# Patient Record
Sex: Male | Born: 1945 | Race: White | Hispanic: No | Marital: Married | Smoking: Current every day smoker
Health system: Southern US, Community
[De-identification: ages and names within clinical notes are randomized; demographics above are authoritative.]

## PROBLEM LIST (undated history)

## (undated) DIAGNOSIS — J449 Chronic obstructive pulmonary disease, unspecified: Secondary | ICD-10-CM

## (undated) DIAGNOSIS — I10 Essential (primary) hypertension: Secondary | ICD-10-CM

## (undated) DIAGNOSIS — I219 Acute myocardial infarction, unspecified: Secondary | ICD-10-CM

## (undated) DIAGNOSIS — R0600 Dyspnea, unspecified: Secondary | ICD-10-CM

## (undated) DIAGNOSIS — E785 Hyperlipidemia, unspecified: Secondary | ICD-10-CM

## (undated) DIAGNOSIS — M069 Rheumatoid arthritis, unspecified: Secondary | ICD-10-CM

## (undated) DIAGNOSIS — J45909 Unspecified asthma, uncomplicated: Secondary | ICD-10-CM

## (undated) DIAGNOSIS — G4730 Sleep apnea, unspecified: Secondary | ICD-10-CM

## (undated) DIAGNOSIS — E119 Type 2 diabetes mellitus without complications: Secondary | ICD-10-CM

## (undated) HISTORY — DX: Sleep apnea, unspecified: G47.30

## (undated) HISTORY — PX: CARDIAC CATHETERIZATION: SHX172

## (undated) HISTORY — PX: ELBOW ARTHROCENTESIS: SUR46

## (undated) HISTORY — PX: ROTATOR CUFF REPAIR: SHX139

## (undated) HISTORY — DX: Rheumatoid arthritis, unspecified: M06.9

## (undated) HISTORY — DX: Acute myocardial infarction, unspecified: I21.9

## (undated) HISTORY — DX: Unspecified asthma, uncomplicated: J45.909

## (undated) HISTORY — DX: Essential (primary) hypertension: I10

## (undated) HISTORY — DX: Type 2 diabetes mellitus without complications: E11.9

## (undated) HISTORY — PX: BACK SURGERY: SHX140

## (undated) HISTORY — DX: Hyperlipidemia, unspecified: E78.5

---

## 2001-05-03 ENCOUNTER — Encounter: Admission: RE | Admit: 2001-05-03 | Discharge: 2001-05-03 | Payer: Self-pay | Admitting: Neurosurgery

## 2001-05-03 ENCOUNTER — Encounter: Payer: Self-pay | Admitting: Neurosurgery

## 2001-05-17 ENCOUNTER — Encounter: Payer: Self-pay | Admitting: Neurosurgery

## 2001-05-17 ENCOUNTER — Encounter: Admission: RE | Admit: 2001-05-17 | Discharge: 2001-05-17 | Payer: Self-pay | Admitting: Neurosurgery

## 2003-02-23 ENCOUNTER — Ambulatory Visit (HOSPITAL_BASED_OUTPATIENT_CLINIC_OR_DEPARTMENT_OTHER): Admission: RE | Admit: 2003-02-23 | Discharge: 2003-02-23 | Payer: Self-pay | Admitting: Pulmonary Disease

## 2004-12-12 ENCOUNTER — Emergency Department: Payer: Self-pay | Admitting: Emergency Medicine

## 2006-02-27 ENCOUNTER — Emergency Department: Payer: Self-pay | Admitting: General Practice
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
## 2015-02-18 ENCOUNTER — Encounter: Payer: Self-pay | Admitting: Respiratory Therapy

## 2015-02-18 ENCOUNTER — Encounter: Payer: Non-veteran care | Attending: Family Medicine | Admitting: Respiratory Therapy

## 2015-02-18 VITALS — Ht 69.0 in | Wt 261.3 lb

## 2015-02-18 DIAGNOSIS — J449 Chronic obstructive pulmonary disease, unspecified: Secondary | ICD-10-CM | POA: Insufficient documentation

## 2015-02-18 DIAGNOSIS — E119 Type 2 diabetes mellitus without complications: Secondary | ICD-10-CM | POA: Insufficient documentation

## 2015-02-18 NOTE — Patient Instructions (Signed)
Patient Instructions  Patient Details  Name: Jeffrey Burgess MRN: [id number] Date of Birth: 01-11-1946 Referring Provider:  Concha Pyo, MD  Below are the personal goals you chose as well as exercise and nutrition goals. Our goal is to help you keep on track towards obtaining and maintaining your goals. We will be discussing your progress on these goals with you throughout the program.  Initial Exercise Prescription:     Initial Exercise Prescription - 02/18/15 1800    Date of Initial Exercise Prescription   Date 02/18/15   Treadmill   MPH 1   Grade 0   Minutes 10   Recumbant Bike   Level 2   RPM 40   Watts 20   Minutes 10   NuStep   Level 2   Watts 40   Minutes 10   Arm Ergometer   Level 1   Watts 10   Minutes 10   Recumbant Elliptical   Level 2   RPM 40   Watts 20   Minutes 10   REL-XR   Level 2   Watts 40   Minutes 10   Biostep-RELP   Level 2   Watts 40   Minutes 10   Prescription Details   Frequency (times per week) 3   Duration Progress to 30 minutes of continuous aerobic without signs/symptoms of physical distress   Intensity   THRR REST +  30   Ratings of Perceived Exertion 11-15   Perceived Dyspnea 2-4   Progression Continue progressive overload as per policy without signs/symptoms or physical distress.   Resistance Training   Training Prescription Yes   Weight 2   Reps 10-15      Exercise Goals: Frequency: Be able to perform aerobic exercise three times per week working toward 3-5 days per week.  Intensity: Work with a perceived exertion of 11 (fairly light) - 15 (hard) as tolerated. Follow your new exercise prescription and watch for changes in prescription as you progress with the program. Changes will be reviewed with you when they are made.  Duration: You should be able to do 30 minutes of continuous aerobic exercise in addition to a 5 minute warm-up and a 5 minute cool-down routine.  Nutrition Goals: Your personal nutrition goals will  be established when you do your nutrition analysis with the dietician.  The following are nutrition guidelines to follow: Cholesterol < 200mg /day Sodium < 1500mg /day Fiber: Men over 50 yrs - 30 grams per day  Personal Goals:     Personal Goals and Risk Factors at Admission - 02/18/15 1340    Personal Goals and Risk Factors on Admission    Weight Management Yes   Intervention Learn and follow the exercise and diet guidelines while in the program. Utilize the nutrition and education classes to help gain knowledge of the diet and exercise expectations in the program  Jeffrey Burgess is interested in meeting the dietitian. His wife does all the cooking - they like vegtables and salads. He does not eat much sweets, but he does not watch his portion sizes.   Admit Weight 260 lb (117.935 kg)   Goal Weight 200 lb (90.719 kg)   Increase Aerobic Exercise and Physical Activity Yes   Intervention While in program, learn and follow the exercise prescription taught. Start at a low level workload and increase workload after able to maintain previous level for 30 minutes. Increase time before increasing intensity.  Jeffrey Burgess wants to improve his exercise capacity. He likes to  do yardwork and wood working.   Quit Smoking Yes   Number of packs per day 2ppd for 73yrs; he quit a week ago - can not use chantix and uses no other nicotine aid.   Intervention Utilize your health care professional team to help with smoking cessation while in the program. Your doctor can prescribe medications to aid in cessation. The program can provide information and counseling as needed.   Understand more about Heart/Pulmonary Disease. Yes  Jeffrey Burgess would like to learn more about COPD and the daily management of the disease.   Intervention While in program utilize professionals for any questions, and attend the education sessions. Great websites to use are www.americanheart.org or www.lung.org for reliable information.  Reviewed Jeffrey  Burgess's MDIs: Albuterol, Spiriva Respimat, and Symbicort. Instructed on aerochamber and gave him a new chamber.   Improve shortness of breath with ADL's Yes   Intervention While in program, learn and follow the exercise prescription taught. Start at a low level workload and increase workload ad advised by the exercise physiologist. Increase time before increasing intensity.  Jeffrey Burgess would like to have a better understanding of his shortness of breath and less fear with it.    Develop more efficient breathing techniques such as purse lipped breathing and diaphragmatic breathing; and practicing self-pacing with activity Yes   Intervention While in program, learn and utilize the specific breathing techniques taught to you. Continue to practice and use the techniques as needed.  Instructed on PLB and it's benefits.   Increase knowledge of respiratory medications and ability to use respiratory devices properly.  Yes   Intervention While in program, learn to administer MDI, nebulizer, and spacer properly.;Learn to take respiratory medicine as ordered.;While in program, learn to Clean MDI, nebulizers, and spacers properly.  Reviewed MDI's and gave Jeffrey Burgess a spacer.   Diabetes Yes   Goal Blood glucose control identified by blood glucose values, HgbA1C. Participant verbalizes understanding of the signs/symptoms of hyper/hypo glycemia, proper foot care and importance of medication and nutrition plan for blood glucose control.   Intervention Provide nutrition & aerobic exercise along with prescribed medications to achieve blood glucose in normal ranges: Fasting 65-99 mg/dL   Hypertension Yes   Goal Participant will see blood pressure controlled within the values of 140/69mm/Hg or within value directed by their physician.   Intervention Provide nutrition & aerobic exercise along with prescribed medications to achieve BP 140/90 or less.      Tobacco Use Initial Evaluation: History  Smoking status  .  Former Smoker -- 1.00 packs/day for 57 years  . Types: Cigarettes  . Quit date: 02/10/2015  Smokeless tobacco  . 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

## 2015-02-18 NOTE — Progress Notes (Signed)
Pulmonary Individual Treatment Plan  Patient Details  Name: Jeffrey Burgess MRN: [id number] Date of Birth: 1945/11/11 Referring Provider:  Concha Pyo, MD  Initial Encounter Date: Date: 02/18/15  Visit Diagnosis: COPD, mild (HCC)  Patient's Home Medications on Admission: No current outpatient prescriptions on file.  Past Medical History: Past Medical History  Diagnosis Date  . Asthma   . Rheumatoid arthritis (HCC)   . Hyperlipidemia   . Hypertension   . Sleep apnea   . Myocardial infarction (HCC)   . Diabetes (HCC)     Tobacco Use: History  Smoking status  . Former Smoker -- 1.00 packs/day for 57 years  . Types: Cigarettes  . Quit date: 02/10/2015  Smokeless tobacco  . Former Emergency planning/management officer: Recent Review Flowsheet Data    There is no flowsheet data to display.       ADL UCSD:     ADL UCSD      02/18/15 1340       ADL UCSD   ADL Phase Entry     SOB Score total 65     Rest 1     Walk 2     Stairs 4     Bath 2     Dress 2     Shop 4         Pulmonary Function Assessment:     Pulmonary Function Assessment - 02/18/15 1340    Initial Spirometry Results   FVC% 62 %   FEV1% 43 %   FEV1/FVC Ratio 55.33   Post Bronchodilator Spirometry Results   FVC% 54 %   FEV1% 62 %   FEV1/FVC Ratio 71.51   Breath   Bilateral Breath Sounds Clear;Decreased   Shortness of Breath Yes;Limiting activity;Fear of Shortness of Breath      Exercise Target Goals: Date: 02/18/15  Exercise Program Goal: Individual exercise prescription set with THRR, safety & activity barriers. Participant demonstrates ability to understand and report RPE using BORG scale, to self-measure pulse accurately, and to acknowledge the importance of the exercise prescription.  Exercise Prescription Goal: Starting with aerobic activity 30 plus minutes a day, 3 days per week for initial exercise prescription. Provide home exercise prescription and guidelines that participant acknowledges  understanding prior to discharge.  Activity Barriers & Risk Stratification:     Activity Barriers & Risk Stratification - 02/18/15 1340    Activity Barriers & Risk Stratification   Activity Barriers Assistive Device;Deconditioning;Muscular Weakness;Balance Concerns   Risk Stratification Moderate      6 Minute Walk:     6 Minute Walk      02/18/15 1817       6 Minute Walk   Phase Initial     Distance 802 feet     Walk Time 6 minutes     Resting HR 88 bpm     Resting BP 136/72 mmHg     Max Ex. HR 86 bpm     Max Ex. BP 146/72 mmHg     RPE 14     Perceived Dyspnea  3     Symptoms No        Initial Exercise Prescription:     Initial Exercise Prescription - 02/18/15 1800    Date of Initial Exercise Prescription   Date 02/18/15   Treadmill   MPH 1   Grade 0   Minutes 10   Recumbant Bike   Level 2   RPM 40   Watts 20   Minutes 10  NuStep   Level 2   Watts 40   Minutes 10   Arm Ergometer   Level 1   Watts 10   Minutes 10   Recumbant Elliptical   Level 2   RPM 40   Watts 20   Minutes 10   REL-XR   Level 2   Watts 40   Minutes 10   Biostep-RELP   Level 2   Watts 40   Minutes 10   Prescription Details   Frequency (times per week) 3   Duration Progress to 30 minutes of continuous aerobic without signs/symptoms of physical distress   Intensity   THRR REST +  30   Ratings of Perceived Exertion 11-15   Perceived Dyspnea 2-4   Progression Continue progressive overload as per policy without signs/symptoms or physical distress.   Resistance Training   Training Prescription Yes   Weight 2   Reps 10-15      Exercise Prescription Changes:   Discharge Exercise Prescription (Final Exercise Prescription Changes):    Nutrition:  Target Goals: Understanding of nutrition guidelines, daily intake of sodium 1500mg , cholesterol 200mg , calories 30% from fat and 7% or less from saturated fats, daily to have 5 or more servings of fruits and  vegetables.  Biometrics:     Pre Biometrics - 02/18/15 1819    Pre Biometrics   Height 5\' 9"  (1.753 m)   Weight 261 lb 4.8 oz (118.525 kg)   Waist Circumference 48 inches   Hip Circumference 53.5 inches   Waist to Hip Ratio 0.9 %   BMI (Calculated) 38.7       Nutrition Therapy Plan and Nutrition Goals:     Nutrition Therapy & Goals - 02/18/15 1340    Nutrition Therapy   Diet Mr Chakraborty is interested in meeting the dietitian. His wife does all the cooking - they like vegtables and salads. He does not eat much sweets, but he does not watch his portion sizes.      Nutrition Discharge: Rate Your Plate Scores:   Psychosocial: Target Goals: Acknowledge presence or absence of depression, maximize coping skills, provide positive support system. Participant is able to verbalize types and ability to use techniques and skills needed for reducing stress and depression.  Initial Review & Psychosocial Screening:     Initial Psych Review & Screening - 02/18/15 1340    Family Dynamics   Good Support System? Yes   Comments Mr Lelli has good family support. He states he misses the activity he use to do before the COPD, but he is not depressed.   Barriers   Psychosocial barriers to participate in program There are no identifiable barriers or psychosocial needs.;The patient should benefit from training in stress management and relaxation.   Screening Interventions   Interventions Encouraged to exercise      Quality of Life Scores:     Quality of Life - 02/18/15 1340    Quality of Life Scores   Health/Function Pre 13.13 %   Socioeconomic Pre 28.44 %   Psych/Spiritual Pre 30 %   Family Pre 25.2 %   GLOBAL Pre 21.49 %      PHQ-9:     Recent Review Flowsheet Data    Depression screen [facility] 2/9 02/18/2015   Decreased Interest 0   Down, Depressed, Hopeless 0   PHQ - 2 Score 0      Psychosocial Evaluation and Intervention:   Psychosocial  Re-Evaluation:  Education: Education Goals: Education classes will be provided  on a weekly basis, covering required topics. Participant will state understanding/return demonstration of topics presented.  Learning Barriers/Preferences:     Learning Barriers/Preferences - 02/18/15 1340    Learning Barriers/Preferences   Learning Barriers None   Learning Preferences Group Instruction;Individual Instruction;Pictoral;Skilled Demonstration;Written Material;Verbal Instruction;Video      Education Topics: Initial Evaluation Education: - Verbal, written and demonstration of respiratory meds, RPE/PD scales, oximetry and breathing techniques. Instruction on use of nebulizers and MDIs: cleaning and proper use, rinsing mouth with steroid doses and importance of monitoring MDI activations.          Pulmonary Rehab from 02/18/2015 in [facility]   Date  02/18/15   Educator  LB   Instruction Review Code  2- meets goals/outcomes      General Nutrition Guidelines/Fats and Fiber: -Group instruction provided by verbal, written material, models and posters to present the general guidelines for heart healthy nutrition. Gives an explanation and review of dietary fats and fiber.   Controlling Sodium/Reading Food Labels: -Group verbal and written material supporting the discussion of sodium use in heart healthy nutrition. Review and explanation with models, verbal and written materials for utilization of the food label.   Exercise Physiology & Risk Factors: - Group verbal and written instruction with models to review the exercise physiology of the cardiovascular system and associated critical values. Details cardiovascular disease risk factors and the goals associated with each risk factor.   Aerobic Exercise & Resistance Training: - Gives group verbal and written discussion on the health impact of inactivity. On the components of aerobic and resistive training  programs and the benefits of this training and how to safely progress through these programs.   Flexibility, Balance, General Exercise Guidelines: - Provides group verbal and written instruction on the benefits of flexibility and balance training programs. Provides general exercise guidelines with specific guidelines to those with heart or lung disease. Demonstration and skill practice provided.   Stress Management: - Provides group verbal and written instruction about the health risks of elevated stress, cause of high stress, and healthy ways to reduce stress.   Depression: - Provides group verbal and written instruction on the correlation between heart/lung disease and depressed mood, treatment options, and the stigmas associated with seeking treatment.   Exercise & Equipment Safety: - Individual verbal instruction and demonstration of equipment use and safety with use of the equipment.   Infection Prevention: - Provides verbal and written material to individual with discussion of infection control including proper hand washing and proper equipment cleaning during exercise session.      Pulmonary Rehab from 02/18/2015 in [facility]   Date  02/18/15   Educator  LB   Instruction Review Code  2- meets goals/outcomes      Falls Prevention: - Provides verbal and written material to individual with discussion of falls prevention and safety.      Pulmonary Rehab from 02/18/2015 in [facility]   Date  02/18/15   Educator  LB   Instruction Review Code  2- meets goals/outcomes      Diabetes: - Individual verbal and written instruction to review signs/symptoms of diabetes, desired ranges of glucose level fasting, after meals and with exercise. Advice that pre and post exercise glucose checks will be done for 3 sessions at entry of program.   Chronic Lung Diseases: - Group verbal and written instruction to  review new updates, new respiratory medications, new advancements in  procedures and treatments. Provide informative websites and "800" numbers of self-education.   Lung Procedures: - Group verbal and written instruction to describe testing methods done to diagnose lung disease. Review the outcome of test results. Describe the treatment choices: Pulmonary Function Tests, ABGs and oximetry.   Energy Conservation: - Provide group verbal and written instruction for methods to conserve energy, plan and organize activities. Instruct on pacing techniques, use of adaptive equipment and posture/positioning to relieve shortness of breath.   Triggers: - Group verbal and written instruction to review types of environmental controls: home humidity, furnaces, filters, dust mite/pet prevention, HEPA vacuums. To discuss weather changes, air quality and the benefits of nasal washing.   Exacerbations: - Group verbal and written instruction to provide: warning signs, infection symptoms, calling MD promptly, preventive modes, and value of vaccinations. Review: effective airway clearance, coughing and/or vibration techniques. Create an Sport and exercise psychologist.   Oxygen: - Individual and group verbal and written instruction on oxygen therapy. Includes supplement oxygen, available portable oxygen systems, continuous and intermittent flow rates, oxygen safety, concentrators, and Medicare reimbursement for oxygen.   Respiratory Medications: - Group verbal and written instruction to review medications for lung disease. Drug class, frequency, complications, importance of spacers, rinsing mouth after steroid MDI's, and proper cleaning methods for nebulizers.      Pulmonary Rehab from 02/18/2015 in [facility]   Date  02/18/15   Educator  LB   Instruction Review Code  2- meets goals/outcomes      AED/CPR: - Group verbal and written instruction with the use of models to demonstrate the  basic use of the AED with the basic ABC's of resuscitation.   Breathing Retraining: - Provides individuals verbal and written instruction on purpose, frequency, and proper technique of diaphragmatic breathing and pursed-lipped breathing. Applies individual practice skills.      Pulmonary Rehab from 02/18/2015 in [facility]   Date  02/18/15   Educator  LB   Instruction Review Code  2- meets goals/outcomes      Anatomy and Physiology of the Lungs: - Group verbal and written instruction with the use of models to provide basic lung anatomy and physiology related to function, structure and complications of lung disease.   Heart Failure: - Group verbal and written instruction on the basics of heart failure: signs/symptoms, treatments, explanation of ejection fraction, enlarged heart and cardiomyopathy.   Sleep Apnea: - Individual verbal and written instruction to review Obstructive Sleep Apnea. Review of risk factors, methods for diagnosing and types of masks and machines for OSA.      Pulmonary Rehab from 02/18/2015 in [facility]   Date  02/18/15   Educator  LB   Instruction Review Code  2- meets goals/outcomes      Anxiety: - Provides group, verbal and written instruction on the correlation between heart/lung disease and anxiety, treatment options, and management of anxiety.   Relaxation: - Provides group, verbal and written instruction about the benefits of relaxation for patients with heart/lung disease. Also provides patients with examples of relaxation techniques.   Knowledge Questionnaire Score:     Knowledge Questionnaire Score - 02/18/15 1340    Knowledge Questionnaire Score   Pre Score -2      Personal Goals and Risk Factors at Admission:     Personal Goals and Risk Factors at Admission - 02/18/15 1340    Personal Goals and Risk Factors on Admission    Weight  Management Yes    Intervention Learn and follow the exercise and diet guidelines while in the program. Utilize the nutrition and education classes to help gain knowledge of the diet and exercise expectations in the program  Mr Zeiders is interested in meeting the dietitian. His wife does all the cooking - they like vegtables and salads. He does not eat much sweets, but he does not watch his portion sizes.   Admit Weight 260 lb (117.935 kg)   Goal Weight 200 lb (90.719 kg)   Increase Aerobic Exercise and Physical Activity Yes   Intervention While in program, learn and follow the exercise prescription taught. Start at a low level workload and increase workload after able to maintain previous level for 30 minutes. Increase time before increasing intensity.  Mr Teegarden wants to improve his exercise capacity. He likes to do yardwork and wood working.   Quit Smoking Yes   Number of packs per day 2ppd for 80yrs; he quit a week ago - can not use chantix and uses no other nicotine aid.   Intervention Utilize your health care professional team to help with smoking cessation while in the program. Your doctor can prescribe medications to aid in cessation. The program can provide information and counseling as needed.   Understand more about Heart/Pulmonary Disease. Yes  Mr Jackson would like to learn more about COPD and the daily management of the disease.   Intervention While in program utilize professionals for any questions, and attend the education sessions. Great websites to use are www.americanheart.org or www.lung.org for reliable information.  Reviewed Mr Chinn's MDIs: Albuterol, Spiriva Respimat, and Symbicort. Instructed on aerochamber and gave him a new chamber.   Improve shortness of breath with ADL's Yes   Intervention While in program, learn and follow the exercise prescription taught. Start at a low level workload and increase workload ad advised by the exercise physiologist. Increase time before increasing intensity.   Mr Odette would like to have a better understanding of his shortness of breath and less fear with it.    Develop more efficient breathing techniques such as purse lipped breathing and diaphragmatic breathing; and practicing self-pacing with activity Yes   Intervention While in program, learn and utilize the specific breathing techniques taught to you. Continue to practice and use the techniques as needed.  Instructed on PLB and it's benefits.   Increase knowledge of respiratory medications and ability to use respiratory devices properly.  Yes   Intervention While in program, learn to administer MDI, nebulizer, and spacer properly.;Learn to take respiratory medicine as ordered.;While in program, learn to Clean MDI, nebulizers, and spacers properly.  Reviewed MDI's and gave Mr Lutes a spacer.   Diabetes Yes   Goal Blood glucose control identified by blood glucose values, HgbA1C. Participant verbalizes understanding of the signs/symptoms of hyper/hypo glycemia, proper foot care and importance of medication and nutrition plan for blood glucose control.   Intervention Provide nutrition & aerobic exercise along with prescribed medications to achieve blood glucose in normal ranges: Fasting 65-99 mg/dL   Hypertension Yes   Goal Participant will see blood pressure controlled within the values of 140/83mm/Hg or within value directed by their physician.   Intervention Provide nutrition & aerobic exercise along with prescribed medications to achieve BP 140/90 or less.      Personal Goals and Risk Factors Review:    Personal Goals Discharge (Final Personal Goals and Risk Factors Review):    ITP Comments:   Comments:

## 2015-02-19 NOTE — Progress Notes (Signed)
Pulmonary Individual Treatment Plan  Patient Details  Name: Jeffrey Burgess MRN: [id number] Date of Birth: Mar 07, 1945 Referring Provider:  Concha Pyo, MD; [facility]  Initial Encounter Date: Date: 02/18/15  Visit Diagnosis: COPD, mild (HCC) - Plan: PULMONARY REHAB 30 DAY REVIEW  Patient's Home Medications on Admission:  Current outpatient prescriptions:    acetaminophen (TYLENOL) 500 MG tablet, Take 500 mg by mouth., Disp: , Rfl:    albuterol (PROAIR HFA) 108 (90 Base) MCG/ACT inhaler, Inhale 2 puffs into the lungs., Disp: , Rfl:    aspirin EC 81 MG tablet, Take 81 mg by mouth., Disp: , Rfl:    beclomethasone (BECONASE-AQ) 42 MCG/SPRAY nasal spray, Place 2 sprays into the nose., Disp: , Rfl:    budesonide-formoterol (SYMBICORT) 160-4.5 MCG/ACT inhaler, Inhale 2 puffs into the lungs., Disp: , Rfl:    buPROPion (WELLBUTRIN) 100 MG tablet, Take 100 mg by mouth., Disp: , Rfl:    busPIRone (BUSPAR) 10 MG tablet, Take 10 mg by mouth., Disp: , Rfl:    Cholecalciferol (VITAMIN D3 SUPER STRENGTH) 2000 units TABS, Take 2,000 Units by mouth., Disp: , Rfl:    docusate sodium (STOOL SOFTENER) 100 MG capsule, Take 100 mg by mouth., Disp: , Rfl:    fluticasone (FLONASE) 50 MCG/ACT nasal spray, Place 1 spray into the nose., Disp: , Rfl:    insulin aspart protamine - aspart (NOVOLOG 70/30 MIX) (70-30) 100 UNIT/ML FlexPen, Inject 18 Units into the skin., Disp: , Rfl:    metFORMIN (GLUCOPHAGE) 500 MG tablet, Take 500 mg by mouth., Disp: , Rfl:    metoprolol tartrate (LOPRESSOR) 25 MG tablet, Take 25 mg by mouth., Disp: , Rfl:    montelukast (SINGULAIR) 10 MG tablet, Take 10 mg by mouth., Disp: , Rfl:    nitroGLYCERIN (NITROSTAT) 0.4 MG SL tablet, Place 0.4 mg under the tongue., Disp: , Rfl:    Omega-3 1000 MG CAPS, Take 2 g by mouth., Disp: , Rfl:    pramipexole (MIRAPEX) 0.5 MG tablet, Take 0.5 mg by mouth., Disp: , Rfl:    pregabalin (LYRICA) 100 MG capsule, Take 100 mg by mouth., Disp: ,  Rfl:    ranitidine (ZANTAC) 150 MG capsule, Take 150 mg by mouth., Disp: , Rfl:    rosuvastatin (CRESTOR) 20 MG tablet, Take 20 mg by mouth., Disp: , Rfl:    tiotropium (SPIRIVA) 18 MCG inhalation capsule, Place 18 mcg into inhaler and inhale., Disp: , Rfl:    VITAMIN B COMPLEX-C CAPS, Take 1 tablet by mouth., Disp: , Rfl:    vitamin E 400 UNIT capsule, Take 400 Units by mouth., Disp: , Rfl:   Past Medical History: Past Medical History  Diagnosis Date   Asthma    Rheumatoid arthritis (HCC)    Hyperlipidemia    Hypertension    Sleep apnea    Myocardial infarction (HCC)    Diabetes (HCC)     Tobacco Use: History  Smoking status   Former Smoker -- 1.00 packs/day for 57 years   Types: Cigarettes   Quit date: 02/10/2015  Smokeless tobacco   Former Neurosurgeon    Labs: Recent Review Flowsheet Data    There is no flowsheet data to display.       ADL UCSD:     ADL UCSD      02/18/15 1340       ADL UCSD   ADL Phase Entry     SOB Score total 65     Rest 1     Walk  2     Stairs 4     Bath 2     Dress 2     Shop 4         Pulmonary Function Assessment:     Pulmonary Function Assessment - 02/18/15 1340    Initial Spirometry Results   FVC% 62 %   FEV1% 43 %   FEV1/FVC Ratio 55.33   Post Bronchodilator Spirometry Results   FVC% 54 %   FEV1% 62 %   FEV1/FVC Ratio 71.51   Breath   Bilateral Breath Sounds Clear;Decreased   Shortness of Breath Yes;Limiting activity;Fear of Shortness of Breath      Exercise Target Goals: Date: 02/18/15  Exercise Program Goal: Individual exercise prescription set with THRR, safety & activity barriers. Participant demonstrates ability to understand and report RPE using BORG scale, to self-measure pulse accurately, and to acknowledge the importance of the exercise prescription.  Exercise Prescription Goal: Starting with aerobic activity 30 plus minutes a day, 3 days per week for initial exercise prescription. Provide  home exercise prescription and guidelines that participant acknowledges understanding prior to discharge.  Activity Barriers & Risk Stratification:     Activity Barriers & Risk Stratification - 02/18/15 1340    Activity Barriers & Risk Stratification   Activity Barriers Assistive Device;Deconditioning;Muscular Weakness;Balance Concerns   Risk Stratification Moderate      6 Minute Walk:     6 Minute Walk      02/18/15 1817       6 Minute Walk   Phase Initial     Distance 802 feet     Walk Time 6 minutes     Resting HR 88 bpm     Resting BP 136/72 mmHg     Max Ex. HR 86 bpm     Max Ex. BP 146/72 mmHg     RPE 14     Perceived Dyspnea  3     Symptoms No        Initial Exercise Prescription:     Initial Exercise Prescription - 02/18/15 1800    Date of Initial Exercise Prescription   Date 02/18/15   Treadmill   MPH 1   Grade 0   Minutes 10   Recumbant Bike   Level 2   RPM 40   Watts 20   Minutes 10   NuStep   Level 2   Watts 40   Minutes 10   Arm Ergometer   Level 1   Watts 10   Minutes 10   Recumbant Elliptical   Level 2   RPM 40   Watts 20   Minutes 10   REL-XR   Level 2   Watts 40   Minutes 10   Biostep-RELP   Level 2   Watts 40   Minutes 10   Prescription Details   Frequency (times per week) 3   Duration Progress to 30 minutes of continuous aerobic without signs/symptoms of physical distress   Intensity   THRR REST +  30   Ratings of Perceived Exertion 11-15   Perceived Dyspnea 2-4   Progression Continue progressive overload as per policy without signs/symptoms or physical distress.   Resistance Training   Training Prescription Yes   Weight 2   Reps 10-15      Exercise Prescription Changes:   Discharge Exercise Prescription (Final Exercise Prescription Changes):    Nutrition:  Target Goals: Understanding of nutrition guidelines, daily intake of sodium 1500mg , cholesterol 200mg , calories 30% from fat  and 7% or less from  saturated fats, daily to have 5 or more servings of fruits and vegetables.  Biometrics:     Pre Biometrics - 02/18/15 1819    Pre Biometrics   Height 5\' 9"  (1.753 m)   Weight 261 lb 4.8 oz (118.525 kg)   Waist Circumference 48 inches   Hip Circumference 53.5 inches   Waist to Hip Ratio 0.9 %   BMI (Calculated) 38.7       Nutrition Therapy Plan and Nutrition Goals:     Nutrition Therapy & Goals - 02/18/15 1340    Nutrition Therapy   Diet Mr Durnan is interested in meeting the dietitian. His wife does all the cooking - they like vegtables and salads. He does not eat much sweets, but he does not watch his portion sizes.      Nutrition Discharge: Rate Your Plate Scores:   Psychosocial: Target Goals: Acknowledge presence or absence of depression, maximize coping skills, provide positive support system. Participant is able to verbalize types and ability to use techniques and skills needed for reducing stress and depression.  Initial Review & Psychosocial Screening:     Initial Psych Review & Screening - 02/18/15 1340    Family Dynamics   Good Support System? Yes   Comments Mr Sthilaire has good family support. He states he misses the activity he use to do before the COPD, but he is not depressed.   Barriers   Psychosocial barriers to participate in program There are no identifiable barriers or psychosocial needs.;The patient should benefit from training in stress management and relaxation.   Screening Interventions   Interventions Encouraged to exercise      Quality of Life Scores:     Quality of Life - 02/18/15 1340    Quality of Life Scores   Health/Function Pre 13.13 %   Socioeconomic Pre 28.44 %   Psych/Spiritual Pre 30 %   Family Pre 25.2 %   GLOBAL Pre 21.49 %      PHQ-9:     Recent Review Flowsheet Data    Depression screen [facility] 2/9 02/18/2015   Decreased Interest 0   Down, Depressed, Hopeless 0   PHQ - 2 Score 0      Psychosocial Evaluation and  Intervention:   Psychosocial Re-Evaluation:  Education: Education Goals: Education classes will be provided on a weekly basis, covering required topics. Participant will state understanding/return demonstration of topics presented.  Learning Barriers/Preferences:     Learning Barriers/Preferences - 02/18/15 1340    Learning Barriers/Preferences   Learning Barriers None   Learning Preferences Group Instruction;Individual Instruction;Pictoral;Skilled Demonstration;Written Material;Verbal Instruction;Video      Education Topics: Initial Evaluation Education: - Verbal, written and demonstration of respiratory meds, RPE/PD scales, oximetry and breathing techniques. Instruction on use of nebulizers and MDIs: cleaning and proper use, rinsing mouth with steroid doses and importance of monitoring MDI activations.          Pulmonary Rehab from 02/18/2015 in [facility]   Date  02/18/15   Educator  LB   Instruction Review Code  2- meets goals/outcomes      General Nutrition Guidelines/Fats and Fiber: -Group instruction provided by verbal, written material, models and posters to present the general guidelines for heart healthy nutrition. Gives an explanation and review of dietary fats and fiber.   Controlling Sodium/Reading Food Labels: -Group verbal and written material supporting the discussion of sodium use in heart healthy nutrition. Review and explanation with models,  verbal and written materials for utilization of the food label.   Exercise Physiology & Risk Factors: - Group verbal and written instruction with models to review the exercise physiology of the cardiovascular system and associated critical values. Details cardiovascular disease risk factors and the goals associated with each risk factor.   Aerobic Exercise & Resistance Training: - Gives group verbal and written discussion on the health impact of inactivity. On the components of  aerobic and resistive training programs and the benefits of this training and how to safely progress through these programs.   Flexibility, Balance, General Exercise Guidelines: - Provides group verbal and written instruction on the benefits of flexibility and balance training programs. Provides general exercise guidelines with specific guidelines to those with heart or lung disease. Demonstration and skill practice provided.   Stress Management: - Provides group verbal and written instruction about the health risks of elevated stress, cause of high stress, and healthy ways to reduce stress.   Depression: - Provides group verbal and written instruction on the correlation between heart/lung disease and depressed mood, treatment options, and the stigmas associated with seeking treatment.   Exercise & Equipment Safety: - Individual verbal instruction and demonstration of equipment use and safety with use of the equipment.   Infection Prevention: - Provides verbal and written material to individual with discussion of infection control including proper hand washing and proper equipment cleaning during exercise session.      Pulmonary Rehab from 02/18/2015 in [facility]   Date  02/18/15   Educator  LB   Instruction Review Code  2- meets goals/outcomes      Falls Prevention: - Provides verbal and written material to individual with discussion of falls prevention and safety.      Pulmonary Rehab from 02/18/2015 in [facility]   Date  02/18/15   Educator  LB   Instruction Review Code  2- meets goals/outcomes      Diabetes: - Individual verbal and written instruction to review signs/symptoms of diabetes, desired ranges of glucose level fasting, after meals and with exercise. Advice that pre and post exercise glucose checks will be done for 3 sessions at entry of program.   Chronic Lung Diseases: - Group verbal  and written instruction to review new updates, new respiratory medications, new advancements in procedures and treatments. Provide informative websites and "800" numbers of self-education.   Lung Procedures: - Group verbal and written instruction to describe testing methods done to diagnose lung disease. Review the outcome of test results. Describe the treatment choices: Pulmonary Function Tests, ABGs and oximetry.   Energy Conservation: - Provide group verbal and written instruction for methods to conserve energy, plan and organize activities. Instruct on pacing techniques, use of adaptive equipment and posture/positioning to relieve shortness of breath.   Triggers: - Group verbal and written instruction to review types of environmental controls: home humidity, furnaces, filters, dust mite/pet prevention, HEPA vacuums. To discuss weather changes, air quality and the benefits of nasal washing.   Exacerbations: - Group verbal and written instruction to provide: warning signs, infection symptoms, calling MD promptly, preventive modes, and value of vaccinations. Review: effective airway clearance, coughing and/or vibration techniques. Create an Sport and exercise psychologist.   Oxygen: - Individual and group verbal and written instruction on oxygen therapy. Includes supplement oxygen, available portable oxygen systems, continuous and intermittent flow rates, oxygen safety, concentrators, and Medicare reimbursement for oxygen.   Respiratory Medications: - Group verbal and written instruction  to review medications for lung disease. Drug class, frequency, complications, importance of spacers, rinsing mouth after steroid MDI's, and proper cleaning methods for nebulizers.      Pulmonary Rehab from 02/18/2015 in [facility]   Date  02/18/15   Educator  LB   Instruction Review Code  2- meets goals/outcomes      AED/CPR: - Group verbal and written instruction with the use of  models to demonstrate the basic use of the AED with the basic ABC's of resuscitation.   Breathing Retraining: - Provides individuals verbal and written instruction on purpose, frequency, and proper technique of diaphragmatic breathing and pursed-lipped breathing. Applies individual practice skills.      Pulmonary Rehab from 02/18/2015 in [facility]   Date  02/18/15   Educator  LB   Instruction Review Code  2- meets goals/outcomes      Anatomy and Physiology of the Lungs: - Group verbal and written instruction with the use of models to provide basic lung anatomy and physiology related to function, structure and complications of lung disease.   Heart Failure: - Group verbal and written instruction on the basics of heart failure: signs/symptoms, treatments, explanation of ejection fraction, enlarged heart and cardiomyopathy.   Sleep Apnea: - Individual verbal and written instruction to review Obstructive Sleep Apnea. Review of risk factors, methods for diagnosing and types of masks and machines for OSA.      Pulmonary Rehab from 02/18/2015 in [facility]   Date  02/18/15   Educator  LB   Instruction Review Code  2- meets goals/outcomes      Anxiety: - Provides group, verbal and written instruction on the correlation between heart/lung disease and anxiety, treatment options, and management of anxiety.   Relaxation: - Provides group, verbal and written instruction about the benefits of relaxation for patients with heart/lung disease. Also provides patients with examples of relaxation techniques.   Knowledge Questionnaire Score:     Knowledge Questionnaire Score - 02/18/15 1340    Knowledge Questionnaire Score   Pre Score -2      Personal Goals and Risk Factors at Admission:     Personal Goals and Risk Factors at Admission - 02/18/15 1340    Personal Goals and Risk Factors on Admission    Weight  Management Yes   Intervention Learn and follow the exercise and diet guidelines while in the program. Utilize the nutrition and education classes to help gain knowledge of the diet and exercise expectations in the program  Mr Collier is interested in meeting the dietitian. His wife does all the cooking - they like vegtables and salads. He does not eat much sweets, but he does not watch his portion sizes.   Admit Weight 260 lb (117.935 kg)   Goal Weight 200 lb (90.719 kg)   Increase Aerobic Exercise and Physical Activity Yes   Intervention While in program, learn and follow the exercise prescription taught. Start at a low level workload and increase workload after able to maintain previous level for 30 minutes. Increase time before increasing intensity.  Mr Lesch wants to improve his exercise capacity. He likes to do yardwork and wood working.   Quit Smoking Yes   Number of packs per day 2ppd for 49yrs; he quit a week ago - can not use chantix and uses no other nicotine aid.   Intervention Utilize your health care professional team to help with smoking cessation while in  the program. Your doctor can prescribe medications to aid in cessation. The program can provide information and counseling as needed.   Understand more about Heart/Pulmonary Disease. Yes  Mr Zanders would like to learn more about COPD and the daily management of the disease.   Intervention While in program utilize professionals for any questions, and attend the education sessions. Great websites to use are www.americanheart.org or www.lung.org for reliable information.  Reviewed Mr Burkholder's MDIs: Albuterol, Spiriva Respimat, and Symbicort. Instructed on aerochamber and gave him a new chamber.   Improve shortness of breath with ADL's Yes   Intervention While in program, learn and follow the exercise prescription taught. Start at a low level workload and increase workload ad advised by the exercise physiologist. Increase time before  increasing intensity.  Mr Dauphin would like to have a better understanding of his shortness of breath and less fear with it.    Develop more efficient breathing techniques such as purse lipped breathing and diaphragmatic breathing; and practicing self-pacing with activity Yes   Intervention While in program, learn and utilize the specific breathing techniques taught to you. Continue to practice and use the techniques as needed.  Instructed on PLB and it's benefits.   Increase knowledge of respiratory medications and ability to use respiratory devices properly.  Yes   Intervention While in program, learn to administer MDI, nebulizer, and spacer properly.;Learn to take respiratory medicine as ordered.;While in program, learn to Clean MDI, nebulizers, and spacers properly.  Reviewed MDI's and gave Mr Montelongo a spacer.   Diabetes Yes   Goal Blood glucose control identified by blood glucose values, HgbA1C. Participant verbalizes understanding of the signs/symptoms of hyper/hypo glycemia, proper foot care and importance of medication and nutrition plan for blood glucose control.   Intervention Provide nutrition & aerobic exercise along with prescribed medications to achieve blood glucose in normal ranges: Fasting 65-99 mg/dL   Hypertension Yes   Goal Participant will see blood pressure controlled within the values of 140/64mm/Hg or within value directed by their physician.   Intervention Provide nutrition & aerobic exercise along with prescribed medications to achieve BP 140/90 or less.      Personal Goals and Risk Factors Review:    Personal Goals Discharge (Final Personal Goals and Risk Factors Review):    ITP Comments:   Comments: Mr Portlock plans to start [name] on 02/25/2014 and exercise 3 days/week.

## 2015-02-26 ENCOUNTER — Encounter: Payer: Non-veteran care | Attending: Family Medicine | Admitting: *Deleted

## 2015-02-26 DIAGNOSIS — J449 Chronic obstructive pulmonary disease, unspecified: Secondary | ICD-10-CM | POA: Diagnosis present

## 2015-02-26 LAB — GLUCOSE, CAPILLARY: GLUCOSE-CAPILLARY: 193 mg/dL — AB (ref 65–99)

## 2015-02-26 NOTE — Progress Notes (Signed)
Pulmonary Individual Treatment Plan  Patient Details  Name: Jeffrey Burgess MRN: [id number] Date of Birth: [birth year] Referring Provider:  Concha Pyo, MD  Initial Encounter Date:    Visit Diagnosis: Chronic obstructive pulmonary disease, unspecified COPD type (HCC)  Patient's Home Medications on Admission:  Current outpatient prescriptions:  .  acetaminophen (TYLENOL) 500 MG tablet, Take 500 mg by mouth., Disp: , Rfl:  .  albuterol (PROAIR HFA) 108 (90 Base) MCG/ACT inhaler, Inhale 2 puffs into the lungs., Disp: , Rfl:  .  aspirin EC 81 MG tablet, Take 81 mg by mouth., Disp: , Rfl:  .  beclomethasone (BECONASE-AQ) 42 MCG/SPRAY nasal spray, Place 2 sprays into the nose., Disp: , Rfl:  .  budesonide-formoterol (SYMBICORT) 160-4.5 MCG/ACT inhaler, Inhale 2 puffs into the lungs., Disp: , Rfl:  .  buPROPion (WELLBUTRIN) 100 MG tablet, Take 100 mg by mouth., Disp: , Rfl:  .  busPIRone (BUSPAR) 10 MG tablet, Take 10 mg by mouth., Disp: , Rfl:  .  Cholecalciferol (VITAMIN D3 SUPER STRENGTH) 2000 units TABS, Take 2,000 Units by mouth., Disp: , Rfl:  .  docusate sodium (STOOL SOFTENER) 100 MG capsule, Take 100 mg by mouth., Disp: , Rfl:  .  fluticasone (FLONASE) 50 MCG/ACT nasal spray, Place 1 spray into the nose., Disp: , Rfl:  .  insulin aspart protamine - aspart (NOVOLOG 70/30 MIX) (70-30) 100 UNIT/ML FlexPen, Inject 18 Units into the skin., Disp: , Rfl:  .  metFORMIN (GLUCOPHAGE) 500 MG tablet, Take 500 mg by mouth., Disp: , Rfl:  .  metoprolol tartrate (LOPRESSOR) 25 MG tablet, Take 25 mg by mouth., Disp: , Rfl:  .  montelukast (SINGULAIR) 10 MG tablet, Take 10 mg by mouth., Disp: , Rfl:  .  nitroGLYCERIN (NITROSTAT) 0.4 MG SL tablet, Place 0.4 mg under the tongue., Disp: , Rfl:  .  Omega-3 1000 MG CAPS, Take 2 g by mouth., Disp: , Rfl:  .  pramipexole (MIRAPEX) 0.5 MG tablet, Take 0.5 mg by mouth., Disp: , Rfl:  .  pregabalin (LYRICA) 100 MG capsule, Take 100 mg by mouth., Disp: , Rfl:   .  ranitidine (ZANTAC) 150 MG capsule, Take 150 mg by mouth., Disp: , Rfl:  .  rosuvastatin (CRESTOR) 20 MG tablet, Take 20 mg by mouth., Disp: , Rfl:  .  tiotropium (SPIRIVA) 18 MCG inhalation capsule, Place 18 mcg into inhaler and inhale., Disp: , Rfl:  .  VITAMIN B COMPLEX-C CAPS, Take 1 tablet by mouth., Disp: , Rfl:  .  vitamin E 400 UNIT capsule, Take 400 Units by mouth., Disp: , Rfl:   Past Medical History: Past Medical History  Diagnosis Date  . Asthma   . Rheumatoid arthritis (HCC)   . Hyperlipidemia   . Hypertension   . Sleep apnea   . Myocardial infarction (HCC)   . Diabetes (HCC)     Tobacco Use: History  Smoking status  . Former Smoker -- 1.00 packs/day for 57 years  . Types: Cigarettes  . Quit date: 02/10/2015  Smokeless tobacco  . Former Emergency planning/management officer: Recent Review Flowsheet Data    There is no flowsheet data to display.       ADL UCSD:     ADL UCSD      02/18/15 1340       ADL UCSD   ADL Phase Entry     SOB Score total 65     Rest 1     Walk 2  Stairs 4     Bath 2     Dress 2     Shop 4         Pulmonary Function Assessment:     Pulmonary Function Assessment - 02/18/15 1340    Initial Spirometry Results   FVC% 62 %   FEV1% 43 %   FEV1/FVC Ratio 55.33   Post Bronchodilator Spirometry Results   FVC% 54 %   FEV1% 62 %   FEV1/FVC Ratio 71.51   Breath   Bilateral Breath Sounds Clear;Decreased   Shortness of Breath Yes;Limiting activity;Fear of Shortness of Breath      Exercise Target Goals:    Exercise Program Goal: Individual exercise prescription set with THRR, safety & activity barriers. Participant demonstrates ability to understand and report RPE using BORG scale, to self-measure pulse accurately, and to acknowledge the importance of the exercise prescription.  Exercise Prescription Goal: Starting with aerobic activity 30 plus minutes a day, 3 days per week for initial exercise prescription. Provide home exercise  prescription and guidelines that participant acknowledges understanding prior to discharge.  Activity Barriers & Risk Stratification:     Activity Barriers & Risk Stratification - 02/18/15 1340    Activity Barriers & Risk Stratification   Activity Barriers Assistive Device;Deconditioning;Muscular Weakness;Balance Concerns   Risk Stratification Moderate      6 Minute Walk:     6 Minute Walk      02/18/15 1817       6 Minute Walk   Phase Initial     Distance 802 feet     Walk Time 6 minutes     Resting HR 88 bpm     Resting BP 136/72 mmHg     Max Ex. HR 86 bpm     Max Ex. BP 146/72 mmHg     RPE 14     Perceived Dyspnea  3     Symptoms No        Initial Exercise Prescription:     Initial Exercise Prescription - 02/18/15 1800    Date of Initial Exercise Prescription   Date 02/18/15   Treadmill   MPH 1   Grade 0   Minutes 10   Recumbant Bike   Level 2   RPM 40   Watts 20   Minutes 10   NuStep   Level 2   Watts 40   Minutes 10   Arm Ergometer   Level 1   Watts 10   Minutes 10   Recumbant Elliptical   Level 2   RPM 40   Watts 20   Minutes 10   REL-XR   Level 2   Watts 40   Minutes 10   Biostep-RELP   Level 2   Watts 40   Minutes 10   Prescription Details   Frequency (times per week) 3   Duration Progress to 30 minutes of continuous aerobic without signs/symptoms of physical distress   Intensity   THRR REST +  30   Ratings of Perceived Exertion 11-15   Perceived Dyspnea 2-4   Progression Continue progressive overload as per policy without signs/symptoms or physical distress.   Resistance Training   Training Prescription Yes   Weight 2   Reps 10-15      Exercise Prescription Changes:   Discharge Exercise Prescription (Final Exercise Prescription Changes):    Nutrition:  Target Goals: Understanding of nutrition guidelines, daily intake of sodium 1500mg , cholesterol 200mg , calories 30% from fat and 7% or less from  saturated fats,  daily to have 5 or more servings of fruits and vegetables.  Biometrics:     Pre Biometrics - 02/18/15 1819    Pre Biometrics   Height 5\' 9"  (1.753 m)   Weight 261 lb 4.8 oz (118.525 kg)   Waist Circumference 48 inches   Hip Circumference 53.5 inches   Waist to Hip Ratio 0.9 %   BMI (Calculated) 38.7       Nutrition Therapy Plan and Nutrition Goals:     Nutrition Therapy & Goals - 02/18/15 1340    Nutrition Therapy   Diet Jeffrey Burgess is interested in meeting the dietitian. His wife does all the cooking - they like vegtables and salads. He does not eat much sweets, but he does not watch his portion sizes.      Nutrition Discharge: Rate Your Plate Scores:   Psychosocial: Target Goals: Acknowledge presence or absence of depression, maximize coping skills, provide positive support system. Participant is able to verbalize types and ability to use techniques and skills needed for reducing stress and depression.  Initial Review & Psychosocial Screening:     Initial Psych Review & Screening - 02/18/15 1340    Family Dynamics   Good Support System? Yes   Comments Jeffrey Burgess has good family support. He states he misses the activity he use to do before the COPD, but he is not depressed.   Barriers   Psychosocial barriers to participate in program There are no identifiable barriers or psychosocial needs.;The patient should benefit from training in stress management and relaxation.   Screening Interventions   Interventions Encouraged to exercise      Quality of Life Scores:     Quality of Life - 02/18/15 1340    Quality of Life Scores   Health/Function Pre 13.13 %   Socioeconomic Pre 28.44 %   Psych/Spiritual Pre 30 %   Family Pre 25.2 %   GLOBAL Pre 21.49 %      PHQ-9:     Recent Review Flowsheet Data    Depression screen [facility] 2/9 02/18/2015   Decreased Interest 0   Down, Depressed, Hopeless 0   PHQ - 2 Score 0      Psychosocial Evaluation and  Intervention:   Psychosocial Re-Evaluation:  Education: Education Goals: Education classes will be provided on a weekly basis, covering required topics. Participant will state understanding/return demonstration of topics presented.  Learning Barriers/Preferences:     Learning Barriers/Preferences - 02/18/15 1340    Learning Barriers/Preferences   Learning Barriers None   Learning Preferences Group Instruction;Individual Instruction;Pictoral;Skilled Demonstration;Written Material;Verbal Instruction;Video      Education Topics: Initial Evaluation Education: - Verbal, written and demonstration of respiratory meds, RPE/PD scales, oximetry and breathing techniques. Instruction on use of nebulizers and MDIs: cleaning and proper use, rinsing mouth with steroid doses and importance of monitoring MDI activations.          Pulmonary Rehab from 02/18/2015 in [facility]   Date  02/18/15   Educator  LB   Instruction Review Code  2- meets goals/outcomes      General Nutrition Guidelines/Fats and Fiber: -Group instruction provided by verbal, written material, models and posters to present the general guidelines for heart healthy nutrition. Gives an explanation and review of dietary fats and fiber.   Controlling Sodium/Reading Food Labels: -Group verbal and written material supporting the discussion of sodium use in heart healthy nutrition. Review and explanation with models, verbal and written materials for  utilization of the food label.   Exercise Physiology & Risk Factors: - Group verbal and written instruction with models to review the exercise physiology of the cardiovascular system and associated critical values. Details cardiovascular disease risk factors and the goals associated with each risk factor.   Aerobic Exercise & Resistance Training: - Gives group verbal and written discussion on the health impact of inactivity. On the components of  aerobic and resistive training programs and the benefits of this training and how to safely progress through these programs.   Flexibility, Balance, General Exercise Guidelines: - Provides group verbal and written instruction on the benefits of flexibility and balance training programs. Provides general exercise guidelines with specific guidelines to those with heart or lung disease. Demonstration and skill practice provided.   Stress Management: - Provides group verbal and written instruction about the health risks of elevated stress, cause of high stress, and healthy ways to reduce stress.   Depression: - Provides group verbal and written instruction on the correlation between heart/lung disease and depressed mood, treatment options, and the stigmas associated with seeking treatment.   Exercise & Equipment Safety: - Individual verbal instruction and demonstration of equipment use and safety with use of the equipment.      Pulmonary Rehab from 02/26/2015 in [facility] Card[facility] and Pulmonary Rehab   Date  02/26/15   Educator  C. Tityana Pagan,RN   Instruction Review Code  1- partially meets, needs review/practice      Infection Prevention: - Provides verbal and written material to individual with discussion of infection control including proper hand washing and proper equipment cleaning during exercise session.      Pulmonary Rehab from 02/26/2015 in [facility] and Pulmonary Rehab   Date  02/26/15   Educator  C. EnterkinRN   Instruction Review Code  1- partially meets, needs review/practice      Falls Prevention: - Provides verbal and written material to individual with discussion of falls prevention and safety.      Pulmonary Rehab from 02/26/2015 in [facility] Cardiac and Pulmonary Rehab   Date  02/26/15   Educator  C. Vianka Ertel,RN   Instruction Review Code  1- partially meets, needs review/practice      Diabetes: - Individual verbal and written instruction to review signs/symptoms of diabetes,  desired ranges of glucose level fasting, after meals and with exercise. Advice that pre and post exercise glucose checks will be done for 3 sessions at entry of program.      Pulmonary Rehab from 02/26/2015 in [facility] Cardiac and Pulmonary Rehab   Date  02/26/15   Educator  C. EnterkinRN [facility] said he did not check his blood sugar before Pulm Reha]   Instruction Review Code  1- partially meets, needs review/practice [Jeffrey Burgess reports that he has had diabetes for about 20 years. ]      Chronic Lung Diseases: - Group verbal and written instruction to review new updates, new respiratory medications, new advancements in procedures and treatments. Provide informative websites and "800" numbers of self-education.   Lung Procedures: - Group verbal and written instruction to describe testing methods done to diagnose lung disease. Review the outcome of test results. Describe the treatment choices: Pulmonary Function Tests, ABGs and oximetry.   Energy Conservation: - Provide group verbal and written instruction for methods to conserve energy, plan and organize activities. Instruct on pacing techniques, use of adaptive equipment and posture/positioning to relieve shortness of breath.   Triggers: - Group verbal and written instruction to review types of  environmental controls: home humidity, furnaces, filters, dust mite/pet prevention, HEPA vacuums. To discuss weather changes, air quality and the benefits of nasal washing.   Exacerbations: - Group verbal and written instruction to provide: warning signs, infection symptoms, calling MD promptly, preventive modes, and value of vaccinations. Review: effective airway clearance, coughing and/or vibration techniques. Create an Sport and exercise psychologist.   Oxygen: - Individual and group verbal and written instruction on oxygen therapy. Includes supplement oxygen, available portable oxygen systems, continuous and intermittent flow rates, oxygen safety, concentrators, and  Medicare reimbursement for oxygen.   Respiratory Medications: - Group verbal and written instruction to review medications for lung disease. Drug class, frequency, complications, importance of spacers, rinsing mouth after steroid MDI's, and proper cleaning methods for nebulizers.      Pulmonary Rehab from 02/18/2015 in [facility]   Date  02/18/15   Educator  LB   Instruction Review Code  2- meets goals/outcomes      AED/CPR: - Group verbal and written instruction with the use of models to demonstrate the basic use of the AED with the basic ABC's of resuscitation.   Breathing Retraining: - Provides individuals verbal and written instruction on purpose, frequency, and proper technique of diaphragmatic breathing and pursed-lipped breathing. Applies individual practice skills.      Pulmonary Rehab from 02/26/2015 in [facility] Cardiac and Pulmonary Rehab   Date  02/26/15   Educator  C. EnterkinRN   Instruction Review Code  1- partially meets, needs review/practice [Jeffrey Burgess said would start coughing alot since his R nare conges]      Anatomy and Physiology of the Lungs: - Group verbal and written instruction with the use of models to provide basic lung anatomy and physiology related to function, structure and complications of lung disease.   Heart Failure: - Group verbal and written instruction on the basics of heart failure: signs/symptoms, treatments, explanation of ejection fraction, enlarged heart and cardiomyopathy.   Sleep Apnea: - Individual verbal and written instruction to review Obstructive Sleep Apnea. Review of risk factors, methods for diagnosing and types of masks and machines for OSA.      Pulmonary Rehab from 02/18/2015 in [facility]   Date  02/18/15   Educator  LB   Instruction Review Code  2- meets goals/outcomes      Anxiety: - Provides group, verbal and written instruction on the correlation  between heart/lung disease and anxiety, treatment options, and management of anxiety.   Relaxation: - Provides group, verbal and written instruction about the benefits of relaxation for patients with heart/lung disease. Also provides patients with examples of relaxation techniques.   Knowledge Questionnaire Score:     Knowledge Questionnaire Score - 02/18/15 1340    Knowledge Questionnaire Score   Pre Score -2      Personal Goals and Risk Factors at Admission:     Personal Goals and Risk Factors at Admission - 02/18/15 1340    Personal Goals and Risk Factors on Admission    Weight Management Yes   Intervention Learn and follow the exercise and diet guidelines while in the program. Utilize the nutrition and education classes to help gain knowledge of the diet and exercise expectations in the program  Jeffrey Burgess is interested in meeting the dietitian. His wife does all the cooking - they like vegtables and salads. He does not eat much sweets, but he does not watch his portion sizes.   Admit Weight 260 lb (117.935 kg)  Goal Weight 200 lb (90.719 kg)   Increase Aerobic Exercise and Physical Activity Yes   Intervention While in program, learn and follow the exercise prescription taught. Start at a low level workload and increase workload after able to maintain previous level for 30 minutes. Increase time before increasing intensity.  Jeffrey Burgess wants to improve his exercise capacity. He likes to do yardwork and wood working.   Quit Smoking Yes   Number of packs per day 2ppd for 1yrs; he quit a week ago - can not use chantix and uses no other nicotine aid.   Intervention Utilize your health care professional team to help with smoking cessation while in the program. Your doctor can prescribe medications to aid in cessation. The program can provide information and counseling as needed.   Understand more about Heart/Pulmonary Disease. Yes  Jeffrey Burgess would like to learn more about COPD and the  daily management of the disease.   Intervention While in program utilize professionals for any questions, and attend the education sessions. Great websites to use are www.americanheart.org or www.lung.org for reliable information.  Reviewed Jeffrey Burgess's MDIs: Albuterol, Spiriva Respimat, and Symbicort. Instructed on aerochamber and gave him a new chamber.   Improve shortness of breath with ADL's Yes   Intervention While in program, learn and follow the exercise prescription taught. Start at a low level workload and increase workload ad advised by the exercise physiologist. Increase time before increasing intensity.  Jeffrey Burgess would like to have a better understanding of his shortness of breath and less fear with it.    Develop more efficient breathing techniques such as purse lipped breathing and diaphragmatic breathing; and practicing self-pacing with activity Yes   Intervention While in program, learn and utilize the specific breathing techniques taught to you. Continue to practice and use the techniques as needed.  Instructed on PLB and it's benefits.   Increase knowledge of respiratory medications and ability to use respiratory devices properly.  Yes   Intervention While in program, learn to administer MDI, nebulizer, and spacer properly.;Learn to take respiratory medicine as ordered.;While in program, learn to Clean MDI, nebulizers, and spacers properly.  Reviewed MDI's and gave Jeffrey Burgess a spacer.   Diabetes Yes   Goal Blood glucose control identified by blood glucose values, HgbA1C. Participant verbalizes understanding of the signs/symptoms of hyper/hypo glycemia, proper foot care and importance of medication and nutrition plan for blood glucose control.   Intervention Provide nutrition & aerobic exercise along with prescribed medications to achieve blood glucose in normal ranges: Fasting 65-99 mg/dL   Hypertension Yes   Goal Participant will see blood pressure controlled within the values of  140/51mm/Hg or within value directed by their physician.   Intervention Provide nutrition & aerobic exercise along with prescribed medications to achieve BP 140/90 or less.      Personal Goals and Risk Factors Review:    Personal Goals Discharge (Final Personal Goals and Risk Factors Review):    ITP Comments:   Comments: Jeffrey Burgess should reach his goals in 35 sessions. Jeffrey Burgess reports he has not smoked cigarettes and only used to smoke 2 cigarettes/day. Jeffrey Burgess reports he quit smoking inside his home since his oldest daughter was allergic to it years ago. Jeffrey Burgess stumbled when he got off the recumbent elliptical  Even with his cane. 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 Jeffrey Burgess said he has had diabetes for about 20 years. Jeffrey Burgess did not  check his blood sugar before Pulmonary Rehab. His blood sugar before he started exercising was 225.

## 2015-02-26 NOTE — Progress Notes (Signed)
Daily Session Note  Patient Details  Name: Jeffrey Burgess MRN: [id number] Date of Birth: 05/07/1945 Referring Provider:  Delorise Shiner, MD  Encounter Date: 02/26/2015  Check In:     Session Check In - 02/26/15 1214    Check-In   Staff Present Gerlene Burdock, RN, Drusilla Kanner, MS, ACSM CEP, Exercise Physiologist;Laureen Owens Shark, BS, RRT, Respiratory Therapist;Steven Way, BS, ACSM EP-C, Exercise Physiologist   ER physicians immediately available to respond to emergencies [name] immediately available ER MD   Physician(s) Dr. Corky Downs, Dr Cinda Quest   Medication changes reported     No   Fall or balance concerns reported    No   Warm-up and Cool-down Performed on first and last piece of equipment   VAD Patient? No   Pain Assessment   Currently in Pain? Yes   Pain Location Back   Pain Type Chronic pain         Goals Met:  Proper associated with RPD/PD & O2 Sat Exercise tolerated well  Goals Unmet:  Not Applicable  Goals Comments:    Dr. Emily Filbert is Medical Director for [place] and [name].

## 2015-02-28 ENCOUNTER — Encounter: Payer: Non-veteran care | Admitting: *Deleted

## 2015-02-28 DIAGNOSIS — J449 Chronic obstructive pulmonary disease, unspecified: Secondary | ICD-10-CM

## 2015-02-28 LAB — GLUCOSE, CAPILLARY
GLUCOSE-CAPILLARY: 233 mg/dL — AB (ref 65–99)
Glucose-Capillary: 268 mg/dL — ABNORMAL HIGH (ref 65–99)

## 2015-02-28 NOTE — Progress Notes (Signed)
Daily Session Note  Patient Details  Name: Jeffrey Burgess MRN: [id number] Date of Birth: 01/02/1946 Referring Provider:  Salman, Faiza, MD  Encounter Date: 02/28/2015  Check In:     Session Check In - 02/28/15 1147    Check-In   Staff Present Stacey Joyce, RRT, RCP, Respiratory Therapist;Renee MacMillan, MS, ACSM CEP, Exercise Physiologist;Carroll Enterkin, RN, BSN   ER physicians immediately available to respond to emergencies [name] immediately available ER MD   Physician(s) Gayle and Kaminski   Medication changes reported     No   Fall or balance concerns reported    No   Warm-up and Cool-down Performed on first and last piece of equipment   VAD Patient? No   Pain Assessment   Currently in Pain? No/denies   Multiple Pain Sites No           Exercise Prescription Changes - 02/28/15 1100    Exercise Review   Progression Yes   Response to Exercise   Symptoms None   Comments Reviewed individualized exercise prescription and made increases per departmental policy. Exercise increases were discussed with the patient and they were able to perform the new work loads without issue (no signs or symptoms).    Duration Progress to 30 minutes of continuous aerobic without signs/symptoms of physical distress   Intensity Rest + 30   Progression Continue progressive overload as per policy without signs/symptoms or physical distress.   Resistance Training   Training Prescription Yes   Weight 2   Reps 10-12   Treadmill   MPH 1   Grade 0   Minutes 10   NuStep   Level 2   Watts 40   Minutes 15   REL-XR   Level 2   Watts 40   Minutes 10      Goals Met:  Proper associated with RPD/PD & O2 Sat Independence with exercise equipment Using PLB without cueing & demonstrates good technique Exercise tolerated well Personal goals reviewed Strength training completed today  Goals Unmet:  Not Applicable  Goals Comments: Patient completed exercise prescription and all exercise  goals during rehab session. The exercise was tolerated well and the patient is progressing in the program.    Dr. Mark Miller is Medical Director for [facility] and [name]. 

## 2015-03-05 ENCOUNTER — Encounter: Payer: Non-veteran care | Admitting: *Deleted

## 2015-03-05 DIAGNOSIS — J449 Chronic obstructive pulmonary disease, unspecified: Secondary | ICD-10-CM | POA: Diagnosis not present

## 2015-03-05 LAB — GLUCOSE, CAPILLARY
GLUCOSE-CAPILLARY: 248 mg/dL — AB (ref 65–99)
Glucose-Capillary: 190 mg/dL — ABNORMAL HIGH (ref 65–99)

## 2015-03-05 NOTE — Progress Notes (Signed)
Daily Session Note  Patient Details  Name: Jeffrey Burgess MRN: [id number] Date of Birth: 08/26/1945 Referring Provider:  Salman, Faiza, MD  Encounter Date: 03/05/2015  Check In:     Session Check In - 03/05/15 1157    Check-In   Staff Present Laureen Brown, BS, RRT, Respiratory Therapist;Carroll Enterkin, RN, BSN;Renee MacMillan, MS, ACSM CEP, Exercise Physiologist   ER physicians immediately available to respond to emergencies [name] immediately available ER MD   Physician(s) Dr. Kinner and Dr. Williams   Medication changes reported     No   Fall or balance concerns reported    No   Warm-up and Cool-down Performed on first and last piece of equipment   VAD Patient? No   Pain Assessment   Currently in Pain? No/denies         Goals Met:  Proper associated with RPD/PD & O2 Sat Exercise tolerated well  Goals Unmet:  Not Applicable  Goals Comments:    Dr. Mark Miller is Medical Director for [facility] and [name]. 

## 2015-03-05 NOTE — Progress Notes (Signed)
Pulmonary Individual Treatment Plan  Patient Details  Name: Jeffrey Burgess MRN: [id number] Date of Birth: 1945-09-18 Referring Provider:  Delorise Shiner, MD  Initial Encounter Date:    Visit Diagnosis: Chronic obstructive pulmonary disease, unspecified COPD type ([place])  Patient's Home Medications on Admission:  Current outpatient prescriptions:  .  acetaminophen (TYLENOL) 500 MG tablet, Take 500 mg by mouth., Disp: , Rfl:  .  albuterol (PROAIR HFA) 108 (90 Base) MCG/ACT inhaler, Inhale 2 puffs into the lungs., Disp: , Rfl:  .  aspirin EC 81 MG tablet, Take 81 mg by mouth., Disp: , Rfl:  .  beclomethasone (BECONASE-AQ) 42 MCG/SPRAY nasal spray, Place 2 sprays into the nose., Disp: , Rfl:  .  budesonide-formoterol (SYMBICORT) 160-4.5 MCG/ACT inhaler, Inhale 2 puffs into the lungs., Disp: , Rfl:  .  buPROPion (WELLBUTRIN) 100 MG tablet, Take 100 mg by mouth., Disp: , Rfl:  .  busPIRone (BUSPAR) 10 MG tablet, Take 10 mg by mouth., Disp: , Rfl:  .  Cholecalciferol (VITAMIN D3 SUPER STRENGTH) 2000 units TABS, Take 2,000 Units by mouth., Disp: , Rfl:  .  docusate sodium (STOOL SOFTENER) 100 MG capsule, Take 100 mg by mouth., Disp: , Rfl:  .  fluticasone (FLONASE) 50 MCG/ACT nasal spray, Place 1 spray into the nose., Disp: , Rfl:  .  insulin aspart protamine - aspart (NOVOLOG 70/30 MIX) (70-30) 100 UNIT/ML FlexPen, Inject 18 Units into the skin., Disp: , Rfl:  .  metFORMIN (GLUCOPHAGE) 500 MG tablet, Take 500 mg by mouth., Disp: , Rfl:  .  metoprolol tartrate (LOPRESSOR) 25 MG tablet, Take 25 mg by mouth., Disp: , Rfl:  .  montelukast (SINGULAIR) 10 MG tablet, Take 10 mg by mouth., Disp: , Rfl:  .  nitroGLYCERIN (NITROSTAT) 0.4 MG SL tablet, Place 0.4 mg under the tongue., Disp: , Rfl:  .  Omega-3 1000 MG CAPS, Take 2 g by mouth., Disp: , Rfl:  .  pramipexole (MIRAPEX) 0.5 MG tablet, Take 0.5 mg by mouth., Disp: , Rfl:  .  pregabalin (LYRICA) 100 MG capsule, Take 100 mg by mouth., Disp: , Rfl:   .  ranitidine (ZANTAC) 150 MG capsule, Take 150 mg by mouth., Disp: , Rfl:  .  rosuvastatin (CRESTOR) 20 MG tablet, Take 20 mg by mouth., Disp: , Rfl:  .  tiotropium (SPIRIVA) 18 MCG inhalation capsule, Place 18 mcg into inhaler and inhale., Disp: , Rfl:  .  VITAMIN B COMPLEX-C CAPS, Take 1 tablet by mouth., Disp: , Rfl:  .  vitamin E 400 UNIT capsule, Take 400 Units by mouth., Disp: , Rfl:   Past Medical History: Past Medical History  Diagnosis Date  . Asthma   . Rheumatoid arthritis ([place])   . Hyperlipidemia   . Hypertension   . Sleep apnea   . Myocardial infarction ([place])   . Diabetes ([place])     Tobacco Use: History  Smoking status  . Former Smoker -- 1.00 packs/day for 57 years  . Types: Cigarettes  . Quit date: 02/10/2015  Smokeless tobacco  . Former Geophysical data processor: Recent Review Flowsheet Data    There is no flowsheet data to display.         POCT Glucose      02/26/15 1130           POCT Blood Glucose   Pre-Exercise 224 mg/dL  Breakfast one hour ago       Post-Exercise 193 mg/dL  ADL UCSD:     ADL UCSD      02/18/15 1340       ADL UCSD   ADL Phase Entry     SOB Score total 65     Rest 1     Walk 2     Stairs 4     Bath 2     Dress 2     Shop 4         Pulmonary Function Assessment:     Pulmonary Function Assessment - 02/18/15 1340    Initial Spirometry Results   FVC% 62 %   FEV1% 43 %   FEV1/FVC Ratio 55.33   Post Bronchodilator Spirometry Results   FVC% 54 %   FEV1% 62 %   FEV1/FVC Ratio 71.51   Breath   Bilateral Breath Sounds Clear;Decreased   Shortness of Breath Yes;Limiting activity;Fear of Shortness of Breath      Exercise Target Goals:    Exercise Program Goal: Individual exercise prescription set with THRR, safety & activity barriers. Participant demonstrates ability to understand and report RPE using BORG scale, to self-measure pulse accurately, and to acknowledge the importance of the exercise  prescription.  Exercise Prescription Goal: Starting with aerobic activity 30 plus minutes a day, 3 days per week for initial exercise prescription. Provide home exercise prescription and guidelines that participant acknowledges understanding prior to discharge.  Activity Barriers & Risk Stratification:     Activity Barriers & Risk Stratification - 02/18/15 1340    Activity Barriers & Risk Stratification   Activity Barriers Assistive Device;Deconditioning;Muscular Weakness;Balance Concerns   Risk Stratification Moderate      6 Minute Walk:     6 Minute Walk      02/18/15 1817       6 Minute Walk   Phase Initial     Distance 802 feet     Walk Time 6 minutes     Resting HR 88 bpm     Resting BP 136/72 mmHg     Max Ex. HR 86 bpm     Max Ex. BP 146/72 mmHg     RPE 14     Perceived Dyspnea  3     Symptoms No        Initial Exercise Prescription:     Initial Exercise Prescription - 02/18/15 1800    Date of Initial Exercise Prescription   Date 02/18/15   Treadmill   MPH 1   Grade 0   Minutes 10   Recumbant Bike   Level 2   RPM 40   Watts 20   Minutes 10   NuStep   Level 2   Watts 40   Minutes 10   Arm Ergometer   Level 1   Watts 10   Minutes 10   Recumbant Elliptical   Level 2   RPM 40   Watts 20   Minutes 10   REL-XR   Level 2   Watts 40   Minutes 10   Biostep-RELP   Level 2   Watts 40   Minutes 10   Prescription Details   Frequency (times per week) 3   Duration Progress to 30 minutes of continuous aerobic without signs/symptoms of physical distress   Intensity   THRR REST +  30   Ratings of Perceived Exertion 11-15   Perceived Dyspnea 2-4   Progression Continue progressive overload as per policy without signs/symptoms or physical distress.   Resistance Training   Training Prescription Yes  Weight 2   Reps 10-15      Exercise Prescription Changes:     Exercise Prescription Changes      02/26/15 1500 02/28/15 1100          Exercise Review   Progression  Yes      Response to Exercise   Blood Pressure (Admit) 130/78 mmHg       Blood Pressure (Exercise) 158/82 mmHg       Blood Pressure (Exit) 120/70 mmHg       Heart Rate (Admit) 74 bpm       Heart Rate (Exercise) 100 bpm       Heart Rate (Exit) 86 bpm       Oxygen Saturation (Admit) 97 %       Oxygen Saturation (Exercise) 96 %       Oxygen Saturation (Exit) 92 %       Rating of Perceived Exertion (Exercise) 12       Perceived Dyspnea (Exercise) 3       Symptoms  None      Comments  Reviewed individualized exercise prescription and made increases per departmental policy. Exercise increases were discussed with the patient and they were able to perform the new work loads without issue (no signs or symptoms).       Duration  Progress to 30 minutes of continuous aerobic without signs/symptoms of physical distress      Intensity  Rest + 30      Progression  Continue progressive overload as per policy without signs/symptoms or physical distress.      Resistance Training   Training Prescription Yes Yes      Weight 2 2      Reps 10-12 10-12      Treadmill   MPH 1 1      Grade 0 0      Minutes 10 10      NuStep   Level 2 2      Watts 40 40      Minutes 10 15      REL-XR   Level 2 2      Watts 40 40      Minutes 10 10         Discharge Exercise Prescription (Final Exercise Prescription Changes):     Exercise Prescription Changes - 02/28/15 1100    Exercise Review   Progression Yes   Response to Exercise   Symptoms None   Comments Reviewed individualized exercise prescription and made increases per departmental policy. Exercise increases were discussed with the patient and they were able to perform the new work loads without issue (no signs or symptoms).    Duration Progress to 30 minutes of continuous aerobic without signs/symptoms of physical distress   Intensity Rest + 30   Progression Continue progressive overload as per policy without  signs/symptoms or physical distress.   Resistance Training   Training Prescription Yes   Weight 2   Reps 10-12   Treadmill   MPH 1   Grade 0   Minutes 10   NuStep   Level 2   Watts 40   Minutes 15   REL-XR   Level 2   Watts 40   Minutes 10       Nutrition:  Target Goals: Understanding of nutrition guidelines, daily intake of sodium <1573m, cholesterol <2044m calories 30% from fat and 7% or less from saturated fats, daily to have 5 or more servings of fruits and vegetables.  Biometrics:     Pre Biometrics - 02/18/15 1819    Pre Biometrics   Height 5' 9"  (1.753 m)   Weight 261 lb 4.8 oz (118.525 kg)   Waist Circumference 48 inches   Hip Circumference 53.5 inches   Waist to Hip Ratio 0.9 %   BMI (Calculated) 38.7       Nutrition Therapy Plan and Nutrition Goals:     Nutrition Therapy & Goals - 02/18/15 1340    Nutrition Therapy   Diet Mr Wirtanen is interested in meeting the dietitian. His wife does all the cooking - they like vegtables and salads. He does not eat much sweets, but he does not watch his portion sizes.      Nutrition Discharge: Rate Your Plate Scores:   Psychosocial: Target Goals: Acknowledge presence or absence of depression, maximize coping skills, provide positive support system. Participant is able to verbalize types and ability to use techniques and skills needed for reducing stress and depression.  Initial Review & Psychosocial Screening:     Initial Psych Review & Screening - 02/18/15 [place]? Yes   Comments Mr Degidio has good family support. He states he misses the activity he use to do before the COPD, but he is not depressed.   Barriers   Psychosocial barriers to participate in program There are no identifiable barriers or psychosocial needs.;The patient should benefit from training in stress management and relaxation.   Screening Interventions   Interventions Encouraged to exercise       Quality of Life Scores:     Quality of Life - 02/18/15 1340    Quality of Life Scores   Health/Function Pre 13.13 %   Socioeconomic Pre 28.44 %   Psych/Spiritual Pre 30 %   Family Pre 25.2 %   GLOBAL Pre 21.49 %      PHQ-9:     Recent Review Flowsheet Data    Depression screen [facility] 2/9 02/18/2015   Decreased Interest 0   Down, Depressed, Hopeless 0   PHQ - 2 Score 0      Psychosocial Evaluation and Intervention:   Psychosocial Re-Evaluation:  Education: Education Goals: Education classes will be provided on a weekly basis, covering required topics. Participant will state understanding/return demonstration of topics presented.  Learning Barriers/Preferences:     Learning Barriers/Preferences - 02/18/15 1340    Learning Barriers/Preferences   Learning Barriers None   Learning Preferences Group Instruction;Individual Instruction;Pictoral;Skilled Demonstration;Written Material;Verbal Instruction;Video      Education Topics: Initial Evaluation Education: - Verbal, written and demonstration of respiratory meds, RPE/PD scales, oximetry and breathing techniques. Instruction on use of nebulizers and MDIs: cleaning and proper use, rinsing mouth with steroid doses and importance of monitoring MDI activations.          Pulmonary Rehab from 02/18/2015 in [place]   Date  02/18/15   Educator  LB   Instruction Review Code  2- meets goals/outcomes      General Nutrition Guidelines/Fats and Fiber: -Group instruction provided by verbal, written material, models and posters to present the general guidelines for heart healthy nutrition. Gives an explanation and review of dietary fats and fiber.   Controlling Sodium/Reading Food Labels: -Group verbal and written material supporting the discussion of sodium use in heart healthy nutrition. Review and explanation with models, verbal and written materials for utilization of the food  label.   Exercise Physiology & Risk Factors: - Group  verbal and written instruction with models to review the exercise physiology of the cardiovascular system and associated critical values. Details cardiovascular disease risk factors and the goals associated with each risk factor.   Aerobic Exercise & Resistance Training: - Gives group verbal and written discussion on the health impact of inactivity. On the components of aerobic and resistive training programs and the benefits of this training and how to safely progress through these programs.   Flexibility, Balance, General Exercise Guidelines: - Provides group verbal and written instruction on the benefits of flexibility and balance training programs. Provides general exercise guidelines with specific guidelines to those with heart or lung disease. Demonstration and skill practice provided.   Stress Management: - Provides group verbal and written instruction about the health risks of elevated stress, cause of high stress, and healthy ways to reduce stress.   Depression: - Provides group verbal and written instruction on the correlation between heart/lung disease and depressed mood, treatment options, and the stigmas associated with seeking treatment.      Pulmonary Rehab from 03/05/2015 in [facility] Cardiac and Pulmonary Rehab   Date  03/05/15   Educator  Berle Mull   Instruction Review Code  2- meets goals/outcomes      Exercise & Equipment Safety: - Individual verbal instruction and demonstration of equipment use and safety with use of the equipment.      Pulmonary Rehab from 03/05/2015 in [facility] Cardiac and Pulmonary Rehab   Date  02/26/15   Educator  C. Taysom Glymph,RN   Instruction Review Code  1- partially meets, needs review/practice      Infection Prevention: - Provides verbal and written material to individual with discussion of infection control including proper hand washing and proper equipment cleaning during exercise  session.      Pulmonary Rehab from 03/05/2015 in [facility] Cardiac and Pulmonary Rehab   Date  02/26/15   Educator  C. [place]   Instruction Review Code  1- partially meets, needs review/practice      Falls Prevention: - Provides verbal and written material to individual with discussion of falls prevention and safety.      Pulmonary Rehab from 03/05/2015 in [facility] Cardiac and Pulmonary Rehab   Date  02/26/15   Educator  C. Serene Kopf,RN   Instruction Review Code  1- partially meets, needs review/practice      Diabetes: - Individual verbal and written instruction to review signs/symptoms of diabetes, desired ranges of glucose level fasting, after meals and with exercise. Advice that pre and post exercise glucose checks will be done for 3 sessions at entry of program.      Pulmonary Rehab from 03/05/2015 in [facility] Cardiac and Pulmonary Rehab   Date  02/26/15   Educator  C. EnterkinRN [employer] said he did not check his blood sugar before [place]   Instruction Review Code  1- partially meets, needs review/practice [Tai reports that he has had diabetes for about 20 years. ]      Chronic Lung Diseases: - Group verbal and written instruction to review new updates, new respiratory medications, new advancements in procedures and treatments. Provide informative websites and "800" numbers of self-education.   Lung Procedures: - Group verbal and written instruction to describe testing methods done to diagnose lung disease. Review the outcome of test results. Describe the treatment choices: Pulmonary Function Tests, ABGs and oximetry.   Energy Conservation: - Provide group verbal and written instruction for methods to conserve energy, plan and organize activities. Instruct on pacing techniques, use  of adaptive equipment and posture/positioning to relieve shortness of breath.   Triggers: - Group verbal and written instruction to review types of environmental controls: home humidity, furnaces,  filters, dust mite/pet prevention, HEPA vacuums. To discuss weather changes, air quality and the benefits of nasal washing.   Exacerbations: - Group verbal and written instruction to provide: warning signs, infection symptoms, calling MD promptly, preventive modes, and value of vaccinations. Review: effective airway clearance, coughing and/or vibration techniques. Create an Sports administrator.   Oxygen: - Individual and group verbal and written instruction on oxygen therapy. Includes supplement oxygen, available portable oxygen systems, continuous and intermittent flow rates, oxygen safety, concentrators, and Medicare reimbursement for oxygen.   Respiratory Medications: - Group verbal and written instruction to review medications for lung disease. Drug class, frequency, complications, importance of spacers, rinsing mouth after steroid MDI's, and proper cleaning methods for nebulizers.      Pulmonary Rehab from 02/18/2015 in [place]   Date  02/18/15   Educator  LB   Instruction Review Code  2- meets goals/outcomes      AED/CPR: - Group verbal and written instruction with the use of models to demonstrate the basic use of the AED with the basic ABC's of resuscitation.   Breathing Retraining: - Provides individuals verbal and written instruction on purpose, frequency, and proper technique of diaphragmatic breathing and pursed-lipped breathing. Applies individual practice skills.      Pulmonary Rehab from 03/05/2015 in [facility] Cardiac and Pulmonary Rehab   Date  02/26/15   Educator  C. EnterkinRN   Instruction Review Code  1- partially meets, needs review/practice [Mason said would start coughing alot since his R nare conges]      Anatomy and Physiology of the Lungs: - Group verbal and written instruction with the use of models to provide basic lung anatomy and physiology related to function, structure and complications of lung disease.   Heart  Failure: - Group verbal and written instruction on the basics of heart failure: signs/symptoms, treatments, explanation of ejection fraction, enlarged heart and cardiomyopathy.   Sleep Apnea: - Individual verbal and written instruction to review Obstructive Sleep Apnea. Review of risk factors, methods for diagnosing and types of masks and machines for OSA.      Pulmonary Rehab from 02/18/2015 in [place]   Date  02/18/15   Educator  LB   Instruction Review Code  2- meets goals/outcomes      Anxiety: - Provides group, verbal and written instruction on the correlation between heart/lung disease and anxiety, treatment options, and management of anxiety.   Relaxation: - Provides group, verbal and written instruction about the benefits of relaxation for patients with heart/lung disease. Also provides patients with examples of relaxation techniques.   Knowledge Questionnaire Score:     Knowledge Questionnaire Score - 02/18/15 1340    Knowledge Questionnaire Score   Pre Score -2      Personal Goals and Risk Factors at Admission:     Personal Goals and Risk Factors at Admission - 02/18/15 1340    Personal Goals and Risk Factors on Admission    Weight Management Yes   Intervention Learn and follow the exercise and diet guidelines while in the program. Utilize the nutrition and education classes to help gain knowledge of the diet and exercise expectations in the program  Mr Pfiffner is interested in meeting the dietitian. His wife does all the cooking - they like vegtables and salads.  He does not eat much sweets, but he does not watch his portion sizes.   Admit Weight 260 lb (117.935 kg)   Goal Weight 200 lb (90.719 kg)   Increase Aerobic Exercise and Physical Activity Yes   Intervention While in program, learn and follow the exercise prescription taught. Start at a low level workload and increase workload after able to maintain previous level for  30 minutes. Increase time before increasing intensity.  Mr Jungbluth wants to improve his exercise capacity. He likes to do yardwork and wood working.   Quit Smoking Yes   Number of packs per day 2ppd for 59yr; he quit a week ago - can not use chantix and uses no other nicotine aid.   Intervention Utilize your health care professional team to help with smoking cessation while in the program. Your doctor can prescribe medications to aid in cessation. The program can provide information and counseling as needed.   Understand more about Heart/Pulmonary Disease. Yes  Mr TRednerwould like to learn more about COPD and the daily management of the disease.   Intervention While in program utilize professionals for any questions, and attend the education sessions. Great websites to use are www.americanheart.org or www.lung.org for reliable information.  Reviewed Mr Ackerman's MDIs: Albuterol, Spiriva Respimat, and Symbicort. Instructed on aerochamber and gave him a new chamber.   Improve shortness of breath with ADL's Yes   Intervention While in program, learn and follow the exercise prescription taught. Start at a low level workload and increase workload ad advised by the exercise physiologist. Increase time before increasing intensity.  Mr TDayhoffwould like to have a better understanding of his shortness of breath and less fear with it.    Develop more efficient breathing techniques such as purse lipped breathing and diaphragmatic breathing; and practicing self-pacing with activity Yes   Intervention While in program, learn and utilize the specific breathing techniques taught to you. Continue to practice and use the techniques as needed.  Instructed on PLB and it's benefits.   Increase knowledge of respiratory medications and ability to use respiratory devices properly.  Yes   Intervention While in program, learn to administer MDI, nebulizer, and spacer properly.;Learn to take respiratory medicine as  ordered.;While in program, learn to Clean MDI, nebulizers, and spacers properly.  Reviewed MDI's and gave Mr TLawhorna spacer.   Diabetes Yes   Goal Blood glucose control identified by blood glucose values, HgbA1C. Participant verbalizes understanding of the signs/symptoms of hyper/hypo glycemia, proper foot care and importance of medication and nutrition plan for blood glucose control.   Intervention Provide nutrition & aerobic exercise along with prescribed medications to achieve blood glucose in normal ranges: Fasting 65-99 mg/dL   Hypertension Yes   Goal Participant will see blood pressure controlled within the values of 140/961mHg or within value directed by their physician.   Intervention Provide nutrition & aerobic exercise along with prescribed medications to achieve BP 140/90 or less.      Personal Goals and Risk Factors Review:      Goals and Risk Factor Review      02/26/15 1130 02/28/15 1301         Weight Management   Goals Progress/Improvement seen  (p) Yes      Comments  (p) Has gone from 50 in pants to 48 inche pants.       Quit Smoking   Goals Progress/Improvement seen Yes       Comments Mr TuHulseas not had a  cigarrette since his quit date which is now about 2 weeks. He does not use any nicotine replacement.       Diabetes   Goal  (p) Blood glucose control identified by blood glucose values, HgbA1C. Participant verbalizes understanding of the signs/symptoms of hyper/hypo glycemia, proper foot care and importance of medication and nutrition plan for blood glucose control.         Personal Goals Discharge (Final Personal Goals and Risk Factors Review):      Goals and Risk Factor Review - 02/28/15 1301    Weight Management   Goals Progress/Improvement seen (p) Yes   Comments (p) Has gone from 50 in pants to 48 inche pants.    Diabetes   Goal (p) Blood glucose control identified by blood glucose values, HgbA1C. Participant verbalizes understanding of the  signs/symptoms of hyper/hypo glycemia, proper foot care and importance of medication and nutrition plan for blood glucose control.      ITP Comments:     ITP Comments      02/26/15 1130 02/28/15 1149         ITP Comments Chronic pain level "6"; unchanged with exercise Personal and exercise goals expected to be met in 33 more sessions. Progress on specific individualized goals will be charted in patient's ITP. Upon completion of the program the patient will be comfortable managing exercise goals and progression on their own.          Comments: Expect Qu[place] to attain goals after completing 32 sessions.

## 2015-03-07 ENCOUNTER — Encounter: Payer: Non-veteran care | Admitting: *Deleted

## 2015-03-07 DIAGNOSIS — J449 Chronic obstructive pulmonary disease, unspecified: Secondary | ICD-10-CM

## 2015-03-07 NOTE — Progress Notes (Signed)
Daily Session Note  Patient Details  Name: Jeffrey Burgess MRN: [id number] Date of Birth: Mar 28, 1945 Referring Provider:  Delorise Shiner, MD  Encounter Date: 03/07/2015  Check In:     Session Check In - 03/07/15 1241    Check-In   Staff Present Heath Lark, RN, BSN, CCRP;Stacey Blanch Media, RRT, RCP, Respiratory Griffin Basil, MS, ACSM CEP, Exercise Physiologist   ER physicians immediately available to respond to emergencies [name] immediately available ER MD   Physician(s) Drs: Jacqualine Code and Joni Fears   Medication changes reported     No   Fall or balance concerns reported    No   Warm-up and Cool-down Performed on first and last piece of equipment   VAD Patient? No   Pain Assessment   Currently in Pain? No/denies         Goals Met:  Exercise tolerated well Personal goals reviewed Strength training completed today  Goals Unmet:  Not Applicable  Goals Comments: New start to the program. Instructions provided for new equipment and exercise guidelines. [name] did well today with  the exercise routine.   Dr. Emily Filbert is Medical Director for [facility] and [name].

## 2015-03-10 ENCOUNTER — Encounter: Payer: Non-veteran care | Admitting: *Deleted

## 2015-03-10 DIAGNOSIS — J449 Chronic obstructive pulmonary disease, unspecified: Secondary | ICD-10-CM | POA: Diagnosis not present

## 2015-03-10 NOTE — Progress Notes (Signed)
Daily Session Note  Patient Details  Name: Jeffrey Burgess MRN: [id number] Date of Birth: April 27, 1945 Referring Provider:  Delorise Shiner, MD  Encounter Date: 03/10/2015  Check In:     Session Check In - 03/10/15 1205    Check-In   Staff Present [name], BS, RRT, Respiratory Therapist;Daisi Kentner, RN, BSN, CCRP;Steven Way, BS, ACSM EP-C, Exercise Physiologist   ER physicians immediately available to respond to emergencies [name] immediately available ER MD   Physician(s) Drs: Dineen Kid and Marcelene Butte   Medication changes reported     No   Fall or balance concerns reported    No   Warm-up and Cool-down Performed on first and last piece of equipment   VAD Patient? No   Pain Assessment   Currently in Pain? No/denies         Goals Met:  Exercise tolerated well Personal goals reviewed Strength training completed today  Goals Unmet:  Not Applicable  Goals Comments: Doing well with exercise prescription progression..    Dr. Emily Filbert is Medical Director for [facility] and [name].

## 2015-03-12 ENCOUNTER — Encounter: Payer: Non-veteran care | Admitting: *Deleted

## 2015-03-12 DIAGNOSIS — J449 Chronic obstructive pulmonary disease, unspecified: Secondary | ICD-10-CM

## 2015-03-12 NOTE — Progress Notes (Signed)
Daily Session Note  Patient Details  Name: Jeffrey Burgess MRN: [id number] Date of Birth: [birth year] Referring Provider:  Delorise Shiner, MD  Encounter Date: 03/12/2015  Check In:     Session Check In - 03/12/15 1222    Check-In   Staff Present [name], BS, RRT, Respiratory Therapist;Ignace Mandigo, RN, BSN;Steven Way, BS, ACSM EP-C, Exercise Physiologist   ER physicians immediately available to respond to emergencies [name] immediately available ER MD   Physician(s) Dr. Cinda Quest and Dr. Darl Householder   Medication changes reported     No   Fall or balance concerns reported    No   Warm-up and Cool-down Performed on first and last piece of equipment   VAD Patient? No   Pain Assessment   Currently in Pain? No/denies         Goals Met:  Proper associated with RPD/PD & O2 Sat Exercise tolerated well  Goals Unmet:  Not Applicable  Goals Comments: Emit is at visit 7/36 and should complete his goals of having his own exercise plan by visit 36.   Dr. Emily Filbert is Medical Director for [facility] and [name].

## 2015-03-14 ENCOUNTER — Encounter: Payer: Non-veteran care | Admitting: *Deleted

## 2015-03-14 DIAGNOSIS — J449 Chronic obstructive pulmonary disease, unspecified: Secondary | ICD-10-CM | POA: Diagnosis not present

## 2015-03-14 NOTE — Progress Notes (Signed)
Daily Session Note  Patient Details  Name: Jeffrey Burgess MRN: [id number] Date of Birth: 1945/06/11 Referring Provider:  Delorise Shiner, MD  Encounter Date: 03/14/2015  Check In:     Session Check In - 03/14/15 1140    Check-In   Staff Present Frederich Cha, RRT, RCP, Respiratory Therapist;Caine Barfield Dillard Essex, MS, ACSM CEP, Exercise Physiologist;Carroll Enterkin, RN, BSN   ER physicians immediately available to respond to emergencies [name] immediately available ER MD   Physician(s) Clearnce Hasten and Quale   Medication changes reported     No   Fall or balance concerns reported    No   Warm-up and Cool-down Performed on first and last piece of equipment   VAD Patient? No   Pain Assessment   Currently in Pain? No/denies   Multiple Pain Sites No           Exercise Prescription Changes - 03/14/15 1100    Exercise Review   Progression Yes   Response to Exercise   Symptoms None   Comments Reviewed individualized exercise prescription and made increases per departmental policy. Exercise increases were discussed with the patient and they were able to perform the new work loads without issue (no signs or symptoms).    Duration Progress to 50 minutes of aerobic without signs/symptoms of physical distress   Intensity THRR unchanged   Progression Continue progressive overload as per policy without signs/symptoms or physical distress.   Resistance Training   Training Prescription Yes   Weight 3   Reps 10-12   Treadmill   MPH 2.1   Grade 0   Minutes 15   NuStep   Level 5   Watts 60   Minutes 15   REL-XR   Level 6   Watts 70   Minutes 15      Goals Met:  Proper associated with RPD/PD & O2 Sat Independence with exercise equipment Improved SOB with ADL's Exercise tolerated well Personal goals reviewed Strength training completed today  Goals Unmet:  Not Applicable  Goals Comments: Patient completed exercise prescription and all exercise goals during rehab session. The  exercise was tolerated well and the patient is progressing in the program.    Dr. Emily Filbert is Medical Director for [facility] and [name].

## 2015-03-17 ENCOUNTER — Encounter: Payer: Non-veteran care | Admitting: *Deleted

## 2015-03-17 DIAGNOSIS — J449 Chronic obstructive pulmonary disease, unspecified: Secondary | ICD-10-CM

## 2015-03-17 NOTE — Progress Notes (Signed)
Pulmonary Individual Treatment Plan  Patient Details  Name: Jeffrey Burgess MRN: [id number] Date of Birth: May 24, 1945 Referring Provider:  Delorise Shiner, MD  Initial Encounter Date: 11/19/2014  Visit Diagnosis: Chronic obstructive pulmonary disease, unspecified COPD type ([place]) - Plan: PULMONARY REHAB 30 DAY REVIEW  Patient's Home Medications on Admission:  Current outpatient prescriptions:    acetaminophen (TYLENOL) 500 MG tablet, Take 500 mg by mouth., Disp: , Rfl:    albuterol (PROAIR HFA) 108 (90 Base) MCG/ACT inhaler, Inhale 2 puffs into the lungs., Disp: , Rfl:    aspirin EC 81 MG tablet, Take 81 mg by mouth., Disp: , Rfl:    beclomethasone (BECONASE-AQ) 42 MCG/SPRAY nasal spray, Place 2 sprays into the nose., Disp: , Rfl:    budesonide-formoterol (SYMBICORT) 160-4.5 MCG/ACT inhaler, Inhale 2 puffs into the lungs., Disp: , Rfl:    buPROPion (WELLBUTRIN) 100 MG tablet, Take 100 mg by mouth., Disp: , Rfl:    busPIRone (BUSPAR) 10 MG tablet, Take 10 mg by mouth., Disp: , Rfl:    Cholecalciferol (VITAMIN D3 SUPER STRENGTH) 2000 units TABS, Take 2,000 Units by mouth., Disp: , Rfl:    docusate sodium (STOOL SOFTENER) 100 MG capsule, Take 100 mg by mouth., Disp: , Rfl:    fluticasone (FLONASE) 50 MCG/ACT nasal spray, Place 1 spray into the nose., Disp: , Rfl:    insulin aspart protamine - aspart (NOVOLOG 70/30 MIX) (70-30) 100 UNIT/ML FlexPen, Inject 18 Units into the skin., Disp: , Rfl:    metFORMIN (GLUCOPHAGE) 500 MG tablet, Take 500 mg by mouth., Disp: , Rfl:    metoprolol tartrate (LOPRESSOR) 25 MG tablet, Take 25 mg by mouth., Disp: , Rfl:    montelukast (SINGULAIR) 10 MG tablet, Take 10 mg by mouth., Disp: , Rfl:    nitroGLYCERIN (NITROSTAT) 0.4 MG SL tablet, Place 0.4 mg under the tongue., Disp: , Rfl:    Omega-3 1000 MG CAPS, Take 2 g by mouth., Disp: , Rfl:    pramipexole (MIRAPEX) 0.5 MG tablet, Take 0.5 mg by mouth., Disp: , Rfl:    pregabalin (LYRICA) 100 MG  capsule, Take 100 mg by mouth., Disp: , Rfl:    ranitidine (ZANTAC) 150 MG capsule, Take 150 mg by mouth., Disp: , Rfl:    rosuvastatin (CRESTOR) 20 MG tablet, Take 20 mg by mouth., Disp: , Rfl:    tiotropium (SPIRIVA) 18 MCG inhalation capsule, Place 18 mcg into inhaler and inhale., Disp: , Rfl:    VITAMIN B COMPLEX-C CAPS, Take 1 tablet by mouth., Disp: , Rfl:    vitamin E 400 UNIT capsule, Take 400 Units by mouth., Disp: , Rfl:   Past Medical History: Past Medical History  Diagnosis Date   Asthma    Rheumatoid arthritis ([place])    Hyperlipidemia    Hypertension    Sleep apnea    Myocardial infarction ([place])    Diabetes ([place])     Tobacco Use: History  Smoking status   Former Smoker -- 1.00 packs/day for 57 years   Types: Cigarettes   Quit date: 02/10/2015  Smokeless tobacco   Former Systems developer    Labs: Recent Review Flowsheet Data    There is no flowsheet data to display.         POCT Glucose      02/26/15 1130 03/05/15 1130 03/05/15 1531 03/05/15 1535     POCT Blood Glucose   Pre-Exercise 224 mg/dL  Breakfast one hour ago       Post-Exercise 193 mg/dL  Pre-Exercise #2   268 mg/dL     Post-Exercise #2   233 mg/dL --  02/28/2015    Pre-Exercise #3  248 mg/dL      Post-Exercise #3  190 mg/dL         ADL UCSD:     ADL UCSD      02/18/15 1340       ADL UCSD   ADL Phase Entry     SOB Score total 65     Rest 1     Walk 2     Stairs 4     Bath 2     Dress 2     Shop 4         Pulmonary Function Assessment:     Pulmonary Function Assessment - 02/18/15 1340    Initial Spirometry Results   FVC% 62 %   FEV1% 43 %   FEV1/FVC Ratio 55.33   Post Bronchodilator Spirometry Results   FVC% 54 %   FEV1% 62 %   FEV1/FVC Ratio 71.51   Breath   Bilateral Breath Sounds Clear;Decreased   Shortness of Breath Yes;Limiting activity;Fear of Shortness of Breath      Exercise Target Goals:    Exercise Program Goal: Individual exercise  prescription set with THRR, safety & activity barriers. Participant demonstrates ability to understand and report RPE using BORG scale, to self-measure pulse accurately, and to acknowledge the importance of the exercise prescription.  Exercise Prescription Goal: Starting with aerobic activity 30 plus minutes a day, 3 days per week for initial exercise prescription. Provide home exercise prescription and guidelines that participant acknowledges understanding prior to discharge.  Activity Barriers & Risk Stratification:     Activity Barriers & Risk Stratification - 02/18/15 1340    Activity Barriers & Risk Stratification   Activity Barriers Assistive Device;Deconditioning;Muscular Weakness;Balance Concerns   Risk Stratification Moderate      6 Minute Walk:     6 Minute Walk      02/18/15 1817       6 Minute Walk   Phase Initial     Distance 802 feet     Walk Time 6 minutes     Resting HR 88 bpm     Resting BP 136/72 mmHg     Max Ex. HR 86 bpm     Max Ex. BP 146/72 mmHg     RPE 14     Perceived Dyspnea  3     Symptoms No        Initial Exercise Prescription:     Initial Exercise Prescription - 02/18/15 1800    Date of Initial Exercise Prescription   Date 02/18/15   Treadmill   MPH 1   Grade 0   Minutes 10   Recumbant Bike   Level 2   RPM 40   Watts 20   Minutes 10   NuStep   Level 2   Watts 40   Minutes 10   Arm Ergometer   Level 1   Watts 10   Minutes 10   Recumbant Elliptical   Level 2   RPM 40   Watts 20   Minutes 10   REL-XR   Level 2   Watts 40   Minutes 10   Biostep-RELP   Level 2   Watts 40   Minutes 10   Prescription Details   Frequency (times per week) 3   Duration Progress to 30 minutes of continuous aerobic without signs/symptoms of physical distress  Intensity   THRR REST +  30   Ratings of Perceived Exertion 11-15   Perceived Dyspnea 2-4   Progression Continue progressive overload as per policy without signs/symptoms or  physical distress.   Resistance Training   Training Prescription Yes   Weight 2   Reps 10-15      Exercise Prescription Changes:     Exercise Prescription Changes      02/26/15 1500 02/28/15 1100 03/05/15 1200 03/10/15 1600 03/14/15 1100   Exercise Review   Progression  Yes Yes Yes Yes   Response to Exercise   Blood Pressure (Admit) 130/78 mmHg   140/70 mmHg    Blood Pressure (Exercise) 158/82 mmHg   168/88 mmHg    Blood Pressure (Exit) 120/70 mmHg   136/76 mmHg    Heart Rate (Admit) 74 bpm   85 bpm    Heart Rate (Exercise) 100 bpm   115 bpm    Heart Rate (Exit) 86 bpm   89 bpm    Oxygen Saturation (Admit) 97 %   96 %    Oxygen Saturation (Exercise) 96 %   97 %    Oxygen Saturation (Exit) 92 %   94 %    Rating of Perceived Exertion (Exercise) 12   12    Perceived Dyspnea (Exercise) 3   4    Symptoms  None None none None   Comments  Reviewed individualized exercise prescription and made increases per departmental policy. Exercise increases were discussed with the patient and they were able to perform the new work loads without issue (no signs or symptoms).  Chioke is progressing well in the program. He is gaining strength and stamin and this is reflected in higher workloads and longer time.   Reviewed individualized exercise prescription and made increases per departmental policy. Exercise increases were discussed with the patient and they were able to perform the new work loads without issue (no signs or symptoms).    Duration  Progress to 30 minutes of continuous aerobic without signs/symptoms of physical distress Progress to 30 minutes of continuous aerobic without signs/symptoms of physical distress Progress to 50 minutes of aerobic without signs/symptoms of physical distress Progress to 50 minutes of aerobic without signs/symptoms of physical distress   Intensity  Rest + 30 Rest + 30 THRR unchanged THRR unchanged   Progression  Continue progressive overload as per policy without  signs/symptoms or physical distress. Continue progressive overload as per policy without signs/symptoms or physical distress. Continue progressive overload as per policy without signs/symptoms or physical distress. Continue progressive overload as per policy without signs/symptoms or physical distress.   Resistance Training   Training Prescription Yes Yes Yes Yes Yes   Weight 2 2 2 3 3    Reps 10-12 10-12 10-12 10-12 10-12   Treadmill   MPH 1 1 1.5 1.8 2.1   Grade 0 0 0 0 0   Minutes 10 10 15 15 15    NuStep   Level 2 2 2 3 5    Watts 40 40 40 40 60   Minutes 10 15 15 15 15    REL-XR   Level 2 2 3 3 6    Watts 40 40 55 60 70   Minutes 10 10 15 15 15       Discharge Exercise Prescription (Final Exercise Prescription Changes):     Exercise Prescription Changes - 03/14/15 1100    Exercise Review   Progression Yes   Response to Exercise   Symptoms None   Comments  Reviewed individualized exercise prescription and made increases per departmental policy. Exercise increases were discussed with the patient and they were able to perform the new work loads without issue (no signs or symptoms).    Duration Progress to 50 minutes of aerobic without signs/symptoms of physical distress   Intensity THRR unchanged   Progression Continue progressive overload as per policy without signs/symptoms or physical distress.   Resistance Training   Training Prescription Yes   Weight 3   Reps 10-12   Treadmill   MPH 2.1   Grade 0   Minutes 15   NuStep   Level 5   Watts 60   Minutes 15   REL-XR   Level 6   Watts 70   Minutes 15       Nutrition:  Target Goals: Understanding of nutrition guidelines, daily intake of sodium <158m, cholesterol <2086m calories 30% from fat and 7% or less from saturated fats, daily to have 5 or more servings of fruits and vegetables.  Biometrics:     Pre Biometrics - 02/18/15 1819    Pre Biometrics   Height 5' 9"  (1.753 m)   Weight 261 lb 4.8 oz (118.525 kg)    Waist Circumference 48 inches   Hip Circumference 53.5 inches   Waist to Hip Ratio 0.9 %   BMI (Calculated) 38.7       Nutrition Therapy Plan and Nutrition Goals:     Nutrition Therapy & Goals - 02/18/15 1340    Nutrition Therapy   Diet Mr TuMeirings interested in meeting the dietitian. His wife does all the cooking - they like vegtables and salads. He does not eat much sweets, but he does not watch his portion sizes.      Nutrition Discharge: Rate Your Plate Scores:   Psychosocial: Target Goals: Acknowledge presence or absence of depression, maximize coping skills, provide positive support system. Participant is able to verbalize types and ability to use techniques and skills needed for reducing stress and depression.  Initial Review & Psychosocial Screening:     Initial Psych Review & Screening - 02/18/15 [place]   Comments Mr TuJudayas good family support. He states he misses the activity he use to do before the COPD, but he is not depressed.   Barriers   Psychosocial barriers to participate in program There are no identifiable barriers or psychosocial needs.;The patient should benefit from training in stress management and relaxation.   Screening Interventions   Interventions Encouraged to exercise      Quality of Life Scores:     Quality of Life - 02/18/15 1340    Quality of Life Scores   Health/Function Pre 13.13 %   Socioeconomic Pre 28.44 %   Psych/Spiritual Pre 30 %   Family Pre 25.2 %   GLOBAL Pre 21.49 %      PHQ-9:     Recent Review Flowsheet Data    [facility]   Decreased Interest 0   Down, Depressed, Hopeless 0   PHQ - 2 Score 0      Psychosocial Evaluation and Intervention:   Psychosocial Re-Evaluation:  Education: Education Goals: Education classes will be provided on a weekly basis, covering required topics. Participant will state understanding/return demonstration  of topics presented.  Learning Barriers/Preferences:     Learning Barriers/Preferences - 02/18/15 1340    Learning Barriers/Preferences   Learning Barriers None   Learning Preferences Group Instruction;Individual  Instruction;Pictoral;Skilled Demonstration;Written Material;Verbal Instruction;Video      Education Topics: Initial Evaluation Education: - Verbal, written and demonstration of respiratory meds, RPE/PD scales, oximetry and breathing techniques. Instruction on use of nebulizers and MDIs: cleaning and proper use, rinsing mouth with steroid doses and importance of monitoring MDI activations.          Pulmonary Rehab from 02/18/2015 in [place]   Date  02/18/15   Educator  LB   Instruction Review Code  2- meets goals/outcomes      General Nutrition Guidelines/Fats and Fiber: -Group instruction provided by verbal, written material, models and posters to present the general guidelines for heart healthy nutrition. Gives an explanation and review of dietary fats and fiber.   Controlling Sodium/Reading Food Labels: -Group verbal and written material supporting the discussion of sodium use in heart healthy nutrition. Review and explanation with models, verbal and written materials for utilization of the food label.   Exercise Physiology & Risk Factors: - Group verbal and written instruction with models to review the exercise physiology of the cardiovascular system and associated critical values. Details cardiovascular disease risk factors and the goals associated with each risk factor.   Aerobic Exercise & Resistance Training: - Gives group verbal and written discussion on the health impact of inactivity. On the components of aerobic and resistive training programs and the benefits of this training and how to safely progress through these programs.   Flexibility, Balance, General Exercise Guidelines: - Provides group verbal and written  instruction on the benefits of flexibility and balance training programs. Provides general exercise guidelines with specific guidelines to those with heart or lung disease. Demonstration and skill practice provided.   Stress Management: - Provides group verbal and written instruction about the health risks of elevated stress, cause of high stress, and healthy ways to reduce stress.   Depression: - Provides group verbal and written instruction on the correlation between heart/lung disease and depressed mood, treatment options, and the stigmas associated with seeking treatment.      Pulmonary Rehab from 03/14/2015 in [facility] Cardiac and Pulmonary Rehab   Date  03/05/15   Educator  Berle Mull   Instruction Review Code  2- meets goals/outcomes      Exercise & Equipment Safety: - Individual verbal instruction and demonstration of equipment use and safety with use of the equipment.      Pulmonary Rehab from 03/14/2015 in [facility] Cardiac and Pulmonary Rehab   Date  02/26/15   Educator  C. Enterkin,RN   Instruction Review Code  1- partially meets, needs review/practice      Infection Prevention: - Provides verbal and written material to individual with discussion of infection control including proper hand washing and proper equipment cleaning during exercise session.      Pulmonary Rehab from 03/14/2015 in [facility] Cardiac and Pulmonary Rehab   Date  02/26/15   Educator  C. [place]   Instruction Review Code  1- partially meets, needs review/practice      Falls Prevention: - Provides verbal and written material to individual with discussion of falls prevention and safety.      Pulmonary Rehab from 03/14/2015 in [facility] Cardiac and Pulmonary Rehab   Date  02/26/15   Educator  C. Enterkin,RN   Instruction Review Code  1- partially meets, needs review/practice      Diabetes: - Individual verbal and written instruction to review signs/symptoms of diabetes, desired ranges of glucose level fasting,  after meals and with exercise.  Advice that pre and post exercise glucose checks will be done for 3 sessions at entry of program.      Pulmonary Rehab from 03/14/2015 in [facility] Cardiac and Pulmonary Rehab   Date  02/26/15   Educator  C. EnterkinRN [employer] said he did not check his blood sugar before [place]   Instruction Review Code  1- partially meets, needs review/practice [Iyad reports that he has had diabetes for about 20 years. ]      Chronic Lung Diseases: - Group verbal and written instruction to review new updates, new respiratory medications, new advancements in procedures and treatments. Provide informative websites and "800" numbers of self-education.   Lung Procedures: - Group verbal and written instruction to describe testing methods done to diagnose lung disease. Review the outcome of test results. Describe the treatment choices: Pulmonary Function Tests, ABGs and oximetry.   Energy Conservation: - Provide group verbal and written instruction for methods to conserve energy, plan and organize activities. Instruct on pacing techniques, use of adaptive equipment and posture/positioning to relieve shortness of breath.   Triggers: - Group verbal and written instruction to review types of environmental controls: home humidity, furnaces, filters, dust mite/pet prevention, HEPA vacuums. To discuss weather changes, air quality and the benefits of nasal washing.      Pulmonary Rehab from 03/14/2015 in [facility] Cardiac and Pulmonary Rehab   Date  03/10/15   Educator  LB   Instruction Review Code  2- meets goals/outcomes      Exacerbations: - Group verbal and written instruction to provide: warning signs, infection symptoms, calling MD promptly, preventive modes, and value of vaccinations. Review: effective airway clearance, coughing and/or vibration techniques. Create an Sports administrator.   Oxygen: - Individual and group verbal and written instruction on oxygen therapy. Includes supplement  oxygen, available portable oxygen systems, continuous and intermittent flow rates, oxygen safety, concentrators, and Medicare reimbursement for oxygen.   Respiratory Medications: - Group verbal and written instruction to review medications for lung disease. Drug class, frequency, complications, importance of spacers, rinsing mouth after steroid MDI's, and proper cleaning methods for nebulizers.      Pulmonary Rehab from 02/18/2015 in [place]   Date  02/18/15   Educator  LB   Instruction Review Code  2- meets goals/outcomes      AED/CPR: - Group verbal and written instruction with the use of models to demonstrate the basic use of the AED with the basic ABC's of resuscitation.   Breathing Retraining: - Provides individuals verbal and written instruction on purpose, frequency, and proper technique of diaphragmatic breathing and pursed-lipped breathing. Applies individual practice skills.      Pulmonary Rehab from 03/14/2015 in [facility] Cardiac and Pulmonary Rehab   Date  02/26/15   Educator  C. EnterkinRN   Instruction Review Code  1- partially meets, needs review/practice [Firman said would start coughing alot since his R nare conges]      Anatomy and Physiology of the Lungs: - Group verbal and written instruction with the use of models to provide basic lung anatomy and physiology related to function, structure and complications of lung disease.   Heart Failure: - Group verbal and written instruction on the basics of heart failure: signs/symptoms, treatments, explanation of ejection fraction, enlarged heart and cardiomyopathy.      Pulmonary Rehab from 03/14/2015 in [facility] Cardiac and Pulmonary Rehab   Date  03/14/15   Educator  [place]   Instruction Review Code  2- meets  goals/outcomes      Sleep Apnea: - Individual verbal and written instruction to review Obstructive Sleep Apnea. Review of risk factors, methods for diagnosing and types of masks and  machines for OSA.      Pulmonary Rehab from 02/18/2015 in [place]   Date  02/18/15   Educator  LB   Instruction Review Code  2- meets goals/outcomes      Anxiety: - Provides group, verbal and written instruction on the correlation between heart/lung disease and anxiety, treatment options, and management of anxiety.   Relaxation: - Provides group, verbal and written instruction about the benefits of relaxation for patients with heart/lung disease. Also provides patients with examples of relaxation techniques.   Knowledge Questionnaire Score:     Knowledge Questionnaire Score - 02/18/15 1340    Knowledge Questionnaire Score   Pre Score -2      Personal Goals and Risk Factors at Admission:     Personal Goals and Risk Factors at Admission - 02/18/15 1340    Personal Goals and Risk Factors on Admission    Weight Management Yes   Intervention Learn and follow the exercise and diet guidelines while in the program. Utilize the nutrition and education classes to help gain knowledge of the diet and exercise expectations in the program  Mr Jayson is interested in meeting the dietitian. His wife does all the cooking - they like vegtables and salads. He does not eat much sweets, but he does not watch his portion sizes.   Admit Weight 260 lb (117.935 kg)   Goal Weight 200 lb (90.719 kg)   Increase Aerobic Exercise and Physical Activity Yes   Intervention While in program, learn and follow the exercise prescription taught. Start at a low level workload and increase workload after able to maintain previous level for 30 minutes. Increase time before increasing intensity.  Mr Fernandez wants to improve his exercise capacity. He likes to do yardwork and wood working.   Quit Smoking Yes   Number of packs per day 2ppd for 41yr; he quit a week ago - can not use chantix and uses no other nicotine aid.   Intervention Utilize your health care professional team to  help with smoking cessation while in the program. Your doctor can prescribe medications to aid in cessation. The program can provide information and counseling as needed.   Understand more about Heart/Pulmonary Disease. Yes  Mr TBihmwould like to learn more about COPD and the daily management of the disease.   Intervention While in program utilize professionals for any questions, and attend the education sessions. Great websites to use are www.americanheart.org or www.lung.org for reliable information.  Reviewed Mr Hands's MDIs: Albuterol, Spiriva Respimat, and Symbicort. Instructed on aerochamber and gave him a new chamber.   Improve shortness of breath with ADL's Yes   Intervention While in program, learn and follow the exercise prescription taught. Start at a low level workload and increase workload ad advised by the exercise physiologist. Increase time before increasing intensity.  Mr TSwavelywould like to have a better understanding of his shortness of breath and less fear with it.    Develop more efficient breathing techniques such as purse lipped breathing and diaphragmatic breathing; and practicing self-pacing with activity Yes   Intervention While in program, learn and utilize the specific breathing techniques taught to you. Continue to practice and use the techniques as needed.  Instructed on PLB and it's benefits.   Increase knowledge of  respiratory medications and ability to use respiratory devices properly.  Yes   Intervention While in program, learn to administer MDI, nebulizer, and spacer properly.;Learn to take respiratory medicine as ordered.;While in program, learn to Clean MDI, nebulizers, and spacers properly.  Reviewed MDI's and gave Mr Traum a spacer.   Diabetes Yes   Goal Blood glucose control identified by blood glucose values, HgbA1C. Participant verbalizes understanding of the signs/symptoms of hyper/hypo glycemia, proper foot care and importance of medication and  nutrition plan for blood glucose control.   Intervention Provide nutrition & aerobic exercise along with prescribed medications to achieve blood glucose in normal ranges: Fasting 65-99 mg/dL   Hypertension Yes   Goal Participant will see blood pressure controlled within the values of 140/66m/Hg or within value directed by their physician.   Intervention Provide nutrition & aerobic exercise along with prescribed medications to achieve BP 140/90 or less.      Personal Goals and Risk Factors Review:      Goals and Risk Factor Review      02/26/15 1130 02/28/15 1301 03/05/15 1130 03/10/15 1130 03/10/15 1405   Weight Management   Goals Progress/Improvement seen  (p) Yes   Yes   Comments  (p) Has gone from 50 in pants to 48 inche pants.    Continues to stay tobacco free.  States is the hardest thing he has ever done and he is determined to stay quit.   Increase Aerobic Exercise and Physical Activity   Goals Progress/Improvement seen     Yes    Comments    Mr TDicenzostates he feels stronger when he is walking. He was motivated today on the treadmill to see the 1 mile marker!    Quit Smoking   Goals Progress/Improvement seen Yes  Yes     Comments Mr TWooleverhas not had a cigarrette since his quit date which is now about 2 weeks. He does not use any nicotine replacement.  Mr TGollais smoke free - no cigarrettes since his quit date.     Understand more about Heart/Pulmonary Disease   Goals Progress/Improvement seen     Yes    Comments    Mr TPautschattended the Trigger lecture today which had good tips on staying healthy with COPD by managing triggers in the home and outdoors with pollen and air pollution.    Improve shortness of breath with ADL's   Comments    Mr THackhas completed 2 weeks of exercise and has not noticed an improvement in his shortness of breath. As he progresses with his exercise, I am hoping he will have some change to his breathing.     Breathing Techniques   Comments    PLB  is a hard technique for Mr TAltamura I recommended he take a breath in through his mouth instead of his nose and then blow out of his mouth with pursed lips.    Increase knowledge of respiratory medications   Goals Progress/Improvement seen    Yes     Comments   Mr TQuesinberrycarries his Poventil inhaler with him and did use it before [name] and after the treadmill. His exercise goal did increase to 183ms and 1.31m58m     Diabetes   Goal  (p) Blood glucose control identified by blood glucose values, HgbA1C. Participant verbalizes understanding of the signs/symptoms of hyper/hypo glycemia, proper foot care and importance of medication and nutrition plan for blood glucose control.  Personal Goals Discharge (Final Personal Goals and Risk Factors Review):      Goals and Risk Factor Review - 03/10/15 1405    Weight Management   Goals Progress/Improvement seen Yes   Comments Continues to stay tobacco free.  States is the hardest thing he has ever done and he is determined to stay quit.      ITP Comments:     ITP Comments      02/26/15 1130 02/28/15 1149 03/07/15 1241 03/10/15 1301 03/14/15 1141   ITP Comments Chronic pain level "6"; unchanged with exercise Personal and exercise goals expected to be met in 33 more sessions. Progress on specific individualized goals will be charted in patient's ITP. Upon completion of the program the patient will be comfortable managing exercise goals and progression on their own.  Personal and exercise goals expected to be met in 31 more sessions. Progress on specific individualized goals will be charted in patient's ITP. Upon completion of the program the patient will be comfortable managing exercise goals and progression on their own.  Personal and Exercsie goals expeted to be met in 30   more sessions. Progress on specific goals will be charted in the ITP.  Personal and exercise goals expected to be met in 29 more sessions. Progress on specific individualized  goals will be charted in patient's ITP. Upon completion of the program the patient will be comfortable managing exercise goals and progression on their own.       Comments: 30 day note review

## 2015-03-17 NOTE — Progress Notes (Signed)
Daily Session Note  Patient Details  Name: TAMER BAUGHMAN MRN: [id number] Date of Birth: Oct 29, 1945 Referring Provider:  Delorise Shiner, MD  Encounter Date: 03/17/2015  Check In:     Session Check In - 03/17/15 1237    Check-In   Staff Present [name], BS, RRT, Respiratory Therapist;Samora Jernberg, RN, BSN;Steven Way, BS, ACSM EP-C, Exercise Physiologist   ER physicians immediately available to respond to emergencies [name] immediately available ER MD   Physician(s) Dr. Joni Fears and Dr. Jimmye Norman   Medication changes reported     No   Fall or balance concerns reported    No   Warm-up and Cool-down Performed on first and last piece of equipment   VAD Patient? No   Pain Assessment   Currently in Pain? No/denies   Pain Location Buttocks         Goals Met:  Proper associated with RPD/PD & O2 Sat Exercise tolerated well  Goals Unmet:  Not Applicable  Goals Comments:    Dr. Emily Filbert is Medical Director for [place] and [name].

## 2015-03-19 ENCOUNTER — Encounter: Payer: Non-veteran care | Admitting: *Deleted

## 2015-03-19 DIAGNOSIS — J449 Chronic obstructive pulmonary disease, unspecified: Secondary | ICD-10-CM | POA: Diagnosis not present

## 2015-03-19 NOTE — Progress Notes (Signed)
Daily Session Note  Patient Details  Name: Jeffrey Burgess MRN: [id number] Date of Birth: 12-Oct-1945 Referring Provider:  Delorise Shiner, MD  Encounter Date: 03/19/2015  Check In:     Session Check In - 03/19/15 1154    Check-In   Staff Present Gerlene Burdock, RN, BSN;Laureen Owens Shark, BS, RRT, Respiratory Therapist;Steven Way, BS, ACSM EP-C, Exercise Physiologist   ER physicians immediately available to respond to emergencies [name] immediately available ER MD   Physician(s) Dr. Clearnce Hasten and Dr. Mariea Clonts   Medication changes reported     No   Fall or balance concerns reported    No   VAD Patient? No   Pain Assessment   Currently in Pain? No/denies         Goals Met:  Proper associated with RPD/PD & O2 Sat Exercise tolerated well  Goals Unmet:  Not Applicable  Goals Comments: Expected to achieve goals by 36 sessions and currenlty has 27 more sessions to go.    Dr. Emily Filbert is Medical Director for [facility] and [name].

## 2015-03-21 ENCOUNTER — Encounter: Payer: Non-veteran care | Admitting: *Deleted

## 2015-03-21 DIAGNOSIS — J449 Chronic obstructive pulmonary disease, unspecified: Secondary | ICD-10-CM

## 2015-03-21 NOTE — Progress Notes (Signed)
Daily Session Note  Patient Details  Name: Jeffrey Burgess MRN: [id number] Date of Birth: [birth year] Referring Provider:  Delorise Shiner, MD  Encounter Date: 03/21/2015  Check In:     Session Check In - 03/21/15 1149    Check-In   Staff Present Frederich Cha, RRT, RCP, Respiratory Therapist;Quamesha Mullet Dillard Essex, MS, ACSM CEP, Exercise Physiologist;Carroll Enterkin, RN, BSN   ER physicians immediately available to respond to emergencies [name] immediately available ER MD   Physician(s) Marcelene Butte and Paduchowski   Medication changes reported     No   Fall or balance concerns reported    No   Warm-up and Cool-down Performed on first and last piece of equipment   VAD Patient? No   Pain Assessment   Currently in Pain? No/denies   Multiple Pain Sites No           Exercise Prescription Changes - 03/21/15 1100    Exercise Review   Progression Yes   Response to Exercise   Symptoms None   Comments [name] can exercise for full class time, therefore efforts of progression will be achieved through interval training and continual challenges through higher workloads   Duration Progress to 30 minutes of continuous aerobic without signs/symptoms of physical distress   Intensity THRR unchanged   Progression Continue progressive overload as per policy without signs/symptoms or physical distress.   Resistance Training   Training Prescription Yes   Weight 4   Reps 10-12   Interval Training   Interval Training Yes   Equipment REL-XR   Comments L3/60W for 3 min followed by L7/0W for 1 minute, repeat for 15 minutes continuously   Treadmill   MPH 2.1   Grade 0   Minutes 15   NuStep   Level 5   Watts 60   Minutes 15   REL-XR   Level 7   Watts 80   Minutes 15      Goals Met:  Proper associated with RPD/PD & O2 Sat Independence with exercise equipment Using PLB without cueing & demonstrates good technique Exercise tolerated well Personal goals reviewed Strength training completed  today  Goals Unmet:  Not Applicable  Goals Comments: Patient completed exercise prescription and all exercise goals during rehab session. The exercise was tolerated well and the patient is progressing in the program.    Dr. Emily Filbert is Medical Director for [place] and [name].

## 2015-03-24 ENCOUNTER — Encounter: Payer: Non-veteran care | Admitting: *Deleted

## 2015-03-24 DIAGNOSIS — J449 Chronic obstructive pulmonary disease, unspecified: Secondary | ICD-10-CM | POA: Diagnosis not present

## 2015-03-24 NOTE — Progress Notes (Signed)
Daily Session Note  Patient Details  Name: Jeffrey Burgess MRN: [id number] Date of Birth: [birth year] Referring Provider:  Delorise Shiner, MD  Encounter Date: 03/24/2015  Check In:     Session Check In - 03/24/15 1219    Check-In   Staff Present [name], BS, RRT, Respiratory Therapist;Clarissa Laird, RN, BSN;Steven Way, BS, ACSM EP-C, Exercise Physiologist   ER physicians immediately available to respond to emergencies [name] immediately available ER MD   Physician(s) Dr. Thomasene Lot and Archie Balboa   Medication changes reported     No   Fall or balance concerns reported    No   Warm-up and Cool-down Performed on first and last piece of equipment   VAD Patient? No   Pain Assessment   Currently in Pain? No/denies         Goals Met:  Proper associated with RPD/PD & O2 Sat Exercise tolerated well  Goals Unmet:  Not Applicable  Goals Comments:    Dr. Emily Filbert is Medical Director for [place] and [name].

## 2015-03-26 ENCOUNTER — Encounter: Payer: Non-veteran care | Attending: Family Medicine | Admitting: *Deleted

## 2015-03-26 DIAGNOSIS — J449 Chronic obstructive pulmonary disease, unspecified: Secondary | ICD-10-CM | POA: Insufficient documentation

## 2015-03-26 NOTE — Progress Notes (Signed)
Pulmonary Individual Treatment Plan  Patient Details  Name: Jeffrey Burgess MRN: [id number] Date of Birth: [birth year] Referring Provider:  Delorise Shiner, MD  Initial Encounter Date:    Visit Diagnosis: Chronic obstructive pulmonary disease, unspecified COPD type ([place])  Patient's Home Medications on Admission:  Current outpatient prescriptions:  .  acetaminophen (TYLENOL) 500 MG tablet, Take 500 mg by mouth., Disp: , Rfl:  .  albuterol (PROAIR HFA) 108 (90 Base) MCG/ACT inhaler, Inhale 2 puffs into the lungs., Disp: , Rfl:  .  aspirin EC 81 MG tablet, Take 81 mg by mouth., Disp: , Rfl:  .  beclomethasone (BECONASE-AQ) 42 MCG/SPRAY nasal spray, Place 2 sprays into the nose., Disp: , Rfl:  .  budesonide-formoterol (SYMBICORT) 160-4.5 MCG/ACT inhaler, Inhale 2 puffs into the lungs., Disp: , Rfl:  .  buPROPion (WELLBUTRIN) 100 MG tablet, Take 100 mg by mouth., Disp: , Rfl:  .  busPIRone (BUSPAR) 10 MG tablet, Take 10 mg by mouth., Disp: , Rfl:  .  Cholecalciferol (VITAMIN D3 SUPER STRENGTH) 2000 units TABS, Take 2,000 Units by mouth., Disp: , Rfl:  .  docusate sodium (STOOL SOFTENER) 100 MG capsule, Take 100 mg by mouth., Disp: , Rfl:  .  fluticasone (FLONASE) 50 MCG/ACT nasal spray, Place 1 spray into the nose., Disp: , Rfl:  .  insulin aspart protamine - aspart (NOVOLOG 70/30 MIX) (70-30) 100 UNIT/ML FlexPen, Inject 18 Units into the skin., Disp: , Rfl:  .  metFORMIN (GLUCOPHAGE) 500 MG tablet, Take 500 mg by mouth., Disp: , Rfl:  .  metoprolol tartrate (LOPRESSOR) 25 MG tablet, Take 25 mg by mouth., Disp: , Rfl:  .  montelukast (SINGULAIR) 10 MG tablet, Take 10 mg by mouth., Disp: , Rfl:  .  nitroGLYCERIN (NITROSTAT) 0.4 MG SL tablet, Place 0.4 mg under the tongue., Disp: , Rfl:  .  Omega-3 1000 MG CAPS, Take 2 g by mouth., Disp: , Rfl:  .  pramipexole (MIRAPEX) 0.5 MG tablet, Take 0.5 mg by mouth., Disp: , Rfl:  .  pregabalin (LYRICA) 100 MG capsule, Take 100 mg by mouth., Disp: , Rfl:   .  ranitidine (ZANTAC) 150 MG capsule, Take 150 mg by mouth., Disp: , Rfl:  .  rosuvastatin (CRESTOR) 20 MG tablet, Take 20 mg by mouth., Disp: , Rfl:  .  tiotropium (SPIRIVA) 18 MCG inhalation capsule, Place 18 mcg into inhaler and inhale., Disp: , Rfl:  .  VITAMIN B COMPLEX-C CAPS, Take 1 tablet by mouth., Disp: , Rfl:  .  vitamin E 400 UNIT capsule, Take 400 Units by mouth., Disp: , Rfl:   Past Medical History: Past Medical History  Diagnosis Date  . Asthma   . Rheumatoid arthritis ([place])   . Hyperlipidemia   . Hypertension   . Sleep apnea   . Myocardial infarction ([place])   . Diabetes ([place])     Tobacco Use: History  Smoking status  . Former Smoker -- 1.00 packs/day for 57 years  . Types: Cigarettes  . Quit date: 02/10/2015  Smokeless tobacco  . Former Geophysical data processor: Recent Review Flowsheet Data    There is no flowsheet data to display.         POCT Glucose      02/26/15 1130 03/05/15 1130 03/05/15 1531 03/05/15 1535     POCT Blood Glucose   Pre-Exercise 224 mg/dL  Breakfast one hour ago       Post-Exercise 193 mg/dL       Pre-Exercise #  2   268 mg/dL     Post-Exercise #2   233 mg/dL --  02/28/2015    Pre-Exercise #3  248 mg/dL      Post-Exercise #3  190 mg/dL         ADL UCSD:     ADL UCSD      02/18/15 1340       ADL UCSD   ADL Phase Entry     SOB Score total 65     Rest 1     Walk 2     Stairs 4     Bath 2     Dress 2     Shop 4         Pulmonary Function Assessment:     Pulmonary Function Assessment - 02/18/15 1340    Initial Spirometry Results   FVC% 62 %   FEV1% 43 %   FEV1/FVC Ratio 55.33   Post Bronchodilator Spirometry Results   FVC% 54 %   FEV1% 62 %   FEV1/FVC Ratio 71.51   Breath   Bilateral Breath Sounds Clear;Decreased   Shortness of Breath Yes;Limiting activity;Fear of Shortness of Breath      Exercise Target Goals:    Exercise Program Goal: Individual exercise prescription set with THRR, safety & activity  barriers. Participant demonstrates ability to understand and report RPE using BORG scale, to self-measure pulse accurately, and to acknowledge the importance of the exercise prescription.  Exercise Prescription Goal: Starting with aerobic activity 30 plus minutes a day, 3 days per week for initial exercise prescription. Provide home exercise prescription and guidelines that participant acknowledges understanding prior to discharge.  Activity Barriers & Risk Stratification:     Activity Barriers & Risk Stratification - 02/18/15 1340    Activity Barriers & Risk Stratification   Activity Barriers Assistive Device;Deconditioning;Muscular Weakness;Balance Concerns   Risk Stratification Moderate      6 Minute Walk:     6 Minute Walk      02/18/15 1817       6 Minute Walk   Phase Initial     Distance 802 feet     Walk Time 6 minutes     Resting HR 88 bpm     Resting BP 136/72 mmHg     Max Ex. HR 86 bpm     Max Ex. BP 146/72 mmHg     RPE 14     Perceived Dyspnea  3     Symptoms No        Initial Exercise Prescription:     Initial Exercise Prescription - 02/18/15 1800    Date of Initial Exercise Prescription   Date 02/18/15   Treadmill   MPH 1   Grade 0   Minutes 10   Recumbant Bike   Level 2   RPM 40   Watts 20   Minutes 10   NuStep   Level 2   Watts 40   Minutes 10   Arm Ergometer   Level 1   Watts 10   Minutes 10   Recumbant Elliptical   Level 2   RPM 40   Watts 20   Minutes 10   REL-XR   Level 2   Watts 40   Minutes 10   Biostep-RELP   Level 2   Watts 40   Minutes 10   Prescription Details   Frequency (times per week) 3   Duration Progress to 30 minutes of continuous aerobic without signs/symptoms of physical distress  Intensity   THRR REST +  30   Ratings of Perceived Exertion 11-15   Perceived Dyspnea 2-4   Progression Continue progressive overload as per policy without signs/symptoms or physical distress.   Resistance Training   Training  Prescription Yes   Weight 2   Reps 10-15      Exercise Prescription Changes:     Exercise Prescription Changes      02/26/15 1500 02/28/15 1100 03/05/15 1200 03/10/15 1600 03/14/15 1100   Exercise Review   Progression  Yes Yes Yes Yes   Response to Exercise   Blood Pressure (Admit) 130/78 mmHg   140/70 mmHg    Blood Pressure (Exercise) 158/82 mmHg   168/88 mmHg    Blood Pressure (Exit) 120/70 mmHg   136/76 mmHg    Heart Rate (Admit) 74 bpm   85 bpm    Heart Rate (Exercise) 100 bpm   115 bpm    Heart Rate (Exit) 86 bpm   89 bpm    Oxygen Saturation (Admit) 97 %   96 %    Oxygen Saturation (Exercise) 96 %   97 %    Oxygen Saturation (Exit) 92 %   94 %    Rating of Perceived Exertion (Exercise) 12   12    Perceived Dyspnea (Exercise) 3   4    Symptoms  None None none None   Comments  Reviewed individualized exercise prescription and made increases per departmental policy. Exercise increases were discussed with the patient and they were able to perform the new work loads without issue (no signs or symptoms).  Cannen is progressing well in the program. He is gaining strength and stamin and this is reflected in higher workloads and longer time.   Reviewed individualized exercise prescription and made increases per departmental policy. Exercise increases were discussed with the patient and they were able to perform the new work loads without issue (no signs or symptoms).    Duration  Progress to 30 minutes of continuous aerobic without signs/symptoms of physical distress Progress to 30 minutes of continuous aerobic without signs/symptoms of physical distress Progress to 50 minutes of aerobic without signs/symptoms of physical distress Progress to 50 minutes of aerobic without signs/symptoms of physical distress   Intensity  Rest + 30 Rest + 30 THRR unchanged THRR unchanged   Progression  Continue progressive overload as per policy without signs/symptoms or physical distress. Continue progressive  overload as per policy without signs/symptoms or physical distress. Continue progressive overload as per policy without signs/symptoms or physical distress. Continue progressive overload as per policy without signs/symptoms or physical distress.   Resistance Training   Training Prescription Yes Yes Yes Yes Yes   Weight 2 2 2 3 3    Reps 10-12 10-12 10-12 10-12 10-12   Treadmill   MPH 1 1 1.5 1.8 2.1   Grade 0 0 0 0 0   Minutes 10 10 15 15 15    NuStep   Level 2 2 2 3 5    Watts 40 40 40 40 60   Minutes 10 15 15 15 15    REL-XR   Level 2 2 3 3 6    Watts 40 40 55 60 70   Minutes 10 10 15 15 15      03/19/15 1500 03/21/15 1100 03/24/15 1200       Exercise Review   Progression Yes Yes Yes     Response to Exercise   Symptoms  None None     Comments  Lark can exercise for full class time, therefore efforts of progression will be achieved through interval training and continual challenges through higher workloads Awad can exercise for full class time, therefore efforts of progression will be achieved through interval training and continual challenges through higher workloads     Duration  Progress to 30 minutes of continuous aerobic without signs/symptoms of physical distress Progress to 30 minutes of continuous aerobic without signs/symptoms of physical distress     Intensity  THRR unchanged THRR unchanged     Progression  Continue progressive overload as per policy without signs/symptoms or physical distress. Continue progressive overload as per policy without signs/symptoms or physical distress.     Resistance Training   Training Prescription Yes Yes Yes     Weight 4 4 4      Reps 10-12 10-12 10-12     Interval Training   Interval Training  Yes Yes     Equipment  REL-XR REL-XR     Comments  L3/60W for 3 min followed by L7/0W for 1 minute, repeat for 15 minutes continuously L3/60W for 3 min followed by L7/0W for 1 minute, repeat for 15 minutes continuously     Treadmill   MPH 2.1 2.1 2.1      Grade 0 0 1     Minutes 15 15 15      NuStep   Level 5 5 5      Watts 60 60 60     Minutes 15 15 15      REL-XR   Level 6 7 7      Watts 70 80 80     Minutes 15 15 15         Discharge Exercise Prescription (Final Exercise Prescription Changes):     Exercise Prescription Changes - 03/24/15 1200    Exercise Review   Progression Yes   Response to Exercise   Symptoms None   Comments Oleg can exercise for full class time, therefore efforts of progression will be achieved through interval training and continual challenges through higher workloads   Duration Progress to 30 minutes of continuous aerobic without signs/symptoms of physical distress   Intensity THRR unchanged   Progression Continue progressive overload as per policy without signs/symptoms or physical distress.   Resistance Training   Training Prescription Yes   Weight 4   Reps 10-12   Interval Training   Interval Training Yes   Equipment REL-XR   Comments L3/60W for 3 min followed by L7/0W for 1 minute, repeat for 15 minutes continuously   Treadmill   MPH 2.1   Grade 1   Minutes 15   NuStep   Level 5   Watts 60   Minutes 15   REL-XR   Level 7   Watts 80   Minutes 15       Nutrition:  Target Goals: Understanding of nutrition guidelines, daily intake of sodium <1542m, cholesterol <2023m calories 30% from fat and 7% or less from saturated fats, daily to have 5 or more servings of fruits and vegetables.  Biometrics:     Pre Biometrics - 02/18/15 1819    Pre Biometrics   Height 5' 9"  (1.753 m)   Weight 261 lb 4.8 oz (118.525 kg)   Waist Circumference 48 inches   Hip Circumference 53.5 inches   Waist to Hip Ratio 0.9 %   BMI (Calculated) 38.7       Nutrition Therapy Plan and Nutrition Goals:     Nutrition Therapy & Goals - 02/18/15 1340  Nutrition Therapy   Diet Mr Dahlem is interested in meeting the dietitian. His wife does all the cooking - they like vegtables and salads. He does not eat  much sweets, but he does not watch his portion sizes.      Nutrition Discharge: Rate Your Plate Scores:   Psychosocial: Target Goals: Acknowledge presence or absence of depression, maximize coping skills, provide positive support system. Participant is able to verbalize types and ability to use techniques and skills needed for reducing stress and depression.  Initial Review & Psychosocial Screening:     Initial Psych Review & Screening - 02/18/15 [place]? Yes   Comments Mr Randle has good family support. He states he misses the activity he use to do before the COPD, but he is not depressed.   Barriers   Psychosocial barriers to participate in program There are no identifiable barriers or psychosocial needs.;The patient should benefit from training in stress management and relaxation.   Screening Interventions   Interventions Encouraged to exercise      Quality of Life Scores:     Quality of Life - 02/18/15 1340    Quality of Life Scores   Health/Function Pre 13.13 %   Socioeconomic Pre 28.44 %   Psych/Spiritual Pre 30 %   Family Pre 25.2 %   GLOBAL Pre 21.49 %      PHQ-9:     Recent Review Flowsheet Data    Depression screen [facility] 2/9 02/18/2015   Decreased Interest 0   Down, Depressed, Hopeless 0   PHQ - 2 Score 0      Psychosocial Evaluation and Intervention:   Psychosocial Re-Evaluation:  Education: Education Goals: Education classes will be provided on a weekly basis, covering required topics. Participant will state understanding/return demonstration of topics presented.  Learning Barriers/Preferences:     Learning Barriers/Preferences - 02/18/15 1340    Learning Barriers/Preferences   Learning Barriers None   Learning Preferences Group Instruction;Individual Instruction;Pictoral;Skilled Demonstration;Written Material;Verbal Instruction;Video      Education Topics: Initial Evaluation Education: - Verbal,  written and demonstration of respiratory meds, RPE/PD scales, oximetry and breathing techniques. Instruction on use of nebulizers and MDIs: cleaning and proper use, rinsing mouth with steroid doses and importance of monitoring MDI activations.          Pulmonary Rehab from 02/18/2015 in [place]   Date  02/18/15   Educator  LB   Instruction Review Code  2- meets goals/outcomes      General Nutrition Guidelines/Fats and Fiber: -Group instruction provided by verbal, written material, models and posters to present the general guidelines for heart healthy nutrition. Gives an explanation and review of dietary fats and fiber.   Controlling Sodium/Reading Food Labels: -Group verbal and written material supporting the discussion of sodium use in heart healthy nutrition. Review and explanation with models, verbal and written materials for utilization of the food label.   Exercise Physiology & Risk Factors: - Group verbal and written instruction with models to review the exercise physiology of the cardiovascular system and associated critical values. Details cardiovascular disease risk factors and the goals associated with each risk factor.   Aerobic Exercise & Resistance Training: - Gives group verbal and written discussion on the health impact of inactivity. On the components of aerobic and resistive training programs and the benefits of this training and how to safely progress through these programs.      Pulmonary Rehab  from 03/19/2015 in [facility] Cardiac and Pulmonary Rehab   Date  03/19/15   Educator  SWay   Instruction Review Code  2- meets goals/outcomes      Flexibility, Balance, General Exercise Guidelines: - Provides group verbal and written instruction on the benefits of flexibility and balance training programs. Provides general exercise guidelines with specific guidelines to those with heart or lung disease. Demonstration and skill practice  provided.   Stress Management: - Provides group verbal and written instruction about the health risks of elevated stress, cause of high stress, and healthy ways to reduce stress.   Depression: - Provides group verbal and written instruction on the correlation between heart/lung disease and depressed mood, treatment options, and the stigmas associated with seeking treatment.      Pulmonary Rehab from 03/19/2015 in [facility] Cardiac and Pulmonary Rehab   Date  03/05/15   Educator  Berle Mull   Instruction Review Code  2- meets goals/outcomes      Exercise & Equipment Safety: - Individual verbal instruction and demonstration of equipment use and safety with use of the equipment.      Pulmonary Rehab from 03/19/2015 in [facility] Cardiac and Pulmonary Rehab   Date  02/26/15   Educator  C. Willodene Stallings,RN   Instruction Review Code  1- partially meets, needs review/practice      Infection Prevention: - Provides verbal and written material to individual with discussion of infection control including proper hand washing and proper equipment cleaning during exercise session.      Pulmonary Rehab from 03/19/2015 in [facility] Cardiac and Pulmonary Rehab   Date  02/26/15   Educator  C. [place]   Instruction Review Code  1- partially meets, needs review/practice      Falls Prevention: - Provides verbal and written material to individual with discussion of falls prevention and safety.      Pulmonary Rehab from 03/19/2015 in [facility] Cardiac and Pulmonary Rehab   Date  02/26/15   Educator  C. Aybree Lanyon,RN   Instruction Review Code  1- partially meets, needs review/practice      Diabetes: - Individual verbal and written instruction to review signs/symptoms of diabetes, desired ranges of glucose level fasting, after meals and with exercise. Advice that pre and post exercise glucose checks will be done for 3 sessions at entry of program.      Pulmonary Rehab from 03/19/2015 in [facility] Cardiac and Pulmonary Rehab    Date  02/26/15   Educator  C. EnterkinRN [employer] said he did not check his blood sugar before [place]   Instruction Review Code  1- partially meets, needs review/practice [Trystian reports that he has had diabetes for about 20 years. ]      Chronic Lung Diseases: - Group verbal and written instruction to review new updates, new respiratory medications, new advancements in procedures and treatments. Provide informative websites and "800" numbers of self-education.   Lung Procedures: - Group verbal and written instruction to describe testing methods done to diagnose lung disease. Review the outcome of test results. Describe the treatment choices: Pulmonary Function Tests, ABGs and oximetry.   Energy Conservation: - Provide group verbal and written instruction for methods to conserve energy, plan and organize activities. Instruct on pacing techniques, use of adaptive equipment and posture/positioning to relieve shortness of breath.   Triggers: - Group verbal and written instruction to review types of environmental controls: home humidity, furnaces, filters, dust mite/pet prevention, HEPA vacuums. To discuss weather changes, air quality and the benefits of  nasal washing.      Pulmonary Rehab from 03/19/2015 in [facility] Cardiac and Pulmonary Rehab   Date  03/10/15   Educator  LB   Instruction Review Code  2- meets goals/outcomes      Exacerbations: - Group verbal and written instruction to provide: warning signs, infection symptoms, calling MD promptly, preventive modes, and value of vaccinations. Review: effective airway clearance, coughing and/or vibration techniques. Create an Sports administrator.   Oxygen: - Individual and group verbal and written instruction on oxygen therapy. Includes supplement oxygen, available portable oxygen systems, continuous and intermittent flow rates, oxygen safety, concentrators, and Medicare reimbursement for oxygen.   Respiratory Medications: - Group verbal and  written instruction to review medications for lung disease. Drug class, frequency, complications, importance of spacers, rinsing mouth after steroid MDI's, and proper cleaning methods for nebulizers.      Pulmonary Rehab from 02/18/2015 in [place]   Date  02/18/15   Educator  LB   Instruction Review Code  2- meets goals/outcomes      AED/CPR: - Group verbal and written instruction with the use of models to demonstrate the basic use of the AED with the basic ABC's of resuscitation.   Breathing Retraining: - Provides individuals verbal and written instruction on purpose, frequency, and proper technique of diaphragmatic breathing and pursed-lipped breathing. Applies individual practice skills.      Pulmonary Rehab from 03/19/2015 in [facility] Cardiac and Pulmonary Rehab   Date  02/26/15   Educator  C. EnterkinRN   Instruction Review Code  1- partially meets, needs review/practice [Ryatt said would start coughing alot since his R nare conges]      Anatomy and Physiology of the Lungs: - Group verbal and written instruction with the use of models to provide basic lung anatomy and physiology related to function, structure and complications of lung disease.   Heart Failure: - Group verbal and written instruction on the basics of heart failure: signs/symptoms, treatments, explanation of ejection fraction, enlarged heart and cardiomyopathy.      Pulmonary Rehab from 03/19/2015 in [facility] Cardiac and Pulmonary Rehab   Date  03/14/15   Educator  [place]   Instruction Review Code  2- meets goals/outcomes      Sleep Apnea: - Individual verbal and written instruction to review Obstructive Sleep Apnea. Review of risk factors, methods for diagnosing and types of masks and machines for OSA.      Pulmonary Rehab from 02/18/2015 in [place]   Date  02/18/15   Educator  LB   Instruction Review Code  2- meets goals/outcomes       Anxiety: - Provides group, verbal and written instruction on the correlation between heart/lung disease and anxiety, treatment options, and management of anxiety.   Relaxation: - Provides group, verbal and written instruction about the benefits of relaxation for patients with heart/lung disease. Also provides patients with examples of relaxation techniques.   Knowledge Questionnaire Score:     Knowledge Questionnaire Score - 02/18/15 1340    Knowledge Questionnaire Score   Pre Score -2      Personal Goals and Risk Factors at Admission:     Personal Goals and Risk Factors at Admission - 02/18/15 1340    Personal Goals and Risk Factors on Admission    Weight Management Yes   Intervention Learn and follow the exercise and diet guidelines while in the program. Utilize the nutrition and education classes to help gain knowledge  of the diet and exercise expectations in the program  Mr Clenney is interested in meeting the dietitian. His wife does all the cooking - they like vegtables and salads. He does not eat much sweets, but he does not watch his portion sizes.   Admit Weight 260 lb (117.935 kg)   Goal Weight 200 lb (90.719 kg)   Increase Aerobic Exercise and Physical Activity Yes   Intervention While in program, learn and follow the exercise prescription taught. Start at a low level workload and increase workload after able to maintain previous level for 30 minutes. Increase time before increasing intensity.  Mr Kuhrt wants to improve his exercise capacity. He likes to do yardwork and wood working.   Quit Smoking Yes   Number of packs per day 2ppd for 40yr; he quit a week ago - can not use chantix and uses no other nicotine aid.   Intervention Utilize your health care professional team to help with smoking cessation while in the program. Your doctor can prescribe medications to aid in cessation. The program can provide information and counseling as needed.   Understand more about  Heart/Pulmonary Disease. Yes  Mr TPeixotowould like to learn more about COPD and the daily management of the disease.   Intervention While in program utilize professionals for any questions, and attend the education sessions. Great websites to use are www.americanheart.org or www.lung.org for reliable information.  Reviewed Mr Armbrust's MDIs: Albuterol, Spiriva Respimat, and Symbicort. Instructed on aerochamber and gave him a new chamber.   Improve shortness of breath with ADL's Yes   Intervention While in program, learn and follow the exercise prescription taught. Start at a low level workload and increase workload ad advised by the exercise physiologist. Increase time before increasing intensity.  Mr TKibbewould like to have a better understanding of his shortness of breath and less fear with it.    Develop more efficient breathing techniques such as purse lipped breathing and diaphragmatic breathing; and practicing self-pacing with activity Yes   Intervention While in program, learn and utilize the specific breathing techniques taught to you. Continue to practice and use the techniques as needed.  Instructed on PLB and it's benefits.   Increase knowledge of respiratory medications and ability to use respiratory devices properly.  Yes   Intervention While in program, learn to administer MDI, nebulizer, and spacer properly.;Learn to take respiratory medicine as ordered.;While in program, learn to Clean MDI, nebulizers, and spacers properly.  Reviewed MDI's and gave Mr TGranlunda spacer.   Diabetes Yes   Goal Blood glucose control identified by blood glucose values, HgbA1C. Participant verbalizes understanding of the signs/symptoms of hyper/hypo glycemia, proper foot care and importance of medication and nutrition plan for blood glucose control.   Intervention Provide nutrition & aerobic exercise along with prescribed medications to achieve blood glucose in normal ranges: Fasting 65-99 mg/dL    Hypertension Yes   Goal Participant will see blood pressure controlled within the values of 140/920mHg or within value directed by their physician.   Intervention Provide nutrition & aerobic exercise along with prescribed medications to achieve BP 140/90 or less.      Personal Goals and Risk Factors Review:      Goals and Risk Factor Review      02/26/15 1130 02/28/15 1301 03/05/15 1130 03/10/15 1130 03/10/15 1405   Weight Management   Goals Progress/Improvement seen  (p) Yes   Yes   Comments  (p) Has gone from 50 in pants to  48 inche pants.    Continues to stay tobacco free.  States is the hardest thing he has ever done and he is determined to stay quit.   Increase Aerobic Exercise and Physical Activity   Goals Progress/Improvement seen     Yes    Comments    Mr Magwood states he feels stronger when he is walking. He was motivated today on the treadmill to see the 1 mile marker!    Quit Smoking   Goals Progress/Improvement seen Yes  Yes     Comments Mr Baumgarten has not had a cigarrette since his quit date which is now about 2 weeks. He does not use any nicotine replacement.  Mr Ewart is smoke free - no cigarrettes since his quit date.     Understand more about Heart/Pulmonary Disease   Goals Progress/Improvement seen     Yes    Comments    Mr Peppard attended the Trigger lecture today which had good tips on staying healthy with COPD by managing triggers in the home and outdoors with pollen and air pollution.    Improve shortness of breath with ADL's   Comments    Mr Claire has completed 2 weeks of exercise and has not noticed an improvement in his shortness of breath. As he progresses with his exercise, I am hoping he will have some change to his breathing.     Breathing Techniques   Comments    PLB is a hard technique for Mr Gulyas. I recommended he take a breath in through his mouth instead of his nose and then blow out of his mouth with pursed lips.    Increase knowledge of respiratory  medications   Goals Progress/Improvement seen    Yes     Comments   Mr Brickle carries his Poventil inhaler with him and did use it before [name] and after the treadmill. His exercise goal did increase to 56mns and 1.516m.     Diabetes   Goal  (p) Blood glucose control identified by blood glucose values, HgbA1C. Participant verbalizes understanding of the signs/symptoms of hyper/hypo glycemia, proper foot care and importance of medication and nutrition plan for blood glucose control.        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      Quit Smoking   Goals Progress/Improvement seen Yes       Comments No cigarrettes since his quit date.       Improve shortness of breath with ADL's   Goals Progress/Improvement seen   Yes      Comments  Feels like his SOB is steadily improving, however, no large gains noted.      Breathing Techniques   Goals Progress/Improvement seen   Yes      Comments  LeCastonas been attempting to use correct  breathing techniques, however, does forget sometimes and notices when SOB increases.  Is trying to work on this daily.         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   Improve shortness of breath with ADL's   Goals Progress/Improvement seen  Yes   Comments Feels like his SOB is steadily improving, however, no large gains noted.   Breathing Techniques   Goals Progress/Improvement seen  Yes   Comments Wofford has been attempting to  use correct  breathing techniques, however, does forget sometimes and notices when SOB increases.  Is trying to work on this daily.      ITP Comments:     ITP Comments      02/26/15 1130 02/28/15 1149 03/07/15 1241 03/10/15 1301 03/14/15 1141   ITP Comments Chronic pain level "6"; unchanged with exercise Personal and exercise goals expected to be met in 33 more sessions. Progress on specific individualized goals will be charted in patient's ITP. Upon completion of the program the patient will be comfortable managing exercise goals and progression on their own.  Personal and exercise goals expected to be met in 31 more sessions. Progress on specific individualized goals will be charted in patient's ITP. Upon completion of the program the patient will be comfortable managing exercise goals and progression on their own.  Personal and Exercsie goals expeted to be met in 30   more sessions. Progress on specific goals will be charted in the ITP.  Personal and exercise goals expected to be met in 29 more sessions. Progress on specific individualized goals will be charted in patient's ITP. Upon completion of the program the patient will be comfortable managing exercise goals and progression on their own.      03/21/15 1150 03/21/15 1320 03/24/15 1219 03/26/15 1254     ITP Comments Personal and exercise goals expected to be met in 26 more sessions. Progress on specific individualized goals will be charted in patient's ITP. Upon completion of the program the patient will be comfortable managing exercise goals and progression on their own.  Mr. Pedley has a cut on his left pointer finger from a knife.  It is swollen and red, but has no puss.  He is going to the [place] today to have it looked at. Mr. Fant went to the [place]. He reported they gave him a tetanus shot and cleaned it out. He also reported they gave him 10 days of antibiotics. We discussed for him to possibly eat some yougart while he is taking antibiotics.  Owynn should achieve his goals by 25 more sessions.  Guillaume should complete his goals including exercise goals by session 36 and he is at session 13 today.        Comments: Rayaan should complete his goals including exercise goals by session 36 and he is at session 13 today.

## 2015-03-28 ENCOUNTER — Encounter: Payer: Non-veteran care | Admitting: *Deleted

## 2015-03-28 DIAGNOSIS — J449 Chronic obstructive pulmonary disease, unspecified: Secondary | ICD-10-CM | POA: Diagnosis not present

## 2015-03-28 NOTE — Progress Notes (Signed)
Daily Session Note  Patient Details  Name: Jeffrey Burgess MRN: [id number] Date of Birth: 02/09/1946 Referring Provider:  Delorise Shiner, MD  Encounter Date: 03/28/2015  Check In:     Session Check In - 03/28/15 1228    Check-In   Staff Present Candiss Norse, MS, ACSM CEP, Exercise Physiologist;Stacey Blanch Media, RRT, RCP, Respiratory Therapist;Carroll Enterkin, RN, BSN   ER physicians immediately available to respond to emergencies [name] immediately available ER MD   Physician(s) Jacqualine Code and Malinda   Medication changes reported     No   Fall or balance concerns reported    No   Warm-up and Cool-down Performed on first and last piece of equipment   VAD Patient? No   Pain Assessment   Currently in Pain? No/denies   Multiple Pain Sites No           Exercise Prescription Changes - 03/28/15 1200    Exercise Review   Progression Yes   Response to Exercise   Symptoms None   Comments Reviewed individualized exercise prescription and made increases per departmental policy. Exercise increases were discussed with the patient and they were able to perform the new work loads without issue (no signs or symptoms).    Duration Progress to 30 minutes of continuous aerobic without signs/symptoms of physical distress   Intensity THRR unchanged   Progression Continue progressive overload as per policy without signs/symptoms or physical distress.   Resistance Training   Training Prescription Yes   Weight 4   Reps 10-12   Interval Training   Interval Training Yes   Equipment REL-XR   Comments L3/60W for 3 min followed by L7/0W for 1 minute, repeat for 15 minutes continuously   Treadmill   MPH 2.1   Grade 1   Minutes 15   NuStep   Level 5   Watts 60   Minutes 15   REL-XR   Level 8   Watts 100   Minutes 15      Goals Met:  Proper associated with RPD/PD & O2 Sat Independence with exercise equipment Using PLB without cueing & demonstrates good technique Exercise tolerated  well Personal goals reviewed Strength training completed today  Goals Unmet:  Not Applicable  Goals Comments: Patient completed exercise prescription and all exercise goals during rehab session. The exercise was tolerated well and the patient is progressing in the program.    Dr. Emily Filbert is Medical Director for [facility] and [name].

## 2015-03-31 ENCOUNTER — Encounter: Payer: Non-veteran care | Admitting: *Deleted

## 2015-03-31 DIAGNOSIS — J449 Chronic obstructive pulmonary disease, unspecified: Secondary | ICD-10-CM

## 2015-03-31 NOTE — Progress Notes (Signed)
Daily Session Note  Patient Details  Name: Jeffrey Burgess MRN: [id number] Date of Birth: 10/03/1945 Referring Provider:  Delorise Shiner, MD  Encounter Date: 03/31/2015  Check In:     Session Check In - 03/31/15 1213    Check-In   Location [facility]   Staff Present [name], BS, RRT, Respiratory Therapist;Paulett Kaufhold, RN, Drusilla Kanner, MS, ACSM CEP, Exercise Physiologist   Supervising physician immediately available to respond to emergencies [name] immediately available ER MD   Physician(s) Dr. Clearnce Hasten and Dr. Kerman Passey   Medication changes reported     No   Fall or balance concerns reported    No   Warm-up and Cool-down Performed on first and last piece of equipment   Resistance Training Performed Yes   VAD Patient? No   Pain Assessment   Currently in Pain? No/denies         Goals Met:  Queuing for purse lip breathing  Goals Unmet:  RPE PD  Goals Comments: I advised Shayan to stop exercising and to check with his MD since he was awake all night coughing he reports.   Dr. Emily Filbert is Medical Director for [facility] and [name].

## 2015-03-31 NOTE — Progress Notes (Signed)
Daily Session Note  Patient Details  Name: SANAV REMER MRN: [id number] Date of Birth: [birth year] Referring Provider:  Delorise Shiner, MD  Encounter Date: 03/31/2015  Check In:     Session Check In - 03/31/15 1213    Check-In   Location [facility]   Staff Present [name], BS, RRT, Respiratory Therapist;Mikaia Janvier, RN, Drusilla Kanner, MS, ACSM CEP, Exercise Physiologist   Supervising physician immediately available to respond to emergencies [name] immediately available ER MD   Physician(s) Dr. Clearnce Hasten and Dr. Kerman Passey   Medication changes reported     No   Fall or balance concerns reported    No   Warm-up and Cool-down Performed on first and last piece of equipment   Resistance Training Performed Yes   VAD Patient? No   Pain Assessment   Currently in Pain? No/denies         Goals Met:  Proper associated with RPD/PD & O2 Sat Exercise tolerated well  Goals Unmet:  Not Applicable  Goals Comments:    Dr. Emily Filbert is Medical Director for [facility] and [name].

## 2015-04-02 ENCOUNTER — Telehealth: Payer: Self-pay | Admitting: *Deleted

## 2015-04-02 NOTE — Telephone Encounter (Signed)
Jeffrey Burgess left a vm on the Pulmonary Rehab vm that he is still sick and won't be in to exercise today.

## 2015-04-02 NOTE — Progress Notes (Signed)
Pulmonary Individual Treatment Plan  Patient Details  Name: HENDERSON FRAMPTON MRN: [id number] Date of Birth: Aug 18, 1945 Referring Provider:  Delorise Shiner, MD  Initial Encounter Date:    Visit Diagnosis: Chronic obstructive pulmonary disease, unspecified COPD type ([place])  Patient's Home Medications on Admission:  Current outpatient prescriptions:  .  acetaminophen (TYLENOL) 500 MG tablet, Take 500 mg by mouth., Disp: , Rfl:  .  albuterol (PROAIR HFA) 108 (90 Base) MCG/ACT inhaler, Inhale 2 puffs into the lungs., Disp: , Rfl:  .  aspirin EC 81 MG tablet, Take 81 mg by mouth., Disp: , Rfl:  .  beclomethasone (BECONASE-AQ) 42 MCG/SPRAY nasal spray, Place 2 sprays into the nose., Disp: , Rfl:  .  budesonide-formoterol (SYMBICORT) 160-4.5 MCG/ACT inhaler, Inhale 2 puffs into the lungs., Disp: , Rfl:  .  buPROPion (WELLBUTRIN) 100 MG tablet, Take 100 mg by mouth., Disp: , Rfl:  .  busPIRone (BUSPAR) 10 MG tablet, Take 10 mg by mouth., Disp: , Rfl:  .  Cholecalciferol (VITAMIN D3 SUPER STRENGTH) 2000 units TABS, Take 2,000 Units by mouth., Disp: , Rfl:  .  docusate sodium (STOOL SOFTENER) 100 MG capsule, Take 100 mg by mouth., Disp: , Rfl:  .  fluticasone (FLONASE) 50 MCG/ACT nasal spray, Place 1 spray into the nose., Disp: , Rfl:  .  insulin aspart protamine - aspart (NOVOLOG 70/30 MIX) (70-30) 100 UNIT/ML FlexPen, Inject 18 Units into the skin., Disp: , Rfl:  .  metFORMIN (GLUCOPHAGE) 500 MG tablet, Take 500 mg by mouth., Disp: , Rfl:  .  metoprolol tartrate (LOPRESSOR) 25 MG tablet, Take 25 mg by mouth., Disp: , Rfl:  .  montelukast (SINGULAIR) 10 MG tablet, Take 10 mg by mouth., Disp: , Rfl:  .  nitroGLYCERIN (NITROSTAT) 0.4 MG SL tablet, Place 0.4 mg under the tongue., Disp: , Rfl:  .  Omega-3 1000 MG CAPS, Take 2 g by mouth., Disp: , Rfl:  .  pramipexole (MIRAPEX) 0.5 MG tablet, Take 0.5 mg by mouth., Disp: , Rfl:  .  pregabalin (LYRICA) 100 MG capsule, Take 100 mg by mouth., Disp: , Rfl:   .  ranitidine (ZANTAC) 150 MG capsule, Take 150 mg by mouth., Disp: , Rfl:  .  rosuvastatin (CRESTOR) 20 MG tablet, Take 20 mg by mouth., Disp: , Rfl:  .  tiotropium (SPIRIVA) 18 MCG inhalation capsule, Place 18 mcg into inhaler and inhale., Disp: , Rfl:  .  VITAMIN B COMPLEX-C CAPS, Take 1 tablet by mouth., Disp: , Rfl:  .  vitamin E 400 UNIT capsule, Take 400 Units by mouth., Disp: , Rfl:   Past Medical History: Past Medical History  Diagnosis Date  . Asthma   . Rheumatoid arthritis ([place])   . Hyperlipidemia   . Hypertension   . Sleep apnea   . Myocardial infarction ([place])   . Diabetes ([place])     Tobacco Use: History  Smoking status  . Former Smoker -- 1.00 packs/day for 57 years  . Types: Cigarettes  . Quit date: 02/10/2015  Smokeless tobacco  . Former Geophysical data processor: Recent Review Flowsheet Data    There is no flowsheet data to display.         POCT Glucose      02/26/15 1130 03/05/15 1130 03/05/15 1531 03/05/15 1535     POCT Blood Glucose   Pre-Exercise 224 mg/dL  Breakfast one hour ago       Post-Exercise 193 mg/dL       Pre-Exercise #  2   268 mg/dL     Post-Exercise #2   233 mg/dL --  02/28/2015    Pre-Exercise #3  248 mg/dL      Post-Exercise #3  190 mg/dL         ADL UCSD:     ADL UCSD      02/18/15 1340       ADL UCSD   ADL Phase Entry     SOB Score total 65     Rest 1     Walk 2     Stairs 4     Bath 2     Dress 2     Shop 4         Pulmonary Function Assessment:     Pulmonary Function Assessment - 02/18/15 1340    Initial Spirometry Results   FVC% 62 %   FEV1% 43 %   FEV1/FVC Ratio 55.33   Post Bronchodilator Spirometry Results   FVC% 54 %   FEV1% 62 %   FEV1/FVC Ratio 71.51   Breath   Bilateral Breath Sounds Clear;Decreased   Shortness of Breath Yes;Limiting activity;Fear of Shortness of Breath      Exercise Target Goals:    Exercise Program Goal: Individual exercise prescription set with THRR, safety & activity  barriers. Participant demonstrates ability to understand and report RPE using BORG scale, to self-measure pulse accurately, and to acknowledge the importance of the exercise prescription.  Exercise Prescription Goal: Starting with aerobic activity 30 plus minutes a day, 3 days per week for initial exercise prescription. Provide home exercise prescription and guidelines that participant acknowledges understanding prior to discharge.  Activity Barriers & Risk Stratification:     Activity Barriers & Cardiac Risk Stratification - 02/18/15 1340    Activity Barriers & Cardiac Risk Stratification   Activity Barriers Assistive Device;Deconditioning;Muscular Weakness;Balance Concerns   Cardiac Risk Stratification Moderate      6 Minute Walk:     6 Minute Walk      02/18/15 1817       6 Minute Walk   Phase Initial     Distance 802 feet     Walk Time 6 minutes     RPE 14     Perceived Dyspnea  3     Symptoms No     Resting HR 88 bpm     Resting BP 136/72 mmHg     Max Ex. HR 86 bpm     Max Ex. BP 146/72 mmHg        Initial Exercise Prescription:     Initial Exercise Prescription - 02/18/15 1800    Date of Initial Exercise Prescription   Date 02/18/15   Treadmill   MPH 1   Grade 0   Minutes 10   Recumbant Bike   Level 2   RPM 40   Watts 20   Minutes 10   NuStep   Level 2   Watts 40   Minutes 10   Arm Ergometer   Level 1   Watts 10   Minutes 10   Recumbant Elliptical   Level 2   RPM 40   Watts 20   Minutes 10   REL-XR   Level 2   Watts 40   Minutes 10   Biostep-RELP   Level 2   Watts 40   Minutes 10   Prescription Details   Frequency (times per week) 3   Duration Progress to 30 minutes of continuous aerobic without signs/symptoms of  physical distress   Intensity   THRR REST +  30   Ratings of Perceived Exertion 11-15   Perceived Dyspnea 2-4   Progression Continue progressive overload as per policy without signs/symptoms or physical distress.    Resistance Training   Training Prescription Yes   Weight 2   Reps 10-15      Exercise Prescription Changes:     Exercise Prescription Changes      02/26/15 1500 02/28/15 1100 03/05/15 1200 03/10/15 1600 03/14/15 1100   Exercise Review   Progression  Yes Yes Yes Yes   Response to Exercise   Blood Pressure (Admit) 130/78 mmHg   140/70 mmHg    Blood Pressure (Exercise) 158/82 mmHg   168/88 mmHg    Blood Pressure (Exit) 120/70 mmHg   136/76 mmHg    Heart Rate (Admit) 74 bpm   85 bpm    Heart Rate (Exercise) 100 bpm   115 bpm    Heart Rate (Exit) 86 bpm   89 bpm    Oxygen Saturation (Admit) 97 %   96 %    Oxygen Saturation (Exercise) 96 %   97 %    Oxygen Saturation (Exit) 92 %   94 %    Rating of Perceived Exertion (Exercise) 12   12    Perceived Dyspnea (Exercise) 3   4    Symptoms  None None none None   Comments  Reviewed individualized exercise prescription and made increases per departmental policy. Exercise increases were discussed with the patient and they were able to perform the new work loads without issue (no signs or symptoms).  Obdulio is progressing well in the program. He is gaining strength and stamin and this is reflected in higher workloads and longer time.   Reviewed individualized exercise prescription and made increases per departmental policy. Exercise increases were discussed with the patient and they were able to perform the new work loads without issue (no signs or symptoms).    Duration  Progress to 30 minutes of continuous aerobic without signs/symptoms of physical distress Progress to 30 minutes of continuous aerobic without signs/symptoms of physical distress Progress to 50 minutes of aerobic without signs/symptoms of physical distress Progress to 50 minutes of aerobic without signs/symptoms of physical distress   Intensity  Rest + 30 Rest + 30 THRR unchanged THRR unchanged   Progression  Continue progressive overload as per policy without signs/symptoms or  physical distress. Continue progressive overload as per policy without signs/symptoms or physical distress. Continue progressive overload as per policy without signs/symptoms or physical distress. Continue progressive overload as per policy without signs/symptoms or physical distress.   Resistance Training   Training Prescription Yes Yes Yes Yes Yes   Weight 2 2 2 3 3    Reps 10-12 10-12 10-12 10-12 10-12   Treadmill   MPH 1 1 1.5 1.8 2.1   Grade 0 0 0 0 0   Minutes 10 10 15 15 15    NuStep   Level 2 2 2 3 5    Watts 40 40 40 40 60   Minutes 10 15 15 15 15    REL-XR   Level 2 2 3 3 6    Watts 40 40 55 60 70   Minutes 10 10 15 15 15      03/19/15 1500 03/21/15 1100 03/24/15 1200 03/28/15 1200     Exercise Review   Progression Yes Yes Yes Yes    Response to Exercise   Blood Pressure (Admit)  164/86 mmHg    Blood Pressure (Exercise)    136/78 mmHg    Blood Pressure (Exit)    128/68 mmHg    Heart Rate (Admit)    86 bpm    Heart Rate (Exercise)    132 bpm    Heart Rate (Exit)    113 bpm    Oxygen Saturation (Admit)    97 %    Oxygen Saturation (Exercise)    94 %    Oxygen Saturation (Exit)    94 %    Rating of Perceived Exertion (Exercise)    12    Perceived Dyspnea (Exercise)    4    Symptoms  None None None    Comments  Kaemon can exercise for full class time, therefore efforts of progression will be achieved through interval training and continual challenges through higher workloads Locke can exercise for full class time, therefore efforts of progression will be achieved through interval training and continual challenges through higher workloads Trayven is progressing well in the program. He makes changes almost daily to his exercise in order to continuously challenge himself. He has shown improvement in strength, stamina and improvement in ADLs. He is vey motivated and compliant to the program.     Duration  Progress to 30 minutes of continuous aerobic without signs/symptoms of physical  distress Progress to 30 minutes of continuous aerobic without signs/symptoms of physical distress Progress to 30 minutes of continuous aerobic without signs/symptoms of physical distress    Intensity  THRR unchanged THRR unchanged THRR unchanged    Progression  Continue progressive overload as per policy without signs/symptoms or physical distress. Continue progressive overload as per policy without signs/symptoms or physical distress. Continue progressive overload as per policy without signs/symptoms or physical distress.    Resistance Training   Training Prescription Yes Yes Yes Yes    Weight 4 4 4 4     Reps 10-12 10-12 10-12 10-12    Interval Training   Interval Training  Yes Yes Yes    Equipment  REL-XR REL-XR REL-XR    Comments  L3/60W for 3 min followed by L7/0W for 1 minute, repeat for 15 minutes continuously L3/60W for 3 min followed by L7/0W for 1 minute, repeat for 15 minutes continuously L3/60W for 3 min followed by L7/0W for 1 minute, repeat for 15 minutes continuously    Treadmill   MPH 2.1 2.1 2.1 2.6    Grade 0 0 1 1    Minutes 15 15 15 15     NuStep   Level 5 5 5 8     Watts 60 60 60 70    Minutes 15 15 15 15     REL-XR   Level 6 7 7 8     Watts 70 80 80 100    Minutes 15 15 15 15        Discharge Exercise Prescription (Final Exercise Prescription Changes):     Exercise Prescription Changes - 03/28/15 1200    Exercise Review   Progression Yes   Response to Exercise   Blood Pressure (Admit) 164/86 mmHg   Blood Pressure (Exercise) 136/78 mmHg   Blood Pressure (Exit) 128/68 mmHg   Heart Rate (Admit) 86 bpm   Heart Rate (Exercise) 132 bpm   Heart Rate (Exit) 113 bpm   Oxygen Saturation (Admit) 97 %   Oxygen Saturation (Exercise) 94 %   Oxygen Saturation (Exit) 94 %   Rating of Perceived Exertion (Exercise) 12   Perceived Dyspnea (Exercise) 4  Symptoms None   Comments Donell is progressing well in the program. He makes changes almost daily to his exercise in order  to continuously challenge himself. He has shown improvement in strength, stamina and improvement in ADLs. He is vey motivated and compliant to the program.    Duration Progress to 30 minutes of continuous aerobic without signs/symptoms of physical distress   Intensity THRR unchanged   Progression Continue progressive overload as per policy without signs/symptoms or physical distress.   Resistance Training   Training Prescription Yes   Weight 4   Reps 10-12   Interval Training   Interval Training Yes   Equipment REL-XR   Comments L3/60W for 3 min followed by L7/0W for 1 minute, repeat for 15 minutes continuously   Treadmill   MPH 2.6   Grade 1   Minutes 15   NuStep   Level 8   Watts 70   Minutes 15   REL-XR   Level 8   Watts 100   Minutes 15       Nutrition:  Target Goals: Understanding of nutrition guidelines, daily intake of sodium <152m, cholesterol <2048m calories 30% from fat and 7% or less from saturated fats, daily to have 5 or more servings of fruits and vegetables.  Biometrics:     Pre Biometrics - 02/18/15 1819    Pre Biometrics   Height 5' 9"  (1.753 m)   Weight 261 lb 4.8 oz (118.525 kg)   Waist Circumference 48 inches   Hip Circumference 53.5 inches   Waist to Hip Ratio 0.9 %   BMI (Calculated) 38.7       Nutrition Therapy Plan and Nutrition Goals:     Nutrition Therapy & Goals - 02/18/15 1340    Nutrition Therapy   Diet Mr TuLeveques interested in meeting the dietitian. His wife does all the cooking - they like vegtables and salads. He does not eat much sweets, but he does not watch his portion sizes.      Nutrition Discharge: Rate Your Plate Scores:   Psychosocial: Target Goals: Acknowledge presence or absence of depression, maximize coping skills, provide positive support system. Participant is able to verbalize types and ability to use techniques and skills needed for reducing stress and depression.  Initial Review & Psychosocial  Screening:     Initial Psych Review & Screening - 02/18/15 [place]   Comments Mr TuGorniakas good family support. He states he misses the activity he use to do before the COPD, but he is not depressed.   Barriers   Psychosocial barriers to participate in program There are no identifiable barriers or psychosocial needs.;The patient should benefit from training in stress management and relaxation.   Screening Interventions   Interventions Encouraged to exercise      Quality of Life Scores:     Quality of Life - 02/18/15 1340    Quality of Life Scores   Health/Function Pre 13.13 %   Socioeconomic Pre 28.44 %   Psych/Spiritual Pre 30 %   Family Pre 25.2 %   GLOBAL Pre 21.49 %      PHQ-9:     Recent Review Flowsheet Data    [facility]   Decreased Interest 0   Down, Depressed, Hopeless 0   PHQ - 2 Score 0      Psychosocial Evaluation and Intervention:   Psychosocial Re-Evaluation:     Psychosocial Re-Evaluation  03/28/15 1254           Psychosocial Re-Evaluation   Interventions Encouraged to attend Cardiac Rehabilitation for the exercise       Comments Shayan said sometimes it is hard to accept that his blood pressure and blood sugars and breathing won't get any better due to [place] "but it is just something I need to accept.          Education: Education Goals: Education classes will be provided on a weekly basis, covering required topics. Participant will state understanding/return demonstration of topics presented.  Learning Barriers/Preferences:     Learning Barriers/Preferences - 02/18/15 1340    Learning Barriers/Preferences   Learning Barriers None   Learning Preferences Group Instruction;Individual Instruction;Pictoral;Skilled Demonstration;Written Material;Verbal Instruction;Video      Education Topics: Initial Evaluation Education: - Verbal, written and  demonstration of respiratory meds, RPE/PD scales, oximetry and breathing techniques. Instruction on use of nebulizers and MDIs: cleaning and proper use, rinsing mouth with steroid doses and importance of monitoring MDI activations.          Pulmonary Rehab from 02/18/2015 in [place]   Date  02/18/15   Educator  LB   Instruction Review Code  2- meets goals/outcomes      General Nutrition Guidelines/Fats and Fiber: -Group instruction provided by verbal, written material, models and posters to present the general guidelines for heart healthy nutrition. Gives an explanation and review of dietary fats and fiber.      Pulmonary Rehab from 03/31/2015 in [facility] Cardiac and Pulmonary Rehab   Date  03/31/15   Educator  C. Lenny Pastel, RD   Instruction Review Code  2- meets goals/outcomes      Controlling Sodium/Reading Food Labels: -Group verbal and written material supporting the discussion of sodium use in heart healthy nutrition. Review and explanation with models, verbal and written materials for utilization of the food label.   Exercise Physiology & Risk Factors: - Group verbal and written instruction with models to review the exercise physiology of the cardiovascular system and associated critical values. Details cardiovascular disease risk factors and the goals associated with each risk factor.   Aerobic Exercise & Resistance Training: - Gives group verbal and written discussion on the health impact of inactivity. On the components of aerobic and resistive training programs and the benefits of this training and how to safely progress through these programs.      Pulmonary Rehab from 03/31/2015 in [facility] Cardiac and Pulmonary Rehab   Date  03/19/15   Educator  SWay   Instruction Review Code  2- meets goals/outcomes      Flexibility, Balance, General Exercise Guidelines: - Provides group verbal and written instruction on the benefits of flexibility and  balance training programs. Provides general exercise guidelines with specific guidelines to those with heart or lung disease. Demonstration and skill practice provided.   Stress Management: - Provides group verbal and written instruction about the health risks of elevated stress, cause of high stress, and healthy ways to reduce stress.   Depression: - Provides group verbal and written instruction on the correlation between heart/lung disease and depressed mood, treatment options, and the stigmas associated with seeking treatment.      Pulmonary Rehab from 03/31/2015 in [facility] Cardiac and Pulmonary Rehab   Date  03/05/15   Educator  Berle Mull   Instruction Review Code  2- meets goals/outcomes      Exercise & Equipment Safety: - Individual verbal instruction  and demonstration of equipment use and safety with use of the equipment.      Pulmonary Rehab from 03/31/2015 in [facility] Cardiac and Pulmonary Rehab   Date  02/26/15   Educator  C. Kam Kushnir,RN   Instruction Review Code  1- partially meets, needs review/practice      Infection Prevention: - Provides verbal and written material to individual with discussion of infection control including proper hand washing and proper equipment cleaning during exercise session.      Pulmonary Rehab from 03/31/2015 in [facility] Cardiac and Pulmonary Rehab   Date  02/26/15   Educator  C. Eagar   Instruction Review Code  1- partially meets, needs review/practice      Falls Prevention: - Provides verbal and written material to individual with discussion of falls prevention and safety.      Pulmonary Rehab from 03/31/2015 in [facility] Cardiac and Pulmonary Rehab   Date  02/26/15   Educator  C. Carney Saxton,RN   Instruction Review Code  1- partially meets, needs review/practice      Diabetes: - Individual verbal and written instruction to review signs/symptoms of diabetes, desired ranges of glucose level fasting, after meals and with exercise. Advice that pre and  post exercise glucose checks will be done for 3 sessions at entry of program.      Pulmonary Rehab from 03/31/2015 in [facility] Cardiac and Pulmonary Rehab   Date  02/26/15   Educator  C. EnterkinRN [employer] said he did not check his blood sugar before [place]   Instruction Review Code  1- partially meets, needs review/practice [Robert reports that he has had diabetes for about 20 years. ]      Chronic Lung Diseases: - Group verbal and written instruction to review new updates, new respiratory medications, new advancements in procedures and treatments. Provide informative websites and "800" numbers of self-education.   Lung Procedures: - Group verbal and written instruction to describe testing methods done to diagnose lung disease. Review the outcome of test results. Describe the treatment choices: Pulmonary Function Tests, ABGs and oximetry.   Energy Conservation: - Provide group verbal and written instruction for methods to conserve energy, plan and organize activities. Instruct on pacing techniques, use of adaptive equipment and posture/positioning to relieve shortness of breath.   Triggers: - Group verbal and written instruction to review types of environmental controls: home humidity, furnaces, filters, dust mite/pet prevention, HEPA vacuums. To discuss weather changes, air quality and the benefits of nasal washing.      Pulmonary Rehab from 03/31/2015 in [facility] Cardiac and Pulmonary Rehab   Date  03/10/15   Educator  LB   Instruction Review Code  2- meets goals/outcomes      Exacerbations: - Group verbal and written instruction to provide: warning signs, infection symptoms, calling MD promptly, preventive modes, and value of vaccinations. Review: effective airway clearance, coughing and/or vibration techniques. Create an Sports administrator.   Oxygen: - Individual and group verbal and written instruction on oxygen therapy. Includes supplement oxygen, available portable oxygen systems, continuous  and intermittent flow rates, oxygen safety, concentrators, and Medicare reimbursement for oxygen.   Respiratory Medications: - Group verbal and written instruction to review medications for lung disease. Drug class, frequency, complications, importance of spacers, rinsing mouth after steroid MDI's, and proper cleaning methods for nebulizers.      Pulmonary Rehab from 02/18/2015 in [place]   Date  02/18/15   Educator  LB   Instruction Review Code  2-  meets goals/outcomes      AED/CPR: - Group verbal and written instruction with the use of models to demonstrate the basic use of the AED with the basic ABC's of resuscitation.   Breathing Retraining: - Provides individuals verbal and written instruction on purpose, frequency, and proper technique of diaphragmatic breathing and pursed-lipped breathing. Applies individual practice skills.      Pulmonary Rehab from 03/31/2015 in [facility] Cardiac and Pulmonary Rehab   Date  02/26/15   Educator  C. EnterkinRN   Instruction Review Code  1- partially meets, needs review/practice [Graylen said would start coughing alot since his R nare conges]      Anatomy and Physiology of the Lungs: - Group verbal and written instruction with the use of models to provide basic lung anatomy and physiology related to function, structure and complications of lung disease.   Heart Failure: - Group verbal and written instruction on the basics of heart failure: signs/symptoms, treatments, explanation of ejection fraction, enlarged heart and cardiomyopathy.      Pulmonary Rehab from 03/31/2015 in [facility] Cardiac and Pulmonary Rehab   Date  03/14/15   Educator  [place]   Instruction Review Code  2- meets goals/outcomes      Sleep Apnea: - Individual verbal and written instruction to review Obstructive Sleep Apnea. Review of risk factors, methods for diagnosing and types of masks and machines for OSA.      Pulmonary Rehab from 02/18/2015 in  [place]   Date  02/18/15   Educator  LB   Instruction Review Code  2- meets goals/outcomes      Anxiety: - Provides group, verbal and written instruction on the correlation between heart/lung disease and anxiety, treatment options, and management of anxiety.   Relaxation: - Provides group, verbal and written instruction about the benefits of relaxation for patients with heart/lung disease. Also provides patients with examples of relaxation techniques.   Knowledge Questionnaire Score:     Knowledge Questionnaire Score - 02/18/15 1340    Knowledge Questionnaire Score   Pre Score -2      Personal Goals and Risk Factors at Admission:     Personal Goals and Risk Factors at Admission - 02/18/15 1340    Personal Goals and Risk Factors on Admission    Weight Management Yes   Intervention Learn and follow the exercise and diet guidelines while in the program. Utilize the nutrition and education classes to help gain knowledge of the diet and exercise expectations in the program  Mr Morey is interested in meeting the dietitian. His wife does all the cooking - they like vegtables and salads. He does not eat much sweets, but he does not watch his portion sizes.   Admit Weight 260 lb (117.935 kg)   Goal Weight 200 lb (90.719 kg)   Increase Aerobic Exercise and Physical Activity Yes   Intervention While in program, learn and follow the exercise prescription taught. Start at a low level workload and increase workload after able to maintain previous level for 30 minutes. Increase time before increasing intensity.  Mr Pigeon wants to improve his exercise capacity. He likes to do yardwork and wood working.   Quit Smoking Yes   Number of packs per day 2ppd for 66yr; he quit a week ago - can not use chantix and uses no other nicotine aid.   Intervention Utilize your health care professional team to help with smoking cessation while in the program. Your  doctor can prescribe medications  to aid in cessation. The program can provide information and counseling as needed.   Understand more about Heart/Pulmonary Disease. Yes  Mr Nocera would like to learn more about COPD and the daily management of the disease.   Intervention While in program utilize professionals for any questions, and attend the education sessions. Great websites to use are www.americanheart.org or www.lung.org for reliable information.  Reviewed Mr Goedde's MDIs: Albuterol, Spiriva Respimat, and Symbicort. Instructed on aerochamber and gave him a new chamber.   Improve shortness of breath with ADL's Yes   Intervention While in program, learn and follow the exercise prescription taught. Start at a low level workload and increase workload ad advised by the exercise physiologist. Increase time before increasing intensity.  Mr Skelley would like to have a better understanding of his shortness of breath and less fear with it.    Develop more efficient breathing techniques such as purse lipped breathing and diaphragmatic breathing; and practicing self-pacing with activity Yes   Intervention While in program, learn and utilize the specific breathing techniques taught to you. Continue to practice and use the techniques as needed.  Instructed on PLB and it's benefits.   Increase knowledge of respiratory medications and ability to use respiratory devices properly.  Yes   Intervention While in program, learn to administer MDI, nebulizer, and spacer properly.;Learn to take respiratory medicine as ordered.;While in program, learn to Clean MDI, nebulizers, and spacers properly.  Reviewed MDI's and gave Mr Spagnolo a spacer.   Diabetes Yes   Goal Blood glucose control identified by blood glucose values, HgbA1C. Participant verbalizes understanding of the signs/symptoms of hyper/hypo glycemia, proper foot care and importance of medication and nutrition plan for blood glucose control.   Intervention  Provide nutrition & aerobic exercise along with prescribed medications to achieve blood glucose in normal ranges: Fasting 65-99 mg/dL   Hypertension Yes   Goal Participant will see blood pressure controlled within the values of 140/69m/Hg or within value directed by their physician.   Intervention Provide nutrition & aerobic exercise along with prescribed medications to achieve BP 140/90 or less.      Personal Goals and Risk Factors Review:      Goals and Risk Factor Review      02/26/15 1130 02/28/15 1301 03/05/15 1130 03/10/15 1130 03/10/15 1405   Weight Management   Goals Progress/Improvement seen  (p) Yes   Yes   Comments  (p) Has gone from 50 in pants to 48 inche pants.    Continues to stay tobacco free.  States is the hardest thing he has ever done and he is determined to stay quit.   Increase Aerobic Exercise and Physical Activity   Goals Progress/Improvement seen     Yes    Comments    Mr TGwaltneystates he feels stronger when he is walking. He was motivated today on the treadmill to see the 1 mile marker!    Quit Smoking   Goals Progress/Improvement seen Yes  Yes     Comments Mr TChohas not had a cigarrette since his quit date which is now about 2 weeks. He does not use any nicotine replacement.  Mr TGeorgis smoke free - no cigarrettes since his quit date.     Understand more about Heart/Pulmonary Disease   Goals Progress/Improvement seen     Yes    Comments    Mr TYergerattended the Trigger lecture today which had good tips on staying healthy with COPD by managing triggers in  the home and outdoors with pollen and air pollution.    Improve shortness of breath with ADL's   Comments    Mr Baudoin has completed 2 weeks of exercise and has not noticed an improvement in his shortness of breath. As he progresses with his exercise, I am hoping he will have some change to his breathing.     Breathing Techniques   Comments    PLB is a hard technique for Mr Gheen. I recommended he take a  breath in through his mouth instead of his nose and then blow out of his mouth with pursed lips.    Increase knowledge of respiratory medications   Goals Progress/Improvement seen    Yes     Comments   Mr Geister carries his Poventil inhaler with him and did use it before [name] and after the treadmill. His exercise goal did increase to 28mns and 1.542m.     Diabetes   Goal  (p) Blood glucose control identified by blood glucose values, HgbA1C. Participant verbalizes understanding of the signs/symptoms of hyper/hypo glycemia, proper foot care and importance of medication and nutrition plan for blood glucose control.        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  LeSumeetas been making impressive improvments on all the machines during his exercise time. He can exercise continuously for the entire 55 minutes allotted for aerobic exercise. For the rest of his time in the program we will continue to manipulate his intensity in order to keep him moving forward with progression.     Quit Smoking   Goals Progress/Improvement seen Yes       Comments No cigarrettes since his quit date.       Improve shortness of breath with ADL's   Goals Progress/Improvement seen   Yes      Comments  Feels like his SOB is steadily improving, however, no large gains noted.      Breathing Techniques   Goals Progress/Improvement seen   Yes      Comments  LeSamuleas been attempting to use correct  breathing techniques, however, does forget sometimes and notices when SOB increases.  Is trying to work on this daily.      Diabetes   Progress seen towards goals   Yes     Comments   LeKumarreports his blood sugar 3 days ago 3 hours after a meal was good at 138.      Hypertension    Progress seen toward goals   Yes     Comments   LeJepthaeports the MD recently increased his or changed his medicine for his blood  pressure. Systolic in 14373'Spon arrival.         Personal Goals Discharge (Final Personal Goals and Risk Factors Review):      Goals and Risk Factor Review - 04/01/15 0651    Increase Aerobic Exercise and Physical Activity   Comments LeLamarias been making impressive improvments on all the machines during his exercise time. He can exercise continuously for the entire 55 minutes allotted for aerobic exercise. For the rest of his time in the program we will continue to manipulate his intensity in order to keep  him moving forward with progression.       ITP Comments:     ITP Comments      02/26/15 1130 02/28/15 1149 03/07/15 1241 03/10/15 1301 03/14/15 1141   ITP Comments Chronic pain level "6"; unchanged with exercise Personal and exercise goals expected to be met in 33 more sessions. Progress on specific individualized goals will be charted in patient's ITP. Upon completion of the program the patient will be comfortable managing exercise goals and progression on their own.  Personal and exercise goals expected to be met in 31 more sessions. Progress on specific individualized goals will be charted in patient's ITP. Upon completion of the program the patient will be comfortable managing exercise goals and progression on their own.  Personal and Exercsie goals expeted to be met in 30   more sessions. Progress on specific goals will be charted in the ITP.  Personal and exercise goals expected to be met in 29 more sessions. Progress on specific individualized goals will be charted in patient's ITP. Upon completion of the program the patient will be comfortable managing exercise goals and progression on their own.      03/21/15 1150 03/21/15 1320 03/24/15 1219 03/26/15 1254 03/28/15 1230   ITP Comments Personal and exercise goals expected to be met in 26 more sessions.  Progress on specific individualized goals will be charted in patient's ITP. Upon completion of the program the patient will be comfortable managing exercise goals and progression on their own.  Mr. Lazenby has a cut on his left pointer finger from a knife.  It is swollen and red, but has no puss.  He is going to the [place] today to have it looked at. Mr. Vereen went to the [place]. He reported they gave him a tetanus shot and cleaned it out. He also reported they gave him 10 days of antibiotics. We discussed for him to possibly eat some yougart while he is taking antibiotics. Semaj should achieve his goals by 25 more sessions.  Rolan should complete his goals including exercise goals by session 36 and he is at session 13 today.  Personal and exercise goals expected to be met in 22 more sessions. Progress on specific individualized goals will be charted in patient's ITP. Upon completion of the program the patient will be comfortable managing exercise goals and progression on their own.      03/28/15 1246 03/28/15 1255 03/31/15 1214 03/31/15 1246 04/02/15 1308   ITP Comments Simeon Craft states he checks his blood sugars every few days and it was 138 three hours after a meal. Damain reports they recently increased his blood pressure medicine but his systolic is still in the 027'X and he has an appt in the future to follow up with his MD about that. Hridaan said his weight is staying about the same since he knows he likes his meat. He said he has already met with 2 registered [facility] dieticians.  Vu said sometimes it is hard to accept that his blood pressure and blood sugars and breathing won't get any better due to [place] [place] "but it is just something I need to accept.  Brooke's finger infection is doing better. Yousof should complete his exercise goals by visit 36 or by the 36th session.  Dragon left early since he said he fellt terrible after being up all night coughing. Dyer reports he just finished 10 days of antibiotics  for his finger. I advised him to contact his MD. Simeon Craft did hear  education by our Registered Dietician. He did 5 minute warm up on Recumbent Elliptical on Level 3 and only a couple of minutes of level 7 then I encouraged him to leave and check with his MD.  Eveline Keto left a vm on the Pulmonary Rehab vm that he is still sick and won't be in to exercise today.       Comments: Toni Hoffmeister left a vm on the Pulmonary Rehab vm that he is still sick and won't be in to exercise today.

## 2015-04-11 ENCOUNTER — Encounter: Payer: Non-veteran care | Admitting: *Deleted

## 2015-04-11 DIAGNOSIS — J449 Chronic obstructive pulmonary disease, unspecified: Secondary | ICD-10-CM

## 2015-04-11 NOTE — Progress Notes (Signed)
Daily Session Note  Patient Details  Name: Jeffrey Burgess MRN: [id number] Date of Birth: 1945-10-02 Referring Provider:  Delorise Shiner, MD  Encounter Date: 04/11/2015  Check In:     Session Check In - 04/11/15 1211    Check-In   Location [facility]   Staff Present Gerlene Burdock, RN, BSN;Stacey Blanch Media, RRT, RCP, Respiratory Griffin Basil, MS, ACSM CEP, Exercise Physiologist   Supervising physician immediately available to respond to emergencies [name] immediately available ER MD   Physician(s) Dr. Owens Shark and DR. Lord   Medication changes reported     Yes   Comments Steroid was increased bty MD from 10 to Pablo reports.   Fall or balance concerns reported    No   Warm-up and Cool-down Performed on first and last piece of equipment   Resistance Training Performed Yes   VAD Patient? No   Pain Assessment   Currently in Pain? No/denies         Goals Met:  Proper associated with RPD/PD & O2 Sat Exercise tolerated well  Goals Unmet:  Not Applicable  Goals Comments:    Dr. Emily Filbert is Medical Director for [place] and [name].

## 2015-04-11 NOTE — Progress Notes (Signed)
Pulmonary Individual Treatment Plan  Patient Details  Name: Jeffrey Burgess MRN: [id number] Date of Birth: 02-07-1946 Referring Provider:  Delorise Shiner, MD  Initial Encounter Date:    Visit Diagnosis: Chronic obstructive pulmonary disease, unspecified COPD type ([place])  Patient's Home Medications on Admission:  Current outpatient prescriptions:  .  acetaminophen (TYLENOL) 500 MG tablet, Take 500 mg by mouth., Disp: , Rfl:  .  albuterol (PROAIR HFA) 108 (90 Base) MCG/ACT inhaler, Inhale 2 puffs into the lungs., Disp: , Rfl:  .  aspirin EC 81 MG tablet, Take 81 mg by mouth., Disp: , Rfl:  .  beclomethasone (BECONASE-AQ) 42 MCG/SPRAY nasal spray, Place 2 sprays into the nose., Disp: , Rfl:  .  budesonide-formoterol (SYMBICORT) 160-4.5 MCG/ACT inhaler, Inhale 2 puffs into the lungs., Disp: , Rfl:  .  buPROPion (WELLBUTRIN) 100 MG tablet, Take 100 mg by mouth., Disp: , Rfl:  .  busPIRone (BUSPAR) 10 MG tablet, Take 10 mg by mouth., Disp: , Rfl:  .  Cholecalciferol (VITAMIN D3 SUPER STRENGTH) 2000 units TABS, Take 2,000 Units by mouth., Disp: , Rfl:  .  docusate sodium (STOOL SOFTENER) 100 MG capsule, Take 100 mg by mouth., Disp: , Rfl:  .  fluticasone (FLONASE) 50 MCG/ACT nasal spray, Place 1 spray into the nose., Disp: , Rfl:  .  insulin aspart protamine - aspart (NOVOLOG 70/30 MIX) (70-30) 100 UNIT/ML FlexPen, Inject 18 Units into the skin., Disp: , Rfl:  .  metFORMIN (GLUCOPHAGE) 500 MG tablet, Take 500 mg by mouth., Disp: , Rfl:  .  metoprolol tartrate (LOPRESSOR) 25 MG tablet, Take 25 mg by mouth., Disp: , Rfl:  .  montelukast (SINGULAIR) 10 MG tablet, Take 10 mg by mouth., Disp: , Rfl:  .  nitroGLYCERIN (NITROSTAT) 0.4 MG SL tablet, Place 0.4 mg under the tongue., Disp: , Rfl:  .  Omega-3 1000 MG CAPS, Take 2 g by mouth., Disp: , Rfl:  .  pramipexole (MIRAPEX) 0.5 MG tablet, Take 0.5 mg by mouth., Disp: , Rfl:  .  pregabalin (LYRICA) 100 MG capsule, Take 100 mg by mouth., Disp: , Rfl:   .  ranitidine (ZANTAC) 150 MG capsule, Take 150 mg by mouth., Disp: , Rfl:  .  rosuvastatin (CRESTOR) 20 MG tablet, Take 20 mg by mouth., Disp: , Rfl:  .  tiotropium (SPIRIVA) 18 MCG inhalation capsule, Place 18 mcg into inhaler and inhale., Disp: , Rfl:  .  VITAMIN B COMPLEX-C CAPS, Take 1 tablet by mouth., Disp: , Rfl:  .  vitamin E 400 UNIT capsule, Take 400 Units by mouth., Disp: , Rfl:   Past Medical History: Past Medical History  Diagnosis Date  . Asthma   . Rheumatoid arthritis ([place])   . Hyperlipidemia   . Hypertension   . Sleep apnea   . Myocardial infarction ([place])   . Diabetes ([place])     Tobacco Use: History  Smoking status  . Former Smoker -- 1.00 packs/day for 57 years  . Types: Cigarettes  . Quit date: 02/10/2015  Smokeless tobacco  . Former Geophysical data processor: Recent Review Flowsheet Data    There is no flowsheet data to display.         POCT Glucose      02/26/15 1130 03/05/15 1130 03/05/15 1531 03/05/15 1535     POCT Blood Glucose   Pre-Exercise 224 mg/dL  Breakfast one hour ago       Post-Exercise 193 mg/dL       Pre-Exercise #  2   268 mg/dL     Post-Exercise #2   233 mg/dL --  02/28/2015    Pre-Exercise #3  248 mg/dL      Post-Exercise #3  190 mg/dL         ADL UCSD:     ADL UCSD      02/18/15 1340       ADL UCSD   ADL Phase Entry     SOB Score total 65     Rest 1     Walk 2     Stairs 4     Bath 2     Dress 2     Shop 4         Pulmonary Function Assessment:     Pulmonary Function Assessment - 02/18/15 1340    Initial Spirometry Results   FVC% 62 %   FEV1% 43 %   FEV1/FVC Ratio 55.33   Post Bronchodilator Spirometry Results   FVC% 54 %   FEV1% 62 %   FEV1/FVC Ratio 71.51   Breath   Bilateral Breath Sounds Clear;Decreased   Shortness of Breath Yes;Limiting activity;Fear of Shortness of Breath      Exercise Target Goals:    Exercise Program Goal: Individual exercise prescription set with THRR, safety & activity  barriers. Participant demonstrates ability to understand and report RPE using BORG scale, to self-measure pulse accurately, and to acknowledge the importance of the exercise prescription.  Exercise Prescription Goal: Starting with aerobic activity 30 plus minutes a day, 3 days per week for initial exercise prescription. Provide home exercise prescription and guidelines that participant acknowledges understanding prior to discharge.  Activity Barriers & Risk Stratification:     Activity Barriers & Cardiac Risk Stratification - 02/18/15 1340    Activity Barriers & Cardiac Risk Stratification   Activity Barriers Assistive Device;Deconditioning;Muscular Weakness;Balance Concerns   Cardiac Risk Stratification Moderate      6 Minute Walk:     6 Minute Walk      02/18/15 1817       6 Minute Walk   Phase Initial     Distance 802 feet     Walk Time 6 minutes     RPE 14     Perceived Dyspnea  3     Symptoms No     Resting HR 88 bpm     Resting BP 136/72 mmHg     Max Ex. HR 86 bpm     Max Ex. BP 146/72 mmHg        Initial Exercise Prescription:     Initial Exercise Prescription - 02/18/15 1800    Date of Initial Exercise Prescription   Date 02/18/15   Treadmill   MPH 1   Grade 0   Minutes 10   Recumbant Bike   Level 2   RPM 40   Watts 20   Minutes 10   NuStep   Level 2   Watts 40   Minutes 10   Arm Ergometer   Level 1   Watts 10   Minutes 10   Recumbant Elliptical   Level 2   RPM 40   Watts 20   Minutes 10   REL-XR   Level 2   Watts 40   Minutes 10   Biostep-RELP   Level 2   Watts 40   Minutes 10   Prescription Details   Frequency (times per week) 3   Duration Progress to 30 minutes of continuous aerobic without signs/symptoms of  physical distress   Intensity   THRR REST +  30   Ratings of Perceived Exertion 11-15   Perceived Dyspnea 2-4   Progression Continue progressive overload as per policy without signs/symptoms or physical distress.    Resistance Training   Training Prescription Yes   Weight 2   Reps 10-15      Exercise Prescription Changes:     Exercise Prescription Changes      02/26/15 1500 02/28/15 1100 03/05/15 1200 03/10/15 1600 03/14/15 1100   Exercise Review   Progression  Yes Yes Yes Yes   Response to Exercise   Blood Pressure (Admit) 130/78 mmHg   140/70 mmHg    Blood Pressure (Exercise) 158/82 mmHg   168/88 mmHg    Blood Pressure (Exit) 120/70 mmHg   136/76 mmHg    Heart Rate (Admit) 74 bpm   85 bpm    Heart Rate (Exercise) 100 bpm   115 bpm    Heart Rate (Exit) 86 bpm   89 bpm    Oxygen Saturation (Admit) 97 %   96 %    Oxygen Saturation (Exercise) 96 %   97 %    Oxygen Saturation (Exit) 92 %   94 %    Rating of Perceived Exertion (Exercise) 12   12    Perceived Dyspnea (Exercise) 3   4    Symptoms  None None none None   Comments  Reviewed individualized exercise prescription and made increases per departmental policy. Exercise increases were discussed with the patient and they were able to perform the new work loads without issue (no signs or symptoms).  Caison is progressing well in the program. He is gaining strength and stamin and this is reflected in higher workloads and longer time.   Reviewed individualized exercise prescription and made increases per departmental policy. Exercise increases were discussed with the patient and they were able to perform the new work loads without issue (no signs or symptoms).    Duration  Progress to 30 minutes of continuous aerobic without signs/symptoms of physical distress Progress to 30 minutes of continuous aerobic without signs/symptoms of physical distress Progress to 50 minutes of aerobic without signs/symptoms of physical distress Progress to 50 minutes of aerobic without signs/symptoms of physical distress   Intensity  Rest + 30 Rest + 30 THRR unchanged THRR unchanged   Progression  Continue progressive overload as per policy without signs/symptoms or  physical distress. Continue progressive overload as per policy without signs/symptoms or physical distress. Continue progressive overload as per policy without signs/symptoms or physical distress. Continue progressive overload as per policy without signs/symptoms or physical distress.   Resistance Training   Training Prescription Yes Yes Yes Yes Yes   Weight 2 2 2 3 3    Reps 10-12 10-12 10-12 10-12 10-12   Treadmill   MPH 1 1 1.5 1.8 2.1   Grade 0 0 0 0 0   Minutes 10 10 15 15 15    NuStep   Level 2 2 2 3 5    Watts 40 40 40 40 60   Minutes 10 15 15 15 15    REL-XR   Level 2 2 3 3 6    Watts 40 40 55 60 70   Minutes 10 10 15 15 15      03/19/15 1500 03/21/15 1100 03/24/15 1200 03/28/15 1200     Exercise Review   Progression Yes Yes Yes Yes    Response to Exercise   Blood Pressure (Admit)  164/86 mmHg    Blood Pressure (Exercise)    136/78 mmHg    Blood Pressure (Exit)    128/68 mmHg    Heart Rate (Admit)    86 bpm    Heart Rate (Exercise)    132 bpm    Heart Rate (Exit)    113 bpm    Oxygen Saturation (Admit)    97 %    Oxygen Saturation (Exercise)    94 %    Oxygen Saturation (Exit)    94 %    Rating of Perceived Exertion (Exercise)    12    Perceived Dyspnea (Exercise)    4    Symptoms  None None None    Comments  Ronav can exercise for full class time, therefore efforts of progression will be achieved through interval training and continual challenges through higher workloads Nyshawn can exercise for full class time, therefore efforts of progression will be achieved through interval training and continual challenges through higher workloads Zaylan is progressing well in the program. He makes changes almost daily to his exercise in order to continuously challenge himself. He has shown improvement in strength, stamina and improvement in ADLs. He is vey motivated and compliant to the program.     Duration  Progress to 30 minutes of continuous aerobic without signs/symptoms of physical  distress Progress to 30 minutes of continuous aerobic without signs/symptoms of physical distress Progress to 30 minutes of continuous aerobic without signs/symptoms of physical distress    Intensity  THRR unchanged THRR unchanged THRR unchanged    Progression  Continue progressive overload as per policy without signs/symptoms or physical distress. Continue progressive overload as per policy without signs/symptoms or physical distress. Continue progressive overload as per policy without signs/symptoms or physical distress.    Resistance Training   Training Prescription Yes Yes Yes Yes    Weight 4 4 4 4     Reps 10-12 10-12 10-12 10-12    Interval Training   Interval Training  Yes Yes Yes    Equipment  REL-XR REL-XR REL-XR    Comments  L3/60W for 3 min followed by L7/0W for 1 minute, repeat for 15 minutes continuously L3/60W for 3 min followed by L7/0W for 1 minute, repeat for 15 minutes continuously L3/60W for 3 min followed by L7/0W for 1 minute, repeat for 15 minutes continuously    Treadmill   MPH 2.1 2.1 2.1 2.6    Grade 0 0 1 1    Minutes 15 15 15 15     NuStep   Level 5 5 5 8     Watts 60 60 60 70    Minutes 15 15 15 15     REL-XR   Level 6 7 7 8     Watts 70 80 80 100    Minutes 15 15 15 15        Discharge Exercise Prescription (Final Exercise Prescription Changes):     Exercise Prescription Changes - 03/28/15 1200    Exercise Review   Progression Yes   Response to Exercise   Blood Pressure (Admit) 164/86 mmHg   Blood Pressure (Exercise) 136/78 mmHg   Blood Pressure (Exit) 128/68 mmHg   Heart Rate (Admit) 86 bpm   Heart Rate (Exercise) 132 bpm   Heart Rate (Exit) 113 bpm   Oxygen Saturation (Admit) 97 %   Oxygen Saturation (Exercise) 94 %   Oxygen Saturation (Exit) 94 %   Rating of Perceived Exertion (Exercise) 12   Perceived Dyspnea (Exercise) 4  Symptoms None   Comments Hal is progressing well in the program. He makes changes almost daily to his exercise in order  to continuously challenge himself. He has shown improvement in strength, stamina and improvement in ADLs. He is vey motivated and compliant to the program.    Duration Progress to 30 minutes of continuous aerobic without signs/symptoms of physical distress   Intensity THRR unchanged   Progression Continue progressive overload as per policy without signs/symptoms or physical distress.   Resistance Training   Training Prescription Yes   Weight 4   Reps 10-12   Interval Training   Interval Training Yes   Equipment REL-XR   Comments L3/60W for 3 min followed by L7/0W for 1 minute, repeat for 15 minutes continuously   Treadmill   MPH 2.6   Grade 1   Minutes 15   NuStep   Level 8   Watts 70   Minutes 15   REL-XR   Level 8   Watts 100   Minutes 15       Nutrition:  Target Goals: Understanding of nutrition guidelines, daily intake of sodium <1542m, cholesterol <2057m calories 30% from fat and 7% or less from saturated fats, daily to have 5 or more servings of fruits and vegetables.  Biometrics:     Pre Biometrics - 02/18/15 1819    Pre Biometrics   Height 5' 9"  (1.753 m)   Weight 261 lb 4.8 oz (118.525 kg)   Waist Circumference 48 inches   Hip Circumference 53.5 inches   Waist to Hip Ratio 0.9 %   BMI (Calculated) 38.7       Nutrition Therapy Plan and Nutrition Goals:     Nutrition Therapy & Goals - 02/18/15 1340    Nutrition Therapy   Diet Mr TuJoyces interested in meeting the dietitian. His wife does all the cooking - they like vegtables and salads. He does not eat much sweets, but he does not watch his portion sizes.      Nutrition Discharge: Rate Your Plate Scores:   Psychosocial: Target Goals: Acknowledge presence or absence of depression, maximize coping skills, provide positive support system. Participant is able to verbalize types and ability to use techniques and skills needed for reducing stress and depression.  Initial Review & Psychosocial  Screening:     Initial Psych Review & Screening - 02/18/15 [id number]   Comments Mr TuLipseyas good family support. He states he misses the activity he use to do before the COPD, but he is not depressed.   Barriers   Psychosocial barriers to participate in program There are no identifiable barriers or psychosocial needs.;The patient should benefit from training in stress management and relaxation.   Screening Interventions   Interventions Encouraged to exercise      Quality of Life Scores:     Quality of Life - 02/18/15 1340    Quality of Life Scores   Health/Function Pre 13.13 %   Socioeconomic Pre 28.44 %   Psych/Spiritual Pre 30 %   Family Pre 25.2 %   GLOBAL Pre 21.49 %      PHQ-9:     Recent Review Flowsheet Data    [facility]   Decreased Interest 0   Down, Depressed, Hopeless 0   PHQ - 2 Score 0      Psychosocial Evaluation and Intervention:   Psychosocial Re-Evaluation:     Psychosocial Re-Evaluation  03/28/15 1254 04/02/15 1310 04/11/15 1214       Psychosocial Re-Evaluation   Interventions Encouraged to attend Cardiac Rehabilitation for the exercise Encouraged to attend Cardiac Rehabilitation for the exercise      Comments Johnatan said sometimes it is hard to accept that his blood pressure and blood sugars and breathing won't get any better due to [employer] [place] "but it is just something I need to accept.  Eveline Keto left a vm on the Pulmonary Rehab vm that he is still sick and won't be in to exercise today.  Muzamil has had a lot going on the past 10 days and lost 8 lbs not feeling well.        Education: Education Goals: Education classes will be provided on a weekly basis, covering required topics. Participant will state understanding/return demonstration of topics presented.  Learning Barriers/Preferences:     Learning Barriers/Preferences - 02/18/15 1340    Learning  Barriers/Preferences   Learning Barriers None   Learning Preferences Group Instruction;Individual Instruction;Pictoral;Skilled Demonstration;Written Material;Verbal Instruction;Video      Education Topics: Initial Evaluation Education: - Verbal, written and demonstration of respiratory meds, RPE/PD scales, oximetry and breathing techniques. Instruction on use of nebulizers and MDIs: cleaning and proper use, rinsing mouth with steroid doses and importance of monitoring MDI activations.          Pulmonary Rehab from 02/18/2015 in [place]   Date  02/18/15   Educator  LB   Instruction Review Code  2- meets goals/outcomes      General Nutrition Guidelines/Fats and Fiber: -Group instruction provided by verbal, written material, models and posters to present the general guidelines for heart healthy nutrition. Gives an explanation and review of dietary fats and fiber.      Pulmonary Rehab from 03/31/2015 in [facility] Cardiac and Pulmonary Rehab   Date  03/31/15   Educator  C. Lenny Pastel, RD   Instruction Review Code  2- meets goals/outcomes      Controlling Sodium/Reading Food Labels: -Group verbal and written material supporting the discussion of sodium use in heart healthy nutrition. Review and explanation with models, verbal and written materials for utilization of the food label.   Exercise Physiology & Risk Factors: - Group verbal and written instruction with models to review the exercise physiology of the cardiovascular system and associated critical values. Details cardiovascular disease risk factors and the goals associated with each risk factor.   Aerobic Exercise & Resistance Training: - Gives group verbal and written discussion on the health impact of inactivity. On the components of aerobic and resistive training programs and the benefits of this training and how to safely progress through these programs.      Pulmonary Rehab from 03/31/2015 in  [facility] Cardiac and Pulmonary Rehab   Date  03/19/15   Educator  SWay   Instruction Review Code  2- meets goals/outcomes      Flexibility, Balance, General Exercise Guidelines: - Provides group verbal and written instruction on the benefits of flexibility and balance training programs. Provides general exercise guidelines with specific guidelines to those with heart or lung disease. Demonstration and skill practice provided.   Stress Management: - Provides group verbal and written instruction about the health risks of elevated stress, cause of high stress, and healthy ways to reduce stress.   Depression: - Provides group verbal and written instruction on the correlation between heart/lung disease and depressed mood, treatment options, and the stigmas associated with seeking treatment.  Pulmonary Rehab from 03/31/2015 in [facility] Cardiac and Pulmonary Rehab   Date  03/05/15   Educator  Berle Mull   Instruction Review Code  2- meets goals/outcomes      Exercise & Equipment Safety: - Individual verbal instruction and demonstration of equipment use and safety with use of the equipment.      Pulmonary Rehab from 03/31/2015 in [facility] Cardiac and Pulmonary Rehab   Date  02/26/15   Educator  C. Jocelyne Reinertsen,RN   Instruction Review Code  1- partially meets, needs review/practice      Infection Prevention: - Provides verbal and written material to individual with discussion of infection control including proper hand washing and proper equipment cleaning during exercise session.      Pulmonary Rehab from 03/31/2015 in [facility] Cardiac and Pulmonary Rehab   Date  02/26/15   Educator  C. [place]   Instruction Review Code  1- partially meets, needs review/practice      Falls Prevention: - Provides verbal and written material to individual with discussion of falls prevention and safety.      Pulmonary Rehab from 03/31/2015 in [facility] Cardiac and Pulmonary Rehab   Date  02/26/15   Educator  C.  Ikea Demicco,RN   Instruction Review Code  1- partially meets, needs review/practice      Diabetes: - Individual verbal and written instruction to review signs/symptoms of diabetes, desired ranges of glucose level fasting, after meals and with exercise. Advice that pre and post exercise glucose checks will be done for 3 sessions at entry of program.      Pulmonary Rehab from 03/31/2015 in [facility] Cardiac and Pulmonary Rehab   Date  02/26/15   Educator  C. EnterkinRN [employer] said he did not check his blood sugar before [place]   Instruction Review Code  1- partially meets, needs review/practice [Shahil reports that he has had diabetes for about 20 years. ]      Chronic Lung Diseases: - Group verbal and written instruction to review new updates, new respiratory medications, new advancements in procedures and treatments. Provide informative websites and "800" numbers of self-education.   Lung Procedures: - Group verbal and written instruction to describe testing methods done to diagnose lung disease. Review the outcome of test results. Describe the treatment choices: Pulmonary Function Tests, ABGs and oximetry.   Energy Conservation: - Provide group verbal and written instruction for methods to conserve energy, plan and organize activities. Instruct on pacing techniques, use of adaptive equipment and posture/positioning to relieve shortness of breath.   Triggers: - Group verbal and written instruction to review types of environmental controls: home humidity, furnaces, filters, dust mite/pet prevention, HEPA vacuums. To discuss weather changes, air quality and the benefits of nasal washing.      Pulmonary Rehab from 03/31/2015 in [facility] Cardiac and Pulmonary Rehab   Date  03/10/15   Educator  LB   Instruction Review Code  2- meets goals/outcomes      Exacerbations: - Group verbal and written instruction to provide: warning signs, infection symptoms, calling MD promptly, preventive modes, and  value of vaccinations. Review: effective airway clearance, coughing and/or vibration techniques. Create an Sports administrator.   Oxygen: - Individual and group verbal and written instruction on oxygen therapy. Includes supplement oxygen, available portable oxygen systems, continuous and intermittent flow rates, oxygen safety, concentrators, and Medicare reimbursement for oxygen.   Respiratory Medications: - Group verbal and written instruction to review medications for lung disease. Drug class, frequency, complications, importance of spacers, rinsing  mouth after steroid MDI's, and proper cleaning methods for nebulizers.      Pulmonary Rehab from 02/18/2015 in [place]   Date  02/18/15   Educator  LB   Instruction Review Code  2- meets goals/outcomes      AED/CPR: - Group verbal and written instruction with the use of models to demonstrate the basic use of the AED with the basic ABC's of resuscitation.   Breathing Retraining: - Provides individuals verbal and written instruction on purpose, frequency, and proper technique of diaphragmatic breathing and pursed-lipped breathing. Applies individual practice skills.      Pulmonary Rehab from 03/31/2015 in [facility] Cardiac and Pulmonary Rehab   Date  02/26/15   Educator  C. EnterkinRN   Instruction Review Code  1- partially meets, needs review/practice [[employer] said would start coughing alot since his R nare conges]      Anatomy and Physiology of the Lungs: - Group verbal and written instruction with the use of models to provide basic lung anatomy and physiology related to function, structure and complications of lung disease.   Heart Failure: - Group verbal and written instruction on the basics of heart failure: signs/symptoms, treatments, explanation of ejection fraction, enlarged heart and cardiomyopathy.      Pulmonary Rehab from 03/31/2015 in [facility] Cardiac and Pulmonary Rehab   Date  03/14/15   Educator  [employer]    Instruction Review Code  2- meets goals/outcomes      Sleep Apnea: - Individual verbal and written instruction to review Obstructive Sleep Apnea. Review of risk factors, methods for diagnosing and types of masks and machines for OSA.      Pulmonary Rehab from 02/18/2015 in [place]   Date  02/18/15   Educator  LB   Instruction Review Code  2- meets goals/outcomes      Anxiety: - Provides group, verbal and written instruction on the correlation between heart/lung disease and anxiety, treatment options, and management of anxiety.   Relaxation: - Provides group, verbal and written instruction about the benefits of relaxation for patients with heart/lung disease. Also provides patients with examples of relaxation techniques.   Knowledge Questionnaire Score:     Knowledge Questionnaire Score - 02/18/15 1340    Knowledge Questionnaire Score   Pre Score -2      Personal Goals and Risk Factors at Admission:     Personal Goals and Risk Factors at Admission - 02/18/15 1340    Personal Goals and Risk Factors on Admission    Weight Management Yes   Intervention Learn and follow the exercise and diet guidelines while in the program. Utilize the nutrition and education classes to help gain knowledge of the diet and exercise expectations in the program  Mr Papesh is interested in meeting the dietitian. His wife does all the cooking - they like vegtables and salads. He does not eat much sweets, but he does not watch his portion sizes.   Admit Weight 260 lb (117.935 kg)   Goal Weight 200 lb (90.719 kg)   Increase Aerobic Exercise and Physical Activity Yes   Intervention While in program, learn and follow the exercise prescription taught. Start at a low level workload and increase workload after able to maintain previous level for 30 minutes. Increase time before increasing intensity.  Mr Delsol wants to improve his exercise capacity. He likes to do  yardwork and wood working.   Quit Smoking Yes   Number of packs per  day 2ppd for 56yr; he quit a week ago - can not use chantix and uses no other nicotine aid.   Intervention Utilize your health care professional team to help with smoking cessation while in the program. Your doctor can prescribe medications to aid in cessation. The program can provide information and counseling as needed.   Understand more about Heart/Pulmonary Disease. Yes  Mr TProbertwould like to learn more about COPD and the daily management of the disease.   Intervention While in program utilize professionals for any questions, and attend the education sessions. Great websites to use are www.americanheart.org or www.lung.org for reliable information.  Reviewed Mr Rinella's MDIs: Albuterol, Spiriva Respimat, and Symbicort. Instructed on aerochamber and gave him a new chamber.   Improve shortness of breath with ADL's Yes   Intervention While in program, learn and follow the exercise prescription taught. Start at a low level workload and increase workload ad advised by the exercise physiologist. Increase time before increasing intensity.  Mr TGallinawould like to have a better understanding of his shortness of breath and less fear with it.    Develop more efficient breathing techniques such as purse lipped breathing and diaphragmatic breathing; and practicing self-pacing with activity Yes   Intervention While in program, learn and utilize the specific breathing techniques taught to you. Continue to practice and use the techniques as needed.  Instructed on PLB and it's benefits.   Increase knowledge of respiratory medications and ability to use respiratory devices properly.  Yes   Intervention While in program, learn to administer MDI, nebulizer, and spacer properly.;Learn to take respiratory medicine as ordered.;While in program, learn to Clean MDI, nebulizers, and spacers properly.  Reviewed MDI's and gave Mr TDianaa spacer.    Diabetes Yes   Goal Blood glucose control identified by blood glucose values, HgbA1C. Participant verbalizes understanding of the signs/symptoms of hyper/hypo glycemia, proper foot care and importance of medication and nutrition plan for blood glucose control.   Intervention Provide nutrition & aerobic exercise along with prescribed medications to achieve blood glucose in normal ranges: Fasting 65-99 mg/dL   Hypertension Yes   Goal Participant will see blood pressure controlled within the values of 140/934mHg or within value directed by their physician.   Intervention Provide nutrition & aerobic exercise along with prescribed medications to achieve BP 140/90 or less.      Personal Goals and Risk Factors Review:      Goals and Risk Factor Review      02/26/15 1130 02/28/15 1301 03/05/15 1130 03/10/15 1130 03/10/15 1405   Weight Management   Goals Progress/Improvement seen  (p) Yes   Yes   Comments  (p) Has gone from 50 in pants to 48 inche pants.    Continues to stay tobacco free.  States is the hardest thing he has ever done and he is determined to stay quit.   Increase Aerobic Exercise and Physical Activity   Goals Progress/Improvement seen     Yes    Comments    Mr TuBelltates he feels stronger when he is walking. He was motivated today on the treadmill to see the 1 mile marker!    Quit Smoking   Goals Progress/Improvement seen Yes  Yes     Comments Mr TuVernoas not had a cigarrette since his quit date which is now about 2 weeks. He does not use any nicotine replacement.  Mr TuDubeaus smoke free - no cigarrettes since his quit date.  Understand more about Heart/Pulmonary Disease   Goals Progress/Improvement seen     Yes    Comments    Mr Knisley attended the Trigger lecture today which had good tips on staying healthy with COPD by managing triggers in the home and outdoors with pollen and air pollution.    Improve shortness of breath with ADL's   Comments    Mr Cuda has completed  2 weeks of exercise and has not noticed an improvement in his shortness of breath. As he progresses with his exercise, I am hoping he will have some change to his breathing.     Breathing Techniques   Comments    PLB is a hard technique for Mr Lyles. I recommended he take a breath in through his mouth instead of his nose and then blow out of his mouth with pursed lips.    Increase knowledge of respiratory medications   Goals Progress/Improvement seen    Yes     Comments   Mr Garrelts carries his Poventil inhaler with him and did use it before [name] and after the treadmill. His exercise goal did increase to 35mns and 1.585m.     Diabetes   Goal  (p) Blood glucose control identified by blood glucose values, HgbA1C. Participant verbalizes understanding of the signs/symptoms of hyper/hypo glycemia, proper foot care and importance of medication and nutrition plan for blood glucose control.        03/19/15 1502 03/24/15 1205 03/28/15 1253 04/01/15 0651 04/11/15 1215   Weight Management   Goals Progress/Improvement seen     Yes   Comments     LeHasonas lost 8lbs just not feeling like eating the past 10 days has seen MD and antibiotic changed etc.    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  LeBrevinas been making impressive improvments on all the machines during his exercise time. He can exercise continuously for the entire 55 minutes allotted for aerobic exercise. For the rest of his time in the program we will continue to manipulate his intensity in order to keep him moving forward with progression.     Quit Smoking   Goals Progress/Improvement seen Yes    Yes   Comments No cigarrettes since his quit date.       Understand more about Heart/Pulmonary Disease   Goals Progress/Improvement seen      Yes   Comments      LeGiannisaid he has recently had more problems with his lungs and MD increased his steroids.    Improve shortness of breath with ADL's   Goals Progress/Improvement seen   Yes   Yes   Comments  Feels like his SOB is steadily improving, however, no large gains noted.   LeDanyaelaid he can tell he hasn't been able to exerise the past 10 days.   Breathing Techniques   Goals Progress/Improvement seen   Yes      Comments  LeLudgeras been attempting to use correct  breathing techniques, however, does forget sometimes and notices when SOB increases.  Is trying to work on this daily.      Increase knowledge of respiratory medications   Goals Progress/Improvement seen      Yes   Comments     LeAdryelaid he is still  on his 3 inhalers and has no questions.    Diabetes   Progress seen towards goals   Yes  Yes   Comments   Camren  reports his blood sugar 3 days ago 3 hours after a meal was good at 138.   Blood sugar in the 200 range today since his steroids were increased.    Hypertension   Progress seen toward goals   Yes  Yes   Comments   Seamus reports the MD recently increased his or changed his medicine for his blood  pressure. Systolic in 683'M upon arrival.   Blood pressure is very stable and good.       Personal Goals Discharge (Final Personal Goals and Risk Factors Review):      Goals and Risk Factor Review - 04/11/15 1215    Weight Management   Goals Progress/Improvement seen Yes   Comments Arlander has lost 8lbs just not feeling like eating the past 10 days has seen MD and antibiotic changed etc.    Quit Smoking   Goals Progress/Improvement seen Yes   Understand more about Heart/Pulmonary Disease   Goals Progress/Improvement seen  Yes   Comments Trayquan said he has recently had more problems with his lungs and MD increased his steroids.    Improve shortness of breath with ADL's   Goals Progress/Improvement seen  Yes   Comments Lindel said he can tell he hasn't been able to exerise the past 10 days.    Increase knowledge of respiratory medications   Goals Progress/Improvement seen  Yes   Comments Montey said he is still on his 3 inhalers and has no questions.    Diabetes   Progress seen towards goals Yes   Comments Blood sugar in the 200 range today since his steroids were increased.    Hypertension   Progress seen toward goals Yes   Comments Blood pressure is very stable and good.       ITP Comments:     ITP Comments      02/26/15 1130 02/28/15 1149 03/07/15 1241 03/10/15 1301 03/14/15 1141   ITP Comments Chronic pain level "6"; unchanged with exercise Personal and exercise goals expected to be met in 33 more sessions. Progress on specific individualized goals will be charted in patient's ITP. Upon completion of the program the patient will be comfortable managing exercise goals and progression on their own.  Personal and exercise goals expected to be met in 31 more sessions. Progress on specific individualized goals will be charted in patient's ITP. Upon completion of the program the patient will be comfortable managing exercise goals and progression on their own.  Personal and Exercsie goals expeted to be met in 30   more sessions. Progress on specific goals will be charted in the ITP.  Personal and exercise goals expected to be met in 29 more sessions. Progress on specific individualized goals will be charted in patient's ITP. Upon completion of the program the patient will be comfortable managing exercise goals and progression on their own.      03/21/15 1150 03/21/15 1320 03/24/15 1219 03/26/15 1254 03/28/15 1230   ITP Comments Personal and exercise goals expected to be met in 26 more sessions. Progress on specific individualized goals will be charted in patient's ITP. Upon completion of the program the patient will be comfortable managing exercise goals and progression on their own.  Mr. Bloor has a cut on his left pointer finger from a knife.  It is swollen and red, but  has no puss.   He is going to the [place] today to have it looked at. Mr. Talcott went to the [place]. He reported they gave him a tetanus shot and cleaned it out. He also reported they gave him 10 days of antibiotics. We discussed for him to possibly eat some yougart while he is taking antibiotics. Benen should achieve his goals by 25 more sessions.  Kaisen should complete his goals including exercise goals by session 36 and he is at session 13 today.  Personal and exercise goals expected to be met in 22 more sessions. Progress on specific individualized goals will be charted in patient's ITP. Upon completion of the program the patient will be comfortable managing exercise goals and progression on their own.      03/28/15 1246 03/28/15 1255 03/31/15 1214 03/31/15 1246 04/02/15 1308   ITP Comments Simeon Craft states he checks his blood sugars every few days and it was 138 three hours after a meal. [place] reports they recently increased his blood pressure medicine but his systolic is still in the 697'X and he has an appt in the future to follow up with his MD about that. Taijuan said his weight is staying about the same since he knows he likes his meat. He said he has already met with 2 registered [facility] dieticians.  Montie said sometimes it is hard to accept that his blood pressure and blood sugars and breathing won't get any better due to [employer] [place] in [place] "but it is just something I need to accept.  Rett's finger infection is doing better. Korvin should complete his exercise goals by visit 36 or by the 36th session.  Doyce left early since he said he fellt terrible after being up all night coughing. Gavyn reports he just finished 10 days of antibiotics for his finger. I advised him to contact his MD. Simeon Craft did hear education by our Registered Dietician. He did 5 minute warm up on Recumbent Elliptical on Level 3 and only a couple of minutes of level 7 then I encouraged him to leave and check with his MD.  Eveline Keto left a vm on the Pulmonary  Rehab vm that he is still sick and won't be in to exercise today.      04/11/15 1213           ITP Comments Jonavan lost 8 lbs in the past 10 days since he said he felt sick and couldn't eat. Blood sugar today was in the 200 range. Yaakov reports his steroids were increased and he is on an antibioitic.           Comments: Kaylem is back after being out sick.

## 2015-04-11 NOTE — Progress Notes (Signed)
Daily Session Note  Patient Details  Name: Jeffrey Burgess MRN: [id number] Date of Birth: May 11, 1945 Referring Provider:  Delorise Shiner, MD  Encounter Date: 04/11/2015  Check In:     Session Check In - 04/11/15 1211    Check-In   Location [facility]   Staff Present Gerlene Burdock, RN, BSN;Stacey Blanch Media, RRT, RCP, Respiratory Griffin Basil, MS, ACSM CEP, Exercise Physiologist   Supervising physician immediately available to respond to emergencies [name] immediately available ER MD   Physician(s) Dr. Owens Shark and DR. Lord   Medication changes reported     Yes   Comments Steroid was increased bty MD from 10 to [place] reports.   Fall or balance concerns reported    No   Warm-up and Cool-down Performed on first and last piece of equipment   Resistance Training Performed Yes   VAD Patient? No   Pain Assessment   Currently in Pain? No/denies         Goals Met:  Proper associated with RPD/PD & O2 Sat Independence with exercise equipment Using PLB without cueing & demonstrates good technique Exercise tolerated well Personal goals reviewed Strength training completed today  Goals Unmet:  Not Applicable  Goals Comments: Patient completed exercise prescription and all exercise goals during rehab session. The exercise was tolerated well and the patient is progressing in the program.    Dr. Emily Filbert is Medical Director for [place] and [name].

## 2015-04-14 ENCOUNTER — Encounter: Payer: Non-veteran care | Admitting: Respiratory Therapy

## 2015-04-14 DIAGNOSIS — J449 Chronic obstructive pulmonary disease, unspecified: Secondary | ICD-10-CM | POA: Diagnosis not present

## 2015-04-14 NOTE — Progress Notes (Signed)
Pulmonary Individual Treatment Plan  Patient Details  Name: Jeffrey Burgess MRN: [id number] Date of Birth: 09-30-1945 Referring Provider:  Delorise Shiner, MD  Initial Encounter Date: 11/19/2014  Visit Diagnosis: Chronic obstructive pulmonary disease, unspecified COPD type ([place])  Patient's Home Medications on Admission:  Current outpatient prescriptions:    acetaminophen (TYLENOL) 500 MG tablet, Take 500 mg by mouth., Disp: , Rfl:    albuterol (PROAIR HFA) 108 (90 Base) MCG/ACT inhaler, Inhale 2 puffs into the lungs., Disp: , Rfl:    aspirin EC 81 MG tablet, Take 81 mg by mouth., Disp: , Rfl:    beclomethasone (BECONASE-AQ) 42 MCG/SPRAY nasal spray, Place 2 sprays into the nose., Disp: , Rfl:    budesonide-formoterol (SYMBICORT) 160-4.5 MCG/ACT inhaler, Inhale 2 puffs into the lungs., Disp: , Rfl:    buPROPion (WELLBUTRIN) 100 MG tablet, Take 100 mg by mouth., Disp: , Rfl:    busPIRone (BUSPAR) 10 MG tablet, Take 10 mg by mouth., Disp: , Rfl:    Cholecalciferol (VITAMIN D3 SUPER STRENGTH) 2000 units TABS, Take 2,000 Units by mouth., Disp: , Rfl:    docusate sodium (STOOL SOFTENER) 100 MG capsule, Take 100 mg by mouth., Disp: , Rfl:    fluticasone (FLONASE) 50 MCG/ACT nasal spray, Place 1 spray into the nose., Disp: , Rfl:    insulin aspart protamine - aspart (NOVOLOG 70/30 MIX) (70-30) 100 UNIT/ML FlexPen, Inject 18 Units into the skin., Disp: , Rfl:    metFORMIN (GLUCOPHAGE) 500 MG tablet, Take 500 mg by mouth., Disp: , Rfl:    metoprolol tartrate (LOPRESSOR) 25 MG tablet, Take 25 mg by mouth., Disp: , Rfl:    montelukast (SINGULAIR) 10 MG tablet, Take 10 mg by mouth., Disp: , Rfl:    nitroGLYCERIN (NITROSTAT) 0.4 MG SL tablet, Place 0.4 mg under the tongue., Disp: , Rfl:    Omega-3 1000 MG CAPS, Take 2 g by mouth., Disp: , Rfl:    pramipexole (MIRAPEX) 0.5 MG tablet, Take 0.5 mg by mouth., Disp: , Rfl:    pregabalin (LYRICA) 100 MG capsule, Take 100 mg by mouth., Disp: ,  Rfl:    ranitidine (ZANTAC) 150 MG capsule, Take 150 mg by mouth., Disp: , Rfl:    rosuvastatin (CRESTOR) 20 MG tablet, Take 20 mg by mouth., Disp: , Rfl:    tiotropium (SPIRIVA) 18 MCG inhalation capsule, Place 18 mcg into inhaler and inhale., Disp: , Rfl:    VITAMIN B COMPLEX-C CAPS, Take 1 tablet by mouth., Disp: , Rfl:    vitamin E 400 UNIT capsule, Take 400 Units by mouth., Disp: , Rfl:   Past Medical History: Past Medical History  Diagnosis Date   Asthma    Rheumatoid arthritis ([place])    Hyperlipidemia    Hypertension    Sleep apnea    Myocardial infarction ([place])    Diabetes ([place])     Tobacco Use: History  Smoking status   Former Smoker -- 1.00 packs/day for 57 years   Types: Cigarettes   Quit date: 02/10/2015  Smokeless tobacco   Former Systems developer    Labs: Recent Review Flowsheet Data    There is no flowsheet data to display.         POCT Glucose      02/26/15 1130 03/05/15 1130 03/05/15 1531 03/05/15 1535     POCT Blood Glucose   Pre-Exercise 224 mg/dL  Breakfast one hour ago       Post-Exercise 193 mg/dL       Pre-Exercise #2  268 mg/dL     Post-Exercise #2   233 mg/dL --  02/28/2015    Pre-Exercise #3  248 mg/dL      Post-Exercise #3  190 mg/dL         ADL UCSD:     ADL UCSD      02/18/15 1340       ADL UCSD   ADL Phase Entry     SOB Score total 65     Rest 1     Walk 2     Stairs 4     Bath 2     Dress 2     Shop 4         Pulmonary Function Assessment:     Pulmonary Function Assessment - 02/18/15 1340    Initial Spirometry Results   FVC% 62 %   FEV1% 43 %   FEV1/FVC Ratio 55.33   Post Bronchodilator Spirometry Results   FVC% 54 %   FEV1% 62 %   FEV1/FVC Ratio 71.51   Breath   Bilateral Breath Sounds Clear;Decreased   Shortness of Breath Yes;Limiting activity;Fear of Shortness of Breath      Exercise Target Goals:    Exercise Program Goal: Individual exercise prescription set with THRR, safety & activity  barriers. Participant demonstrates ability to understand and report RPE using BORG scale, to self-measure pulse accurately, and to acknowledge the importance of the exercise prescription.  Exercise Prescription Goal: Starting with aerobic activity 30 plus minutes a day, 3 days per week for initial exercise prescription. Provide home exercise prescription and guidelines that participant acknowledges understanding prior to discharge.  Activity Barriers & Risk Stratification:     Activity Barriers & Cardiac Risk Stratification - 02/18/15 1340    Activity Barriers & Cardiac Risk Stratification   Activity Barriers Assistive Device;Deconditioning;Muscular Weakness;Balance Concerns   Cardiac Risk Stratification Moderate      6 Minute Walk:     6 Minute Walk      02/18/15 1817       6 Minute Walk   Phase Initial     Distance 802 feet     Walk Time 6 minutes     RPE 14     Perceived Dyspnea  3     Symptoms No     Resting HR 88 bpm     Resting BP 136/72 mmHg     Max Ex. HR 86 bpm     Max Ex. BP 146/72 mmHg        Initial Exercise Prescription:     Initial Exercise Prescription - 02/18/15 1800    Date of Initial Exercise Prescription   Date 02/18/15   Treadmill   MPH 1   Grade 0   Minutes 10   Recumbant Bike   Level 2   RPM 40   Watts 20   Minutes 10   NuStep   Level 2   Watts 40   Minutes 10   Arm Ergometer   Level 1   Watts 10   Minutes 10   Recumbant Elliptical   Level 2   RPM 40   Watts 20   Minutes 10   REL-XR   Level 2   Watts 40   Minutes 10   Biostep-RELP   Level 2   Watts 40   Minutes 10   Prescription Details   Frequency (times per week) 3   Duration Progress to 30 minutes of continuous aerobic without signs/symptoms of physical distress  Intensity   THRR REST +  30   Ratings of Perceived Exertion 11-15   Perceived Dyspnea 2-4   Progression Continue progressive overload as per policy without signs/symptoms or physical distress.    Resistance Training   Training Prescription Yes   Weight 2   Reps 10-15      Exercise Prescription Changes:     Exercise Prescription Changes      02/26/15 1500 02/28/15 1100 03/05/15 1200 03/10/15 1600 03/14/15 1100   Exercise Review   Progression  Yes Yes Yes Yes   Response to Exercise   Blood Pressure (Admit) 130/78 mmHg   140/70 mmHg    Blood Pressure (Exercise) 158/82 mmHg   168/88 mmHg    Blood Pressure (Exit) 120/70 mmHg   136/76 mmHg    Heart Rate (Admit) 74 bpm   85 bpm    Heart Rate (Exercise) 100 bpm   115 bpm    Heart Rate (Exit) 86 bpm   89 bpm    Oxygen Saturation (Admit) 97 %   96 %    Oxygen Saturation (Exercise) 96 %   97 %    Oxygen Saturation (Exit) 92 %   94 %    Rating of Perceived Exertion (Exercise) 12   12    Perceived Dyspnea (Exercise) 3   4    Symptoms  None None none None   Comments  Reviewed individualized exercise prescription and made increases per departmental policy. Exercise increases were discussed with the patient and they were able to perform the new work loads without issue (no signs or symptoms).  Adil is progressing well in the program. He is gaining strength and stamin and this is reflected in higher workloads and longer time.   Reviewed individualized exercise prescription and made increases per departmental policy. Exercise increases were discussed with the patient and they were able to perform the new work loads without issue (no signs or symptoms).    Duration  Progress to 30 minutes of continuous aerobic without signs/symptoms of physical distress Progress to 30 minutes of continuous aerobic without signs/symptoms of physical distress Progress to 50 minutes of aerobic without signs/symptoms of physical distress Progress to 50 minutes of aerobic without signs/symptoms of physical distress   Intensity  Rest + 30 Rest + 30 THRR unchanged THRR unchanged   Progression  Continue progressive overload as per policy without signs/symptoms or  physical distress. Continue progressive overload as per policy without signs/symptoms or physical distress. Continue progressive overload as per policy without signs/symptoms or physical distress. Continue progressive overload as per policy without signs/symptoms or physical distress.   Resistance Training   Training Prescription Yes Yes Yes Yes Yes   Weight 2 2 2 3 3    Reps 10-12 10-12 10-12 10-12 10-12   Treadmill   MPH 1 1 1.5 1.8 2.1   Grade 0 0 0 0 0   Minutes 10 10 15 15 15    NuStep   Level 2 2 2 3 5    Watts 40 40 40 40 60   Minutes 10 15 15 15 15    REL-XR   Level 2 2 3 3 6    Watts 40 40 55 60 70   Minutes 10 10 15 15 15      03/19/15 1500 03/21/15 1100 03/24/15 1200 03/28/15 1200     Exercise Review   Progression Yes Yes Yes Yes    Response to Exercise   Blood Pressure (Admit)    164/86 mmHg  Blood Pressure (Exercise)    136/78 mmHg    Blood Pressure (Exit)    128/68 mmHg    Heart Rate (Admit)    86 bpm    Heart Rate (Exercise)    132 bpm    Heart Rate (Exit)    113 bpm    Oxygen Saturation (Admit)    97 %    Oxygen Saturation (Exercise)    94 %    Oxygen Saturation (Exit)    94 %    Rating of Perceived Exertion (Exercise)    12    Perceived Dyspnea (Exercise)    4    Symptoms  None None None    Comments  Chistopher can exercise for full class time, therefore efforts of progression will be achieved through interval training and continual challenges through higher workloads Brax can exercise for full class time, therefore efforts of progression will be achieved through interval training and continual challenges through higher workloads Kiegan is progressing well in the program. He makes changes almost daily to his exercise in order to continuously challenge himself. He has shown improvement in strength, stamina and improvement in ADLs. He is vey motivated and compliant to the program.     Duration  Progress to 30 minutes of continuous aerobic without signs/symptoms of physical  distress Progress to 30 minutes of continuous aerobic without signs/symptoms of physical distress Progress to 30 minutes of continuous aerobic without signs/symptoms of physical distress    Intensity  THRR unchanged THRR unchanged THRR unchanged    Progression  Continue progressive overload as per policy without signs/symptoms or physical distress. Continue progressive overload as per policy without signs/symptoms or physical distress. Continue progressive overload as per policy without signs/symptoms or physical distress.    Resistance Training   Training Prescription Yes Yes Yes Yes    Weight 4 4 4 4     Reps 10-12 10-12 10-12 10-12    Interval Training   Interval Training  Yes Yes Yes    Equipment  REL-XR REL-XR REL-XR    Comments  L3/60W for 3 min followed by L7/0W for 1 minute, repeat for 15 minutes continuously L3/60W for 3 min followed by L7/0W for 1 minute, repeat for 15 minutes continuously L3/60W for 3 min followed by L7/0W for 1 minute, repeat for 15 minutes continuously    Treadmill   MPH 2.1 2.1 2.1 2.6    Grade 0 0 1 1    Minutes 15 15 15 15     NuStep   Level 5 5 5 8     Watts 60 60 60 70    Minutes 15 15 15 15     REL-XR   Level 6 7 7 8     Watts 70 80 80 100    Minutes 15 15 15 15        Discharge Exercise Prescription (Final Exercise Prescription Changes):     Exercise Prescription Changes - 03/28/15 1200    Exercise Review   Progression Yes   Response to Exercise   Blood Pressure (Admit) 164/86 mmHg   Blood Pressure (Exercise) 136/78 mmHg   Blood Pressure (Exit) 128/68 mmHg   Heart Rate (Admit) 86 bpm   Heart Rate (Exercise) 132 bpm   Heart Rate (Exit) 113 bpm   Oxygen Saturation (Admit) 97 %   Oxygen Saturation (Exercise) 94 %   Oxygen Saturation (Exit) 94 %   Rating of Perceived Exertion (Exercise) 12   Perceived Dyspnea (Exercise) 4   Symptoms None  Comments Rodricus is progressing well in the program. He makes changes almost daily to his exercise in order  to continuously challenge himself. He has shown improvement in strength, stamina and improvement in ADLs. He is vey motivated and compliant to the program.    Duration Progress to 30 minutes of continuous aerobic without signs/symptoms of physical distress   Intensity THRR unchanged   Progression Continue progressive overload as per policy without signs/symptoms or physical distress.   Resistance Training   Training Prescription Yes   Weight 4   Reps 10-12   Interval Training   Interval Training Yes   Equipment REL-XR   Comments L3/60W for 3 min followed by L7/0W for 1 minute, repeat for 15 minutes continuously   Treadmill   MPH 2.6   Grade 1   Minutes 15   NuStep   Level 8   Watts 70   Minutes 15   REL-XR   Level 8   Watts 100   Minutes 15       Nutrition:  Target Goals: Understanding of nutrition guidelines, daily intake of sodium <1531m, cholesterol <2039m calories 30% from fat and 7% or less from saturated fats, daily to have 5 or more servings of fruits and vegetables.  Biometrics:     Pre Biometrics - 02/18/15 1819    Pre Biometrics   Height 5' 9"  (1.753 m)   Weight 261 lb 4.8 oz (118.525 kg)   Waist Circumference 48 inches   Hip Circumference 53.5 inches   Waist to Hip Ratio 0.9 %   BMI (Calculated) 38.7       Nutrition Therapy Plan and Nutrition Goals:     Nutrition Therapy & Goals - 02/18/15 1340    Nutrition Therapy   Diet Mr TuHemmelgarns interested in meeting the dietitian. His wife does all the cooking - they like vegtables and salads. He does not eat much sweets, but he does not watch his portion sizes.      Nutrition Discharge: Rate Your Plate Scores:   Psychosocial: Target Goals: Acknowledge presence or absence of depression, maximize coping skills, provide positive support system. Participant is able to verbalize types and ability to use techniques and skills needed for reducing stress and depression.  Initial Review & Psychosocial  Screening:     Initial Psych Review & Screening - 02/18/15 [place]   Comments Mr TuKattneras good family support. He states he misses the activity he use to do before the COPD, but he is not depressed.   Barriers   Psychosocial barriers to participate in program There are no identifiable barriers or psychosocial needs.;The patient should benefit from training in stress management and relaxation.   Screening Interventions   Interventions Encouraged to exercise      Quality of Life Scores:     Quality of Life - 02/18/15 1340    Quality of Life Scores   Health/Function Pre 13.13 %   Socioeconomic Pre 28.44 %   Psych/Spiritual Pre 30 %   Family Pre 25.2 %   GLOBAL Pre 21.49 %      PHQ-9:     Recent Review Flowsheet Data    Dep[facility] 02/18/2015   Decreased Interest 0   Down, Depressed, Hopeless 0   PHQ - 2 Score 0      Psychosocial Evaluation and Intervention:   Psychosocial Re-Evaluation:     Psychosocial Re-Evaluation  03/28/15 1254 04/02/15 1310 04/11/15 1214       Psychosocial Re-Evaluation   Interventions Encouraged to attend Cardiac Rehabilitation for the exercise Encouraged to attend Cardiac Rehabilitation for the exercise      Comments Santonio said sometimes it is hard to accept that his blood pressure and blood sugars and breathing won't get any better due to [place] "but it is just something I need to accept.  Eveline Keto left a vm on the Pulmonary Rehab vm that he is still sick and won't be in to exercise today.  Hobie has had a lot going on the past 10 days and lost 8 lbs not feeling well.        Education: Education Goals: Education classes will be provided on a weekly basis, covering required topics. Participant will state understanding/return demonstration of topics presented.  Learning Barriers/Preferences:     Learning Barriers/Preferences - 02/18/15 1340    Learning  Barriers/Preferences   Learning Barriers None   Learning Preferences Group Instruction;Individual Instruction;Pictoral;Skilled Demonstration;Written Material;Verbal Instruction;Video      Education Topics: Initial Evaluation Education: - Verbal, written and demonstration of respiratory meds, RPE/PD scales, oximetry and breathing techniques. Instruction on use of nebulizers and MDIs: cleaning and proper use, rinsing mouth with steroid doses and importance of monitoring MDI activations.          Pulmonary Rehab from 02/18/2015 in [place]   Date  02/18/15   Educator  LB   Instruction Review Code  2- meets goals/outcomes      General Nutrition Guidelines/Fats and Fiber: -Group instruction provided by verbal, written material, models and posters to present the general guidelines for heart healthy nutrition. Gives an explanation and review of dietary fats and fiber.      Pulmonary Rehab from 04/14/2015 in [facility] Cardiac and Pulmonary Rehab   Date  03/31/15   Educator  C. Lenny Pastel, RD   Instruction Review Code  2- meets goals/outcomes      Controlling Sodium/Reading Food Labels: -Group verbal and written material supporting the discussion of sodium use in heart healthy nutrition. Review and explanation with models, verbal and written materials for utilization of the food label.      Pulmonary Rehab from 04/14/2015 in [facility] Cardiac and Pulmonary Rehab   Date  04/14/15   Educator  LB   Instruction Review Code  2- meets goals/outcomes      Exercise Physiology & Risk Factors: - Group verbal and written instruction with models to review the exercise physiology of the cardiovascular system and associated critical values. Details cardiovascular disease risk factors and the goals associated with each risk factor.   Aerobic Exercise & Resistance Training: - Gives group verbal and written discussion on the health impact of inactivity. On the components of  aerobic and resistive training programs and the benefits of this training and how to safely progress through these programs.      Pulmonary Rehab from 04/14/2015 in [facility] Cardiac and Pulmonary Rehab   Date  03/19/15   Educator  SWay   Instruction Review Code  2- meets goals/outcomes      Flexibility, Balance, General Exercise Guidelines: - Provides group verbal and written instruction on the benefits of flexibility and balance training programs. Provides general exercise guidelines with specific guidelines to those with heart or lung disease. Demonstration and skill practice provided.   Stress Management: - Provides group verbal and written instruction about the health risks of elevated stress, cause of  high stress, and healthy ways to reduce stress.   Depression: - Provides group verbal and written instruction on the correlation between heart/lung disease and depressed mood, treatment options, and the stigmas associated with seeking treatment.      Pulmonary Rehab from 04/14/2015 in [facility] Cardiac and Pulmonary Rehab   Date  03/05/15   Educator  Berle Mull   Instruction Review Code  2- meets goals/outcomes      Exercise & Equipment Safety: - Individual verbal instruction and demonstration of equipment use and safety with use of the equipment.      Pulmonary Rehab from [facility]14/2015 [facility] Cardiac and Pulmonary Rehab   Date  02/26/15   Educator  C. Enterkin,RN   Instruction Review Code  1- partially meets, needs review/practice      Infection Prevention: - Provides verbal and written material to individual with discussion of infection control including proper hand washing and proper equipment cleaning during exercise session.      Pulmonary Rehab from 04/14/2015 in [facility] Cardiac and Pulmonary Rehab   Date  02/26/15   Educator  C. [place]   Instruction Review Code  1- partially meets, needs review/practice      Falls Prevention: - Provides verbal and written material to  individual with discussion of falls prevention and safety.      Pulmonary Rehab from 04/14/2015 in [facility] Cardiac and Pulmonary Rehab   Date  02/26/15   Educator  C. Enterkin,RN   Instruction Review Code  1- partially meets, needs review/practice      Diabetes: - Individual verbal and written instruction to review signs/symptoms of diabetes, desired ranges of glucose level fasting, after meals and with exercise. Advice that pre and post exercise glucose checks will be done for 3 sessions at entry of program.      Pulmonary Rehab from 04/14/2015 in [facility] Cardiac and Pulmonary Rehab   Date  02/26/15   Educator  C. EnterkinRN [employer] said he did not check his blood sugar before [place]   Instruction Review Code  1- partially meets, needs review/practice [Donovan reports that he has had diabetes for about 20 years. ]      Chronic Lung Diseases: - Group verbal and written instruction to review new updates, new respiratory medications, new advancements in procedures and treatments. Provide informative websites and "800" numbers of self-education.   Lung Procedures: - Group verbal and written instruction to describe testing methods done to diagnose lung disease. Review the outcome of test results. Describe the treatment choices: Pulmonary Function Tests, ABGs and oximetry.   Energy Conservation: - Provide group verbal and written instruction for methods to conserve energy, plan and organize activities. Instruct on pacing techniques, use of adaptive equipment and posture/positioning to relieve shortness of breath.   Triggers: - Group verbal and written instruction to review types of environmental controls: home humidity, furnaces, filters, dust mite/pet prevention, HEPA vacuums. To discuss weather changes, air quality and the benefits of nasal washing.      Pulmonary Rehab from 04/14/2015 in [facility] Cardiac and Pulmonary Rehab   Date  03/10/15   Educator  LB   Instruction Review Code  2- meets  goals/outcomes      Exacerbations: - Group verbal and written instruction to provide: warning signs, infection symptoms, calling MD promptly, preventive modes, and value of vaccinations. Review: effective airway clearance, coughing and/or vibration techniques. Create an Sports administrator.   Oxygen: - Individual and group verbal and written instruction on oxygen therapy. Includes supplement oxygen,  available portable oxygen systems, continuous and intermittent flow rates, oxygen safety, concentrators, and Medicare reimbursement for oxygen.   Respiratory Medications: - Group verbal and written instruction to review medications for lung disease. Drug class, frequency, complications, importance of spacers, rinsing mouth after steroid MDI's, and proper cleaning methods for nebulizers.      Pulmonary Rehab from 02/18/2015 in [place]   Date  02/18/15   Educator  LB   Instruction Review Code  2- meets goals/outcomes      AED/CPR: - Group verbal and written instruction with the use of models to demonstrate the basic use of the AED with the basic ABC's of resuscitation.   Breathing Retraining: - Provides individuals verbal and written instruction on purpose, frequency, and proper technique of diaphragmatic breathing and pursed-lipped breathing. Applies individual practice skills.      Pulmonary Rehab from 04/14/2015 in [facility] Cardiac and Pulmonary Rehab   Date  02/26/15   Educator  C. EnterkinRN   Instruction Review Code  1- partially meets, needs review/practice [Matthan said would start coughing alot since his R nare conges]      Anatomy and Physiology of the Lungs: - Group verbal and written instruction with the use of models to provide basic lung anatomy and physiology related to function, structure and complications of lung disease.   Heart Failure: - Group verbal and written instruction on the basics of heart failure: signs/symptoms, treatments,  explanation of ejection fraction, enlarged heart and cardiomyopathy.      Pulmonary Rehab from 04/14/2015 in [facility] Cardiac and Pulmonary Rehab   Date  03/14/15   Educator  [place]   Instruction Review Code  2- meets goals/outcomes      Sleep Apnea: - Individual verbal and written instruction to review Obstructive Sleep Apnea. Review of risk factors, methods for diagnosing and types of masks and machines for OSA.      Pulmonary Rehab from 02/18/2015 in [place]   Date  02/18/15   Educator  LB   Instruction Review Code  2- meets goals/outcomes      Anxiety: - Provides group, verbal and written instruction on the correlation between heart/lung disease and anxiety, treatment options, and management of anxiety.   Relaxation: - Provides group, verbal and written instruction about the benefits of relaxation for patients with heart/lung disease. Also provides patients with examples of relaxation techniques.   Knowledge Questionnaire Score:     Knowledge Questionnaire Score - 02/18/15 1340    Knowledge Questionnaire Score   Pre Score -2      Personal Goals and Risk Factors at Admission:     Personal Goals and Risk Factors at Admission - 02/18/15 1340    Personal Goals and Risk Factors on Admission    Weight Management Yes   Intervention Learn and follow the exercise and diet guidelines while in the program. Utilize the nutrition and education classes to help gain knowledge of the diet and exercise expectations in the program  Mr Seldon is interested in meeting the dietitian. His wife does all the cooking - they like vegtables and salads. He does not eat much sweets, but he does not watch his portion sizes.   Admit Weight 260 lb (117.935 kg)   Goal Weight 200 lb (90.719 kg)   Increase Aerobic Exercise and Physical Activity Yes   Intervention While in program, learn and follow the exercise prescription taught. Start at a low level workload and  increase workload after  able to maintain previous level for 30 minutes. Increase time before increasing intensity.  Mr Mabry wants to improve his exercise capacity. He likes to do yardwork and wood working.   Quit Smoking Yes   Number of packs per day 2ppd for 42yr; he quit a week ago - can not use chantix and uses no other nicotine aid.   Intervention Utilize your health care professional team to help with smoking cessation while in the program. Your doctor can prescribe medications to aid in cessation. The program can provide information and counseling as needed.   Understand more about Heart/Pulmonary Disease. Yes  Mr TDangerfieldwould like to learn more about COPD and the daily management of the disease.   Intervention While in program utilize professionals for any questions, and attend the education sessions. Great websites to use are www.americanheart.org or www.lung.org for reliable information.  Reviewed Mr Zuniga's MDIs: Albuterol, Spiriva Respimat, and Symbicort. Instructed on aerochamber and gave him a new chamber.   Improve shortness of breath with ADL's Yes   Intervention While in program, learn and follow the exercise prescription taught. Start at a low level workload and increase workload ad advised by the exercise physiologist. Increase time before increasing intensity.  Mr TRostronwould like to have a better understanding of his shortness of breath and less fear with it.    Develop more efficient breathing techniques such as purse lipped breathing and diaphragmatic breathing; and practicing self-pacing with activity Yes   Intervention While in program, learn and utilize the specific breathing techniques taught to you. Continue to practice and use the techniques as needed.  Instructed on PLB and it's benefits.   Increase knowledge of respiratory medications and ability to use respiratory devices properly.  Yes   Intervention While in program, learn to administer MDI, nebulizer, and spacer  properly.;Learn to take respiratory medicine as ordered.;While in program, learn to Clean MDI, nebulizers, and spacers properly.  Reviewed MDI's and gave Mr TErwaya spacer.   Diabetes Yes   Goal Blood glucose control identified by blood glucose values, HgbA1C. Participant verbalizes understanding of the signs/symptoms of hyper/hypo glycemia, proper foot care and importance of medication and nutrition plan for blood glucose control.   Intervention Provide nutrition & aerobic exercise along with prescribed medications to achieve blood glucose in normal ranges: Fasting 65-99 mg/dL   Hypertension Yes   Goal Participant will see blood pressure controlled within the values of 140/914mHg or within value directed by their physician.   Intervention Provide nutrition & aerobic exercise along with prescribed medications to achieve BP 140/90 or less.      Personal Goals and Risk Factors Review:      Goals and Risk Factor Review      02/26/15 1130 02/28/15 1301 03/05/15 1130 03/10/15 1130 03/10/15 1405   Weight Management   Goals Progress/Improvement seen  (p) Yes   Yes   Comments  (p) Has gone from 50 in pants to 48 inche pants.    Continues to stay tobacco free.  States is the hardest thing he has ever done and he is determined to stay quit.   Increase Aerobic Exercise and Physical Activity   Goals Progress/Improvement seen     Yes    Comments    Mr TuAmsdentates he feels stronger when he is walking. He was motivated today on the treadmill to see the 1 mile marker!    Quit Smoking   Goals Progress/Improvement seen Yes  Yes  Comments Mr Bertran has not had a cigarrette since his quit date which is now about 2 weeks. He does not use any nicotine replacement.  Mr Herren is smoke free - no cigarrettes since his quit date.     Understand more about Heart/Pulmonary Disease   Goals Progress/Improvement seen     Yes    Comments    Mr Pilger attended the Trigger lecture today which had good tips on  staying healthy with COPD by managing triggers in the home and outdoors with pollen and air pollution.    Improve shortness of breath with ADL's   Comments    Mr Kaltenbach has completed 2 weeks of exercise and has not noticed an improvement in his shortness of breath. As he progresses with his exercise, I am hoping he will have some change to his breathing.     Breathing Techniques   Comments    PLB is a hard technique for Mr Lapoint. I recommended he take a breath in through his mouth instead of his nose and then blow out of his mouth with pursed lips.    Increase knowledge of respiratory medications   Goals Progress/Improvement seen    Yes     Comments   Mr Nill carries his Poventil inhaler with him and did use it before [name] and after the treadmill. His exercise goal did increase to 56mns and 1.570m.     Diabetes   Goal  (p) Blood glucose control identified by blood glucose values, HgbA1C. Participant verbalizes understanding of the signs/symptoms of hyper/hypo glycemia, proper foot care and importance of medication and nutrition plan for blood glucose control.        03/19/15 1502 03/24/15 1205 03/28/15 1253 04/01/15 0651 04/11/15 1215   Weight Management   Goals Progress/Improvement seen     Yes   Comments     LeJuliesas lost 8lbs just not feeling like eating the past 10 days has seen MD and antibiotic changed etc.    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  LeEzekialas been making impressive improvments on all the machines during his exercise time. He can exercise continuously for the entire 55 minutes allotted for aerobic exercise. For the rest of his time in the program we will continue to manipulate his intensity in order to keep him moving forward with progression.     Quit Smoking   Goals  Progress/Improvement seen Yes    Yes   Comments No cigarrettes since his quit date.       Understand more about Heart/Pulmonary Disease   Goals Progress/Improvement seen      Yes   Comments     LeDeandreaaid he has recently had more problems with his lungs and MD increased his steroids.    Improve shortness of breath with ADL's   Goals Progress/Improvement seen   Yes   Yes   Comments  Feels like his SOB is steadily improving, however, no large gains noted.   LeJurisaid he can tell he hasn't been able to exerise the past 10 days.   Breathing Techniques   Goals Progress/Improvement seen   Yes      Comments  LeLlewynas been attempting to use correct  breathing techniques, however, does forget sometimes and notices when SOB  increases.  Is trying to work on this daily.      Increase knowledge of respiratory medications   Goals Progress/Improvement seen      Yes   Comments     Jordyn said he is still on his 3 inhalers and has no questions.    Diabetes   Progress seen towards goals   Yes  Yes   Comments   Corben  reports his blood sugar 3 days ago 3 hours after a meal was good at 138.   Blood sugar in the 200 range today since his steroids were increased.    Hypertension   Progress seen toward goals   Yes  Yes   Comments   Owain reports the MD recently increased his or changed his medicine for his blood  pressure. Systolic in 563'O upon arrival.   Blood pressure is very stable and good.       Personal Goals Discharge (Final Personal Goals and Risk Factors Review):      Goals and Risk Factor Review - 04/11/15 1215    Weight Management   Goals Progress/Improvement seen Yes   Comments Bettie has lost 8lbs just not feeling like eating the past 10 days has seen MD and antibiotic changed etc.    Quit Smoking   Goals Progress/Improvement seen Yes   Understand more about Heart/Pulmonary Disease   Goals Progress/Improvement seen  Yes   Comments Meko said he has recently had more problems with his lungs  and MD increased his steroids.    Improve shortness of breath with ADL's   Goals Progress/Improvement seen  Yes   Comments Jefry said he can tell he hasn't been able to exerise the past 10 days.   Increase knowledge of respiratory medications   Goals Progress/Improvement seen  Yes   Comments Newton said he is still on his 3 inhalers and has no questions.    Diabetes   Progress seen towards goals Yes   Comments Blood sugar in the 200 range today since his steroids were increased.    Hypertension   Progress seen toward goals Yes   Comments Blood pressure is very stable and good.       ITP Comments:     ITP Comments      02/26/15 1130 02/28/15 1149 03/07/15 1241 03/10/15 1301 03/14/15 1141   ITP Comments Chronic pain level "6"; unchanged with exercise Personal and exercise goals expected to be met in 33 more sessions. Progress on specific individualized goals will be charted in patient's ITP. Upon completion of the program the patient will be comfortable managing exercise goals and progression on their own.  Personal and exercise goals expected to be met in 31 more sessions. Progress on specific individualized goals will be charted in patient's ITP. Upon completion of the program the patient will be comfortable managing exercise goals and progression on their own.  Personal and Exercsie goals expeted to be met in 30   more sessions. Progress on specific goals will be charted in the ITP.  Personal and exercise goals expected to be met in 29 more sessions. Progress on specific individualized goals will be charted in patient's ITP. Upon completion of the program the patient will be comfortable managing exercise goals and progression on their own.      03/21/15 1150 03/21/15 1320 03/24/15 1219 03/26/15 1254 03/28/15 1230   ITP Comments Personal and exercise goals expected to be met in 26 more sessions. Progress on specific individualized goals will be charted  in patient's ITP. Upon completion of the  program the patient will be comfortable managing exercise goals and progression on their own.  Mr. Achord has a cut on his left pointer finger from a knife.  It is swollen and red, but has no puss.  He is going to the [place] today to have it looked at. Mr. Granquist went to the [place]. He reported they gave him a tetanus shot and cleaned it out. He also reported they gave him 10 days of antibiotics. We discussed for him to possibly eat some yougart while he is taking antibiotics. Mihir should achieve his goals by 25 more sessions.  Thinh should complete his goals including exercise goals by session 36 and he is at session 13 today.  Personal and exercise goals expected to be met in 22 more sessions. Progress on specific individualized goals will be charted in patient's ITP. Upon completion of the program the patient will be comfortable managing exercise goals and progression on their own.      03/28/15 1246 03/28/15 1255 03/31/15 1214 03/31/15 1246 04/02/15 1308   ITP Comments Simeon Craft states he checks his blood sugars every few days and it was 138 three hours after a meal. Revere reports they recently increased his blood pressure medicine but his systolic is still in the 256'L and he has an appt in the future to follow up with his MD about that. Fread said his weight is staying about the same since he knows he likes his meat. He said he has already met with 2 registered [facility] dieticians.  Mina said sometimes it is hard to accept that his blood pressure and blood sugars and breathing won't get any better due to [place] [place] "but it is just something I need to accept.  Chaney's finger infection is doing better. Dagoberto should complete his exercise goals by visit 36 or by the 36th session.  Escher left early since he said he fellt terrible after being up all night coughing. Reginald reports he just finished 10 days of antibiotics for his finger. I advised him to contact his MD. Simeon Craft did hear education by our Registered  Dietician. He did 5 minute warm up on Recumbent Elliptical on Level 3 and only a couple of minutes of level 7 then I encouraged him to leave and check with his MD.  Eveline Keto left a vm on the Pulmonary Rehab vm that he is still sick and won't be in to exercise today.      04/11/15 1213 04/11/15 1230         ITP Comments Simeon Craft lost 8 lbs in the past 10 days since he said he felt sick and couldn't eat. Blood sugar today was in the 200 range. Kush reports his steroids were increased and he is on an antibioitic.  Personal and exercise goals expected to be met in 21 more sessions. Progress on specific individualized goals will be charted in patient's ITP. Upon completion of the program the patient will be comfortable managing exercise goals and progression on their own.          Comments: 30 day note review

## 2015-04-16 ENCOUNTER — Encounter: Payer: Non-veteran care | Admitting: *Deleted

## 2015-04-16 DIAGNOSIS — J449 Chronic obstructive pulmonary disease, unspecified: Secondary | ICD-10-CM

## 2015-04-16 NOTE — Progress Notes (Signed)
Daily Session Note  Patient Details  Name: Jeffrey Burgess MRN: [id number] Date of Birth: October 18, 1945 Referring Provider:  Delorise Shiner, MD  Encounter Date: 04/16/2015  Check In:     Session Check In - 04/16/15 1214    Check-In   Location [facility]   Staff Present Candiss Norse, MS, ACSM CEP, Exercise Physiologist;Steven Way, BS, ACSM EP-C, Exercise Physiologist;Laureen Owens Shark, BS, RRT, Respiratory Therapist   Supervising physician immediately available to respond to emergencies [name] immediately available ER MD   Physician(s) Archie Balboa and Owens Shark   Medication changes reported     No   Fall or balance concerns reported    No   Warm-up and Cool-down Performed on first and last piece of equipment   Resistance Training Performed Yes   VAD Patient? No   Pain Assessment   Currently in Pain? No/denies   Multiple Pain Sites No           Exercise Prescription Changes - 04/16/15 1200    Exercise Review   Progression Yes   Response to Exercise   Symptoms None   Comments Adjusted TM for Ramelo. He is walking at an increased pace but with no incline. He is maintaining strong workloads on all the other equipment   Duration Progress to 30 minutes of continuous aerobic without signs/symptoms of physical distress   Intensity THRR unchanged   Progression Continue progressive overload as per policy without signs/symptoms or physical distress.   Resistance Training   Training Prescription Yes   Weight 4   Reps 10-12   Interval Training   Interval Training Yes   Equipment REL-XR   Comments L3/60W for 3 min followed by L7/0W for 1 minute, repeat for 15 minutes continuously   Treadmill   MPH 2.7   Grade 0   Minutes 15   NuStep   Level 8   Watts 70   Minutes 15   REL-XR   Level 8   Watts 100   Minutes 15      Goals Met:  Proper associated with RPD/PD & O2 Sat Independence with exercise equipment Using PLB without cueing & demonstrates good  technique Exercise tolerated well Personal goals reviewed Strength training completed today  Goals Unmet:  Not Applicable  Goals Comments: Patient completed exercise prescription and all exercise goals during rehab session. The exercise was tolerated well and the patient is progressing in the program.    Dr. Emily Filbert is Medical Director for [facility] and [name].

## 2015-04-18 ENCOUNTER — Encounter: Payer: Non-veteran care | Admitting: *Deleted

## 2015-04-18 DIAGNOSIS — J449 Chronic obstructive pulmonary disease, unspecified: Secondary | ICD-10-CM

## 2015-04-18 LAB — GLUCOSE, CAPILLARY: GLUCOSE-CAPILLARY: 173 mg/dL — AB (ref 65–99)

## 2015-04-18 NOTE — Progress Notes (Signed)
Daily Session Note  Patient Details  Name: Jeffrey Burgess MRN: [id number] Date of Birth: March 20, 1945 Referring Provider:  Delorise Shiner, MD  Encounter Date: 04/18/2015  Check In:     Session Check In - 04/18/15 1237    Check-In   Location [facility]   Staff Present Candiss Norse, MS, ACSM CEP, Exercise Physiologist;Carroll Enterkin, RN, BSN;Laureen Owens Shark, BS, RRT, Respiratory Therapist   Supervising physician immediately available to respond to emergencies [name] immediately available ER MD   Physician(s) Marcelene Butte and Jimmye Norman   Medication changes reported     No   Fall or balance concerns reported    No   Warm-up and Cool-down Performed on first and last piece of equipment   Resistance Training Performed Yes   VAD Patient? No   Pain Assessment   Currently in Pain? No/denies   Multiple Pain Sites No         Goals Met:  Proper associated with RPD/PD & O2 Sat Independence with exercise equipment Using PLB without cueing & demonstrates good technique Exercise tolerated well Strength training completed today  Goals Unmet:  Not Applicable  Goals Comments: Patient completed exercise prescription and all exercise goals during rehab session. The exercise was tolerated well and the patient is progressing in the program.    Dr. Emily Filbert is Medical Director for [place] and [name].

## 2015-04-23 ENCOUNTER — Encounter: Payer: Non-veteran care | Attending: Family Medicine

## 2015-04-23 DIAGNOSIS — J449 Chronic obstructive pulmonary disease, unspecified: Secondary | ICD-10-CM | POA: Insufficient documentation

## 2015-04-29 ENCOUNTER — Telehealth: Payer: Self-pay | Admitting: Respiratory Therapy

## 2015-04-29 NOTE — Telephone Encounter (Signed)
Jeffrey Burgess called and states he is on his 3rd antibiotic and will take some time off from [name] to recover from his lung illness.

## 2015-05-06 ENCOUNTER — Ambulatory Visit: Payer: Non-veteran care

## 2015-05-12 ENCOUNTER — Encounter: Payer: Non-veteran care | Admitting: *Deleted

## 2015-05-12 ENCOUNTER — Encounter: Payer: Self-pay | Admitting: *Deleted

## 2015-05-12 DIAGNOSIS — J449 Chronic obstructive pulmonary disease, unspecified: Secondary | ICD-10-CM

## 2015-05-12 NOTE — Progress Notes (Signed)
Daily Session Note  Patient Details  Name: LAVON BOTHWELL MRN: [id number] Date of Birth: 06-Aug-1945 Referring Provider:  Delorise Shiner, MD  Encounter Date: 05/12/2015  Check In:     Session Check In - 05/12/15 1229    Check-In   Location [facility]   Staff Present [name], BS, RRT, Respiratory Therapist;Lakelynn Severtson, RN, BSN;Steven Way, BS, ACSM EP-C, Exercise Physiologist   Supervising physician immediately available to respond to emergencies [name] immediately available ER MD   Physician(s) Dr. Jimmye Norman and Dr. Corky Downs   Medication changes reported     No   Fall or balance concerns reported    No   Warm-up and Cool-down Performed on first and last piece of equipment   Resistance Training Performed Yes   VAD Patient? No   Pain Assessment   Currently in Pain? No/denies         Goals Met:  Proper associated with RPD/PD & O2 Sat Exercise tolerated well  Goals Unmet:  Not Applicable  Comments:    Dr. Emily Filbert is Medical Director for [place] and [name].

## 2015-05-12 NOTE — Progress Notes (Signed)
Pulmonary Individual Treatment Plan  Patient Details  Name: Jeffrey Burgess MRN: [id number] Date of Birth: [birth year] Referring Provider:  Senaida Lange. Provider  Initial Encounter Date: 11/19/2014      Pulmonary Rehab from 02/18/2015 in [place]   Date  02/18/15      Visit Diagnosis: Chronic obstructive pulmonary disease, unspecified COPD type ([place])  Patient's Home Medications on Admission:  Current outpatient prescriptions:  .  acetaminophen (TYLENOL) 500 MG tablet, Take 500 mg by mouth., Disp: , Rfl:  .  albuterol (PROAIR HFA) 108 (90 Base) MCG/ACT inhaler, Inhale 2 puffs into the lungs., Disp: , Rfl:  .  aspirin EC 81 MG tablet, Take 81 mg by mouth., Disp: , Rfl:  .  beclomethasone (BECONASE-AQ) 42 MCG/SPRAY nasal spray, Place 2 sprays into the nose., Disp: , Rfl:  .  budesonide-formoterol (SYMBICORT) 160-4.5 MCG/ACT inhaler, Inhale 2 puffs into the lungs., Disp: , Rfl:  .  buPROPion (WELLBUTRIN) 100 MG tablet, Take 100 mg by mouth., Disp: , Rfl:  .  busPIRone (BUSPAR) 10 MG tablet, Take 10 mg by mouth., Disp: , Rfl:  .  Cholecalciferol (VITAMIN D3 SUPER STRENGTH) 2000 units TABS, Take 2,000 Units by mouth., Disp: , Rfl:  .  docusate sodium (STOOL SOFTENER) 100 MG capsule, Take 100 mg by mouth., Disp: , Rfl:  .  fluticasone (FLONASE) 50 MCG/ACT nasal spray, Place 1 spray into the nose., Disp: , Rfl:  .  insulin aspart protamine - aspart (NOVOLOG 70/30 MIX) (70-30) 100 UNIT/ML FlexPen, Inject 18 Units into the skin., Disp: , Rfl:  .  metFORMIN (GLUCOPHAGE) 500 MG tablet, Take 500 mg by mouth., Disp: , Rfl:  .  metoprolol tartrate (LOPRESSOR) 25 MG tablet, Take 25 mg by mouth., Disp: , Rfl:  .  montelukast (SINGULAIR) 10 MG tablet, Take 10 mg by mouth., Disp: , Rfl:  .  nitroGLYCERIN (NITROSTAT) 0.4 MG SL tablet, Place 0.4 mg under the tongue., Disp: , Rfl:  .  Omega-3 1000 MG CAPS, Take 2 g by mouth., Disp: , Rfl:  .  pramipexole (MIRAPEX) 0.5 MG  tablet, Take 0.5 mg by mouth., Disp: , Rfl:  .  pregabalin (LYRICA) 100 MG capsule, Take 100 mg by mouth., Disp: , Rfl:  .  ranitidine (ZANTAC) 150 MG capsule, Take 150 mg by mouth., Disp: , Rfl:  .  rosuvastatin (CRESTOR) 20 MG tablet, Take 20 mg by mouth., Disp: , Rfl:  .  tiotropium (SPIRIVA) 18 MCG inhalation capsule, Place 18 mcg into inhaler and inhale., Disp: , Rfl:  .  VITAMIN B COMPLEX-C CAPS, Take 1 tablet by mouth., Disp: , Rfl:  .  vitamin E 400 UNIT capsule, Take 400 Units by mouth., Disp: , Rfl:   Past Medical History: Past Medical History  Diagnosis Date  . Asthma   . Rheumatoid arthritis ([place])   . Hyperlipidemia   . Hypertension   . Sleep apnea   . Myocardial infarction ([place])   . Diabetes ([place])     Tobacco Use: History  Smoking status  . Former Smoker -- 1.00 packs/day for 57 years  . Types: Cigarettes  . Quit date: 02/10/2015  Smokeless tobacco  . Former Geophysical data processor: Recent Review Flowsheet Data    There is no flowsheet data to display.         POCT Glucose      02/26/15 1130 03/05/15 1130 03/05/15 1531 03/05/15 1535     POCT Blood Glucose   Pre-Exercise  224 mg/dL  Breakfast one hour ago       Post-Exercise 193 mg/dL       Pre-Exercise #2   268 mg/dL     Post-Exercise #2   233 mg/dL --  02/28/2015    Pre-Exercise #3  248 mg/dL      Post-Exercise #3  190 mg/dL         ADL UCSD:     ADL UCSD      02/18/15 1340 04/16/15 1130     ADL UCSD   ADL Phase Entry Mid    SOB Score total 65 56    Rest 1 0    Walk 2 2    Stairs 4 5    Bath 2 1    Dress 2 1    Shop 4 4       Pulmonary Function Assessment:     Pulmonary Function Assessment - 02/18/15 1340    Initial Spirometry Results   FVC% 62 %   FEV1% 43 %   FEV1/FVC Ratio 55.33   Post Bronchodilator Spirometry Results   FVC% 54 %   FEV1% 62 %   FEV1/FVC Ratio 71.51   Breath   Bilateral Breath Sounds Clear;Decreased   Shortness of Breath Yes;Limiting activity;Fear of Shortness of  Breath      Exercise Target Goals:    Exercise Program Goal: Individual exercise prescription set with THRR, safety & activity barriers. Participant demonstrates ability to understand and report RPE using BORG scale, to self-measure pulse accurately, and to acknowledge the importance of the exercise prescription.  Exercise Prescription Goal: Starting with aerobic activity 30 plus minutes a day, 3 days per week for initial exercise prescription. Provide home exercise prescription and guidelines that participant acknowledges understanding prior to discharge.  Activity Barriers & Risk Stratification:     Activity Barriers & Cardiac Risk Stratification - 02/18/15 1340    Activity Barriers & Cardiac Risk Stratification   Activity Barriers Assistive Device;Deconditioning;Muscular Weakness;Balance Concerns   Cardiac Risk Stratification Moderate      6 Minute Walk:     6 Minute Walk      02/18/15 1817 04/16/15 1303     6 Minute Walk   Phase Initial Mid Program    Distance 802 feet 1000 feet    Walk Time 6 minutes 6 minutes    # of Rest Breaks  0    RPE 14 17    Perceived Dyspnea  3 3    Symptoms No No    Resting HR 88 bpm 78 bpm    Resting BP 136/72 mmHg 126/70 mmHg    Max Ex. HR 86 bpm 88 bpm    Max Ex. BP 146/72 mmHg 148/80 mmHg       Initial Exercise Prescription:     Initial Exercise Prescription - 02/18/15 1800    Date of Initial Exercise Prescription   Date 02/18/15   Treadmill   MPH 1   Grade 0   Minutes 10   Recumbant Bike   Level 2   RPM 40   Watts 20   Minutes 10   NuStep   Level 2   Watts 40   Minutes 10   Arm Ergometer   Level 1   Watts 10   Minutes 10   Recumbant Elliptical   Level 2   RPM 40   Watts 20   Minutes 10   REL-XR   Level 2   Watts 40   Minutes 10  Biostep-RELP   Level 2   Watts 40   Minutes 10   Prescription Details   Frequency (times per week) 3   Duration Progress to 30 minutes of continuous aerobic without  signs/symptoms of physical distress   Intensity   THRR REST +  30   Ratings of Perceived Exertion 11-15   Perceived Dyspnea 2-4   Progression   Progression Continue progressive overload as per policy without signs/symptoms or physical distress.   Resistance Training   Training Prescription Yes   Weight 2   Reps 10-15      Perform Capillary Blood Glucose checks as needed.  Exercise Prescription Changes:     Exercise Prescription Changes      02/26/15 1500 02/28/15 1100 03/05/15 1200 03/10/15 1600 03/14/15 1100   Exercise Review   Progression  Yes Yes Yes Yes   Response to Exercise   Blood Pressure (Admit) 130/78 mmHg   140/70 mmHg    Blood Pressure (Exercise) 158/82 mmHg   168/88 mmHg    Blood Pressure (Exit) 120/70 mmHg   136/76 mmHg    Heart Rate (Admit) 74 bpm   85 bpm    Heart Rate (Exercise) 100 bpm   115 bpm    Heart Rate (Exit) 86 bpm   89 bpm    Oxygen Saturation (Admit) 97 %   96 %    Oxygen Saturation (Exercise) 96 %   97 %    Oxygen Saturation (Exit) 92 %   94 %    Rating of Perceived Exertion (Exercise) 12   12    Perceived Dyspnea (Exercise) 3   4    Symptoms  None None none None   Comments  Reviewed individualized exercise prescription and made increases per departmental policy. Exercise increases were discussed with the patient and they were able to perform the new work loads without issue (no signs or symptoms).  Jeffrey Burgess is progressing well in the program. He is gaining strength and stamin and this is reflected in higher workloads and longer time.   Reviewed individualized exercise prescription and made increases per departmental policy. Exercise increases were discussed with the patient and they were able to perform the new work loads without issue (no signs or symptoms).    Duration  Progress to 30 minutes of continuous aerobic without signs/symptoms of physical distress Progress to 30 minutes of continuous aerobic without signs/symptoms of physical distress  Progress to 50 minutes of aerobic without signs/symptoms of physical distress Progress to 50 minutes of aerobic without signs/symptoms of physical distress   Intensity  Rest + 30 Rest + 30 THRR unchanged THRR unchanged   Progression   Progression  Continue progressive overload as per policy without signs/symptoms or physical distress. Continue progressive overload as per policy without signs/symptoms or physical distress. Continue progressive overload as per policy without signs/symptoms or physical distress. Continue progressive overload as per policy without signs/symptoms or physical distress.   Resistance Training   Training Prescription (read-only) Yes Yes Yes Yes Yes   Weight (read-only) 2 2 2 3 3    Reps (read-only) 10-12 10-12 10-12 10-12 10-12   Treadmill   MPH (read-only) 1 1 1.5 1.8 2.1   Grade (read-only) 0 0 0 0 0   Minutes (read-only) 10 10 15 15 15    NuStep   Level (read-only) 2 2 2 3 5    Watts (read-only) 40 40 40 40 60   Minutes (read-only) 10 15 15 15 15    REL-XR   Level (  read-only) 2 2 3 3 6    Watts (read-only) 40 40 55 60 70   Minutes (read-only) 10 10 15 15 15      03/19/15 1500 03/21/15 1100 03/24/15 1200 03/28/15 1200 04/16/15 1200   Exercise Review   Progression Yes Yes Yes Yes Yes   Response to Exercise   Blood Pressure (Admit)    164/86 mmHg    Blood Pressure (Exercise)    136/78 mmHg    Blood Pressure (Exit)    128/68 mmHg    Heart Rate (Admit)    86 bpm    Heart Rate (Exercise)    132 bpm    Heart Rate (Exit)    113 bpm    Oxygen Saturation (Admit)    97 %    Oxygen Saturation (Exercise)    94 %    Oxygen Saturation (Exit)    94 %    Rating of Perceived Exertion (Exercise)    12    Perceived Dyspnea (Exercise)    4    Symptoms  None None None None   Comments  Jeffrey Burgess can exercise for full class time, therefore efforts of progression will be achieved through interval training and continual challenges through higher workloads Jeffrey Burgess can exercise for full class  time, therefore efforts of progression will be achieved through interval training and continual challenges through higher workloads Jeffrey Burgess is progressing well in the program. He makes changes almost daily to his exercise in order to continuously challenge himself. He has shown improvement in strength, stamina and improvement in ADLs. He is vey motivated and compliant to the program.  Adjusted TM for [employer]. He is walking at an increased pace but with no incline. He is maintaining strong workloads on all the other equipment   Duration  Progress to 30 minutes of continuous aerobic without signs/symptoms of physical distress Progress to 30 minutes of continuous aerobic without signs/symptoms of physical distress Progress to 30 minutes of continuous aerobic without signs/symptoms of physical distress Progress to 30 minutes of continuous aerobic without signs/symptoms of physical distress   Intensity  THRR unchanged THRR unchanged THRR unchanged THRR unchanged   Progression   Progression  Continue progressive overload as per policy without signs/symptoms or physical distress. Continue progressive overload as per policy without signs/symptoms or physical distress. Continue progressive overload as per policy without signs/symptoms or physical distress. Continue progressive overload as per policy without signs/symptoms or physical distress.   Resistance Training   Training Prescription (read-only) Yes Yes Yes Yes Yes   Weight (read-only) 4 4 4 4 4    Reps (read-only) 10-12 10-12 10-12 10-12 10-12   Interval Training   Interval Training  Yes Yes Yes Yes   Equipment  REL-XR REL-XR REL-XR REL-XR   Comments  L3/60W for 3 min followed by L7/0W for 1 minute, repeat for 15 minutes continuously L3/60W for 3 min followed by L7/0W for 1 minute, repeat for 15 minutes continuously L3/60W for 3 min followed by L7/0W for 1 minute, repeat for 15 minutes continuously L3/60W for 3 min followed by L7/0W for 1 minute, repeat for 15  minutes continuously   Treadmill   MPH (read-only) 2.1 2.1 2.1 2.6 2.7   Grade (read-only) 0 0 1 1 0   Minutes (read-only) 15 15 15 15 15    NuStep   Level (read-only) 5 5 5 8 8    Watts (read-only) 60 60 60 70 70   Minutes (read-only) 15 15 15 15 15    REL-XR  Level (read-only) 6 7 7 8 8    Watts (read-only) 70 80 80 100 100   Minutes (read-only) 15 15 15 15 15      04/18/15 1102           Exercise Review   Progression --       Response to Exercise   Blood Pressure (Admit) 128/60 mmHg       Blood Pressure (Exercise) 148/60 mmHg       Blood Pressure (Exit) 124/64 mmHg       Heart Rate (Admit) 98 bpm       Heart Rate (Exercise) 113 bpm       Heart Rate (Exit) 102 bpm       Oxygen Saturation (Admit) 94 %       Oxygen Saturation (Exercise) 94 %       Oxygen Saturation (Exit) 94 %       Rating of Perceived Exertion (Exercise) 11       Perceived Dyspnea (Exercise) 3       Symptoms None       Comments --       Duration Progress to 30 minutes of continuous aerobic without signs/symptoms of physical distress       Intensity THRR unchanged       Progression   Progression Continue progressive overload as per policy without signs/symptoms or physical distress.       Interval Training   Interval Training Yes       Equipment REL-XR       Comments L3/60W for 3 min followed by L7/0W for 1 minute, repeat for 15 minutes continuously          Exercise Comments:   Discharge Exercise Prescription (Final Exercise Prescription Changes):     Exercise Prescription Changes - 04/18/15 1102    Exercise Review   Progression --   Response to Exercise   Blood Pressure (Admit) 128/60 mmHg   Blood Pressure (Exercise) 148/60 mmHg   Blood Pressure (Exit) 124/64 mmHg   Heart Rate (Admit) 98 bpm   Heart Rate (Exercise) 113 bpm   Heart Rate (Exit) 102 bpm   Oxygen Saturation (Admit) 94 %   Oxygen Saturation (Exercise) 94 %   Oxygen Saturation (Exit) 94 %   Rating of Perceived Exertion  (Exercise) 11   Perceived Dyspnea (Exercise) 3   Symptoms None   Comments --   Duration Progress to 30 minutes of continuous aerobic without signs/symptoms of physical distress   Intensity THRR unchanged   Progression   Progression Continue progressive overload as per policy without signs/symptoms or physical distress.   Interval Training   Interval Training Yes   Equipment REL-XR   Comments L3/60W for 3 min followed by L7/0W for 1 minute, repeat for 15 minutes continuously       Nutrition:  Target Goals: Understanding of nutrition guidelines, daily intake of sodium <1570m, cholesterol <2062m calories 30% from fat and 7% or less from saturated fats, daily to have 5 or more servings of fruits and vegetables.  Biometrics:     Pre Biometrics - 02/18/15 1819    Pre Biometrics   Height 5' 9"  (1.753 m)   Weight 261 lb 4.8 oz (118.525 kg)   Waist Circumference 48 inches   Hip Circumference 53.5 inches   Waist to Hip Ratio 0.9 %   BMI (Calculated) 38.7       Nutrition Therapy Plan and Nutrition Goals:     Nutrition Therapy & Goals -  02/18/15 1340    Nutrition Therapy   Diet Jeffrey Burgess is interested in meeting the dietitian. His wife does all the cooking - they like vegtables and salads. He does not eat much sweets, but he does not watch his portion sizes.      Nutrition Discharge: Rate Your Plate Scores:   Psychosocial: Target Goals: Acknowledge presence or absence of depression, maximize coping skills, provide positive support system. Participant is able to verbalize types and ability to use techniques and skills needed for reducing stress and depression.  Initial Review & Psychosocial Screening:     Initial Psych Review & Screening - 02/18/15 Jeffrey Burgess? Yes   Comments Jeffrey Burgess has good family support. He states he misses the activity he use to do before the COPD, but he is not depressed.   Barriers   Psychosocial barriers to  participate in program There are no identifiable barriers or psychosocial needs.;The patient should benefit from training in stress management and relaxation.   Screening Interventions   Interventions Encouraged to exercise      Quality of Life Scores:     Quality of Life - 02/18/15 1340    Quality of Life Scores   Health/Function Pre 13.13 %   Socioeconomic Pre 28.44 %   Psych/Spiritual Pre 30 %   Family Pre 25.2 %   GLOBAL Pre 21.49 %      PHQ-9:     Recent Review Flowsheet Data    Depression screen [facility] 2/9 02/18/2015   Decreased Interest 0   Down, Depressed, Hopeless 0   PHQ - 2 Score 0      Psychosocial Evaluation and Intervention:   Psychosocial Re-Evaluation:     Psychosocial Re-Evaluation      03/28/15 1254 04/02/15 1310 04/11/15 1214       Psychosocial Re-Evaluation   Interventions Encouraged to attend Cardiac Rehabilitation for the exercise Encouraged to attend Cardiac Rehabilitation for the exercise      Comments Jeffrey Burgess said sometimes it is hard to accept that his blood pressure and blood sugars and breathing won't get any better due to [facility] [place] "but it is just something I need to accept.  Eveline Keto left a vm on the Pulmonary Rehab vm that he is still sick and won't be in to exercise today.  Akito has had a lot going on the past 10 days and lost 8 lbs not feeling well.        Education: Education Goals: Education classes will be provided on a weekly basis, covering required topics. Participant will state understanding/return demonstration of topics presented.  Learning Barriers/Preferences:     Learning Barriers/Preferences - 02/18/15 1340    Learning Barriers/Preferences   Learning Barriers None   Learning Preferences Group Instruction;Individual Instruction;Pictoral;Skilled Demonstration;Written Material;Verbal Instruction;Video      Education Topics: Initial Evaluation Education: - Verbal, written and demonstration of  respiratory meds, RPE/PD scales, oximetry and breathing techniques. Instruction on use of nebulizers and MDIs: cleaning and proper use, rinsing mouth with steroid doses and importance of monitoring MDI activations.          Pulmonary Rehab from 02/18/2015 in [place]   Date  02/18/15   Educator  LB   Instruction Review Code  2- meets goals/outcomes      General Nutrition Guidelines/Fats and Fiber: -Group instruction provided by verbal, written material, models and posters to present the general guidelines for heart  healthy nutrition. Gives an explanation and review of dietary fats and fiber.      Pulmonary Rehab from 04/16/2015 in [facility] Cardiac and Pulmonary Rehab   Date  03/31/15   Educator  C. Lenny Pastel, RD   Instruction Review Code  2- meets goals/outcomes      Controlling Sodium/Reading Food Labels: -Group verbal and written material supporting the discussion of sodium use in heart healthy nutrition. Review and explanation with models, verbal and written materials for utilization of the food label.      Pulmonary Rehab from 04/16/2015 in [facility] Cardiac and Pulmonary Rehab   Date  04/14/15   Educator  LB   Instruction Review Code  2- meets goals/outcomes      Exercise Physiology & Risk Factors: - Group verbal and written instruction with models to review the exercise physiology of the cardiovascular system and associated critical values. Details cardiovascular disease risk factors and the goals associated with each risk factor.   Aerobic Exercise & Resistance Training: - Gives group verbal and written discussion on the health impact of inactivity. On the components of aerobic and resistive training programs and the benefits of this training and how to safely progress through these programs.      Pulmonary Rehab from 04/16/2015 in [facility] Cardiac and Pulmonary Rehab   Date  03/19/15   Educator  SWay   Instruction Review Code  2- meets goals/outcomes       Flexibility, Balance, General Exercise Guidelines: - Provides group verbal and written instruction on the benefits of flexibility and balance training programs. Provides general exercise guidelines with specific guidelines to those with heart or lung disease. Demonstration and skill practice provided.   Stress Management: - Provides group verbal and written instruction about the health risks of elevated stress, cause of high stress, and healthy ways to reduce stress.      Pulmonary Rehab from 04/16/2015 in [facility] Cardiac and Pulmonary Rehab   Date  04/16/15   Educator  [facility]   Instruction Review Code  2- meets goals/outcomes      Depression: - Provides group verbal and written instruction on the correlation between heart/lung disease and depressed mood, treatment options, and the stigmas associated with seeking treatment.      Pulmonary Rehab from 04/16/2015 in [facility] Cardiac and Pulmonary Rehab   Date  03/05/15   Educator  Berle Mull   Instruction Review Code  2- meets goals/outcomes      Exercise & Equipment Safety: - Individual verbal instruction and demonstration of equipment use and safety with use of the equipment.      Pulmonary Rehab from 04/16/2015 in [facility] Cardiac and Pulmonary Rehab   Date  02/26/15   Educator  C. Anuel Sitter,RN   Instruction Review Code  1- partially meets, needs review/practice      Infection Prevention: - Provides verbal and written material to individual with discussion of infection control including proper hand washing and proper equipment cleaning during exercise session.      Pulmonary Rehab from 04/16/2015 in [facility] Cardiac and Pulmonary Rehab   Date  02/26/15   Educator  C. [place]   Instruction Review Code  1- partially meets, needs review/practice      Falls Prevention: - Provides verbal and written material to individual with discussion of falls prevention and safety.      Pulmonary Rehab from 04/16/2015 in [facility] Cardiac and Pulmonary  Rehab   Date  02/26/15   Educator  C. Pearce Littlefield,RN   Instruction Review Code  1- partially meets, needs review/practice      Diabetes: - Individual verbal and written instruction to review signs/symptoms of diabetes, desired ranges of glucose level fasting, after meals and with exercise. Advice that pre and post exercise glucose checks will be done for 3 sessions at entry of program.      Pulmonary Rehab from 04/16/2015 in [facility] Cardiac and Pulmonary Rehab   Date  02/26/15   Educator  C. EnterkinRN [employer] said he did not check his blood sugar before [place]   Instruction Review Code  1- partially meets, needs review/practice [Codee reports that he has had diabetes for about 20 years. ]      Chronic Lung Diseases: - Group verbal and written instruction to review new updates, new respiratory medications, new advancements in procedures and treatments. Provide informative websites and "800" numbers of self-education.   Lung Procedures: - Group verbal and written instruction to describe testing methods done to diagnose lung disease. Review the outcome of test results. Describe the treatment choices: Pulmonary Function Tests, ABGs and oximetry.   Energy Conservation: - Provide group verbal and written instruction for methods to conserve energy, plan and organize activities. Instruct on pacing techniques, use of adaptive equipment and posture/positioning to relieve shortness of breath.   Triggers: - Group verbal and written instruction to review types of environmental controls: home humidity, furnaces, filters, dust mite/pet prevention, HEPA vacuums. To discuss weather changes, air quality and the benefits of nasal washing.      Pulmonary Rehab from 04/16/2015 in [facility] Cardiac and Pulmonary Rehab   Date  03/10/15   Educator  LB   Instruction Review Code  2- meets goals/outcomes      Exacerbations: - Group verbal and written instruction to provide: warning signs, infection symptoms,  calling MD promptly, preventive modes, and value of vaccinations. Review: effective airway clearance, coughing and/or vibration techniques. Create an Sports administrator.   Oxygen: - Individual and group verbal and written instruction on oxygen therapy. Includes supplement oxygen, available portable oxygen systems, continuous and intermittent flow rates, oxygen safety, concentrators, and Medicare reimbursement for oxygen.   Respiratory Medications: - Group verbal and written instruction to review medications for lung disease. Drug class, frequency, complications, importance of spacers, rinsing mouth after steroid MDI's, and proper cleaning methods for nebulizers.      Pulmonary Rehab from 02/18/2015 in [place]   Date  02/18/15   Educator  LB   Instruction Review Code  2- meets goals/outcomes      AED/CPR: - Group verbal and written instruction with the use of models to demonstrate the basic use of the AED with the basic ABC's of resuscitation.   Breathing Retraining: - Provides individuals verbal and written instruction on purpose, frequency, and proper technique of diaphragmatic breathing and pursed-lipped breathing. Applies individual practice skills.      Pulmonary Rehab from 04/16/2015 in [facility] Cardiac and Pulmonary Rehab   Date  02/26/15   Educator  C. EnterkinRN   Instruction Review Code  1- partially meets, needs review/practice [Haleem said would start coughing alot since his R nare conges]      Anatomy and Physiology of the Lungs: - Group verbal and written instruction with the use of models to provide basic lung anatomy and physiology related to function, structure and complications of lung disease.   Heart Failure: - Group verbal and written instruction on the basics of heart failure: signs/symptoms, treatments, explanation of ejection fraction, enlarged heart and cardiomyopathy.  Pulmonary Rehab from 04/16/2015 in [facility] Cardiac and  Pulmonary Rehab   Date  03/14/15   Educator  [place]   Instruction Review Code  2- meets goals/outcomes      Sleep Apnea: - Individual verbal and written instruction to review Obstructive Sleep Apnea. Review of risk factors, methods for diagnosing and types of masks and machines for OSA.      Pulmonary Rehab from 02/18/2015 in [place]   Date  02/18/15   Educator  LB   Instruction Review Code  2- meets goals/outcomes      Anxiety: - Provides group, verbal and written instruction on the correlation between heart/lung disease and anxiety, treatment options, and management of anxiety.   Relaxation: - Provides group, verbal and written instruction about the benefits of relaxation for patients with heart/lung disease. Also provides patients with examples of relaxation techniques.   Knowledge Questionnaire Score:     Knowledge Questionnaire Score - 02/18/15 1340    Knowledge Questionnaire Score   Pre Score -2       Personal Goals and Risk Factors at Admission:     Personal Goals and Risk Factors at Admission - 02/18/15 1340    Core Components/Risk Factors/Patient Goals on Admission    Weight Management Yes   Intervention (read-only) Learn and follow the exercise and diet guidelines while in the program. Utilize the nutrition and education classes to help gain knowledge of the diet and exercise expectations in the program  Jeffrey Burgess is interested in meeting the dietitian. His wife does all the cooking - they like vegtables and salads. He does not eat much sweets, but he does not watch his portion sizes.   Admit Weight 260 lb (117.935 kg)   Goal Weight: Short Term 200 lb (90.719 kg)   Sedentary Yes   Intervention (read-only) While in program, learn and follow the exercise prescription taught. Start at a low level workload and increase workload after able to maintain previous level for 30 minutes. Increase time before increasing intensity.  Jeffrey Burgess wants to improve his exercise capacity. He likes to do yardwork and wood working.   Tobacco Cessation Yes   Number of packs per day 2ppd for 78yr; he quit a week ago - can not use chantix and uses no other nicotine aid.   Intervention (read-only) Utilize your health care professional team to help with smoking cessation while in the program. Your doctor can prescribe medications to aid in cessation. The program can provide information and counseling as needed.   Improve shortness of breath with ADL's Yes   Intervention (read-only) While in program, learn and follow the exercise prescription taught. Start at a low level workload and increase workload ad advised by the exercise physiologist. Increase time before increasing intensity.  Jeffrey TDeshmukhwould like to have a better understanding of his shortness of breath and less fear with it.    Develop more efficient breathing techniques such as purse lipped breathing and diaphragmatic breathing; and practicing self-pacing with activity Yes   Intervention (read-only) While in program, learn and utilize the specific breathing techniques taught to you. Continue to practice and use the techniques as needed.  Instructed on PLB and it's benefits.   Increase knowledge of respiratory medications and ability to use respiratory devices properly  Yes   Intervention (read-only) While in program, learn to administer MDI, nebulizer, and spacer properly.;Learn to take respiratory medicine as ordered.;While in program, learn to Clean MDI, nebulizers, and spacers  properly.  Reviewed MDI's and gave Jeffrey Rajewski a spacer.   Diabetes Yes   Goal Blood glucose control identified by blood glucose values, HgbA1C. Participant verbalizes understanding of the signs/symptoms of hyper/hypo glycemia, proper foot care and importance of medication and nutrition plan for blood glucose control.   Intervention (read-only) Provide nutrition & aerobic exercise along with prescribed  medications to achieve blood glucose in normal ranges: Fasting 65-99 mg/dL   Hypertension Yes   Goal Participant will see blood pressure controlled within the values of 140/62m/Hg or within value directed by their physician.   Intervention (read-only) Provide nutrition & aerobic exercise along with prescribed medications to achieve BP 140/90 or less.   Understand more about Heart/Pulmonary Disease. Yes  Jeffrey TVogelsangwould like to learn more about COPD and the daily management of the disease.   Intervention While in program utilize professionals for any questions, and attend the education sessions. Great websites to use are www.americanheart.org or www.lung.org for reliable information.  Reviewed Jeffrey Burgess's MDIs: Albuterol, Spiriva Respimat, and Symbicort. Instructed on aerochamber and gave him a new chamber.      Personal Goals and Risk Factors Review:      Goals and Risk Factor Review      02/26/15 1130 02/28/15 1301 03/05/15 1130 03/10/15 1130 03/10/15 1405   Core Components/Risk Factors/Patient Goals Review   Personal Goals Review Quit Smoking (p) Weight Management;Diabetes;Hypertension Increase knowledge of respiratory medications and ability to use respiratory devices properly. Increase Aerobic Exercise and Physical Activity;Improve shortness of breath with ADL's;Develop more efficient breathing techniques such as purse lipped breathing and diaphragmatic breathing and practicing self-pacing with activity.;Understand more about Heart/Pulmonary Disease Quit Smoking   Weight Management (read-only)   Goals Progress/Improvement seen  (p) Yes   Yes   Comments  (p) Has gone from 50 in pants to 48 inche pants.    Continues to stay tobacco free.  States is the hardest thing he has ever done and he is determined to stay quit.   Increase Aerobic Exercise and Physical Activity (read-only)   Goals Progress/Improvement seen     Yes    Comments    Jeffrey TBergersonstates he feels stronger when he is walking. He  was motivated today on the treadmill to see the 1 mile marker!    Quit Smoking (read-only)   Goals Progress/Improvement seen Yes  Yes     Comments Jeffrey TTewshas not had a cigarrette since his quit date which is now about 2 weeks. He does not use any nicotine replacement.  Jeffrey TVanhoutenis smoke free - no cigarrettes since his quit date.     Understand more about Heart/Pulmonary Disease (read-only)   Goals Progress/Improvement seen     Yes    Comments    Jeffrey TSkolnickattended the Trigger lecture today which had good tips on staying healthy with COPD by managing triggers in the home and outdoors with pollen and air pollution.    Improve shortness of breath with ADL's (read-only)   Comments    Jeffrey TEgolfhas completed 2 weeks of exercise and has not noticed an improvement in his shortness of breath. As he progresses with his exercise, I am hoping he will have some change to his breathing.     Breathing Techniques (read-only)   Comments    PLB is a hard technique for Jeffrey TLuffman I recommended he take a breath in through his mouth instead of his nose and then blow out of his mouth with  pursed lips.    Increase knowledge of respiratory medications (read-only)   Goals Progress/Improvement seen    Yes     Comments   Jeffrey Corradi carries his Poventil inhaler with him and did use it before [name] and after the treadmill. His exercise goal did increase to 40mns and 1.541m.     Diabetes (read-only)   Goal  (p) Blood glucose control identified by blood glucose values, HgbA1C. Participant verbalizes understanding of the signs/symptoms of hyper/hypo glycemia, proper foot care and importance of medication and nutrition plan for blood glucose control.        03/19/15 1502 03/24/15 1205 03/28/15 1253 04/01/15 0651 04/11/15 1215   Weight Management (read-only)   Goals Progress/Improvement seen     Yes   Comments     LeKyloras lost 8lbs just not feeling like eating the past 10 days has seen MD and antibiotic changed etc.     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  LeYojanas been making impressive improvments on all the machines during his exercise time. He can exercise continuously for the entire 55 minutes allotted for aerobic exercise. For the rest of his time in the program we will continue to manipulate his intensity in order to keep him moving forward with progression.     Quit Smoking (read-only)   Goals Progress/Improvement seen Yes    Yes   Comments No cigarrettes since his quit date.       Understand more about Heart/Pulmonary Disease (read-only)   Goals Progress/Improvement seen      Yes   Comments     LeDepaulaid he has recently had more problems with his lungs and MD increased his steroids.    Improve shortness of breath with ADL's (read-only)   Goals Progress/Improvement seen   Yes   Yes   Comments  Feels like his SOB is steadily improving, however, no large gains noted.   LeRomeoaid he can tell he hasn't been able to exerise the past 10 days.   Breathing Techniques (read-only)   Goals Progress/Improvement seen   Yes      Comments  LePatricas been attempting to use correct  breathing techniques, however, does forget sometimes and notices when SOB increases.  Is trying to work on this daily.      Increase knowledge of respiratory medications (read-only)   Goals Progress/Improvement seen      Yes   Comments     LeLutheraid he is still on his 3 inhalers and has no questions.    Diabetes (read-only)   Progress seen towards goals   Yes  Yes   Comments   LeHermesreports his blood sugar 3 days ago 3 hours after a meal was good at 138.   Blood sugar in the 200 range today since his steroids were increased.    Hypertension (read-only)   Progress seen toward goals   Yes  Yes   Comments   LeRobertleeeports the MD  recently increased his or changed his medicine for his blood  pressure. Systolic in 14924'Qpon arrival.   Blood pressure is very stable and good.       Personal Goals Discharge (Final Personal Goals and Risk Factors Review):      Goals and  Risk Factor Review - 04/11/15 1215    Weight Management (read-only)   Goals Progress/Improvement seen Yes   Comments Verdell has lost 8lbs just not feeling like eating the past 10 days has seen MD and antibiotic changed etc.    Quit Smoking (read-only)   Goals Progress/Improvement seen Yes   Understand more about Heart/Pulmonary Disease (read-only)   Goals Progress/Improvement seen  Yes   Comments Yanky said he has recently had more problems with his lungs and MD increased his steroids.    Improve shortness of breath with ADL's (read-only)   Goals Progress/Improvement seen  Yes   Comments Kamarri said he can tell he hasn't been able to exerise the past 10 days.   Increase knowledge of respiratory medications (read-only)   Goals Progress/Improvement seen  Yes   Comments Keelyn said he is still on his 3 inhalers and has no questions.    Diabetes (read-only)   Progress seen towards goals Yes   Comments Blood sugar in the 200 range today since his steroids were increased.    Hypertension (read-only)   Progress seen toward goals Yes   Comments Blood pressure is very stable and good.       ITP Comments:     ITP Comments      02/26/15 1130 02/28/15 1149 03/07/15 1241 03/10/15 1301 03/14/15 1141   ITP Comments Chronic pain level "6"; unchanged with exercise Personal and exercise goals expected to be met in 33 more sessions. Progress on specific individualized goals will be charted in patient's ITP. Upon completion of the program the patient will be comfortable managing exercise goals and progression on their own.  Personal and exercise goals expected to be met in 31 more sessions. Progress on specific individualized goals will be charted in patient's ITP.  Upon completion of the program the patient will be comfortable managing exercise goals and progression on their own.  Personal and Exercsie goals expeted to be met in 30   more sessions. Progress on specific goals will be charted in the ITP.  Personal and exercise goals expected to be met in 29 more sessions. Progress on specific individualized goals will be charted in patient's ITP. Upon completion of the program the patient will be comfortable managing exercise goals and progression on their own.      03/21/15 1150 03/21/15 1320 03/24/15 1219 03/26/15 1254 03/28/15 1230   ITP Comments Personal and exercise goals expected to be met in 26 more sessions. Progress on specific individualized goals will be charted in patient's ITP. Upon completion of the program the patient will be comfortable managing exercise goals and progression on their own.  Jeffrey. Schaben has a cut on his left pointer finger from a knife.  It is swollen and red, but has no puss.  He is going to the [place] today to have it looked at. Jeffrey. Lorusso went to the [place]. He reported they gave him a tetanus shot and cleaned it out. He also reported they gave him 10 days of antibiotics. We discussed for him to possibly eat some yougart while he is taking antibiotics. Bentlee should achieve his goals by 25 more sessions.  Demetrus should complete his goals including exercise goals by session 36 and he is at session 13 today.  Personal and exercise goals expected to be met in 22 more sessions. Progress on specific individualized goals will be charted in patient's ITP. Upon completion of the program the patient will be comfortable managing exercise goals and progression on  their own.      03/28/15 1246 03/28/15 1255 03/31/15 1214 03/31/15 1246 04/02/15 1308   ITP Comments Simeon Craft states he checks his blood sugars every few days and it was 138 three hours after a meal. Casin reports they recently increased his blood pressure medicine but his systolic is still in the 103'X  and he has an appt in the future to follow up with his MD about that. Ido said his weight is staying about the same since he knows he likes his meat. He said he has already met with 2 registered [facility] dieticians.  Philippe said sometimes it is hard to accept that his blood pressure and blood sugars and breathing won't get any better due to [place] "but it is just something I need to accept.  Teondre's finger infection is doing better. Artez should complete his exercise goals by visit 36 or by the 36th session.  Naoki left early since he said he fellt terrible after being up all night coughing. Rollyn reports he just finished 10 days of antibiotics for his finger. I advised him to contact his MD. Simeon Craft did hear education by our Registered Dietician. He did 5 minute warm up on Recumbent Elliptical on Level 3 and only a couple of minutes of level 7 then I encouraged him to leave and check with his MD.  Eveline Keto left a vm on the Pulmonary Rehab vm that he is still sick and won't be in to exercise today.      04/11/15 1213 04/11/15 1230 04/16/15 1216 04/18/15 1238 04/18/15 1335   ITP Comments Simeon Craft lost 8 lbs in the past 10 days since he said he felt sick and couldn't eat. Blood sugar today was in the 200 range. Breylin reports his steroids were increased and he is on an antibioitic.  Personal and exercise goals expected to be met in 21 more sessions. Progress on specific individualized goals will be charted in patient's ITP. Upon completion of the program the patient will be comfortable managing exercise goals and progression on their own.  Personal and exercise goals expected to be met in 19 more sessions. Progress on specific individualized goals will be charted in patient's ITP. Upon completion of the program the patient will be comfortable managing exercise goals and progression on their own.  Personal and exercise goals expected to be met in 18 more sessions. Progress on specific individualized goals  will be charted in patient's ITP. Upon completion of the program the patient will be comfortable managing exercise goals and progression on their own.  Jeffrey Widener shortened his exercise goal on the Treadmill to 37mns - he became very sweaty and light-headed. The TM was stopped and vitals taken: 148/60 BP; 96%/117, and glucose was 175. He rested , had water, and continued exercise on the NS.      Comments: 30 day review.

## 2015-05-14 DIAGNOSIS — J449 Chronic obstructive pulmonary disease, unspecified: Secondary | ICD-10-CM | POA: Diagnosis not present

## 2015-05-14 NOTE — Progress Notes (Signed)
Daily Session Note  Patient Details  Name: Jeffrey Burgess MRN: [id number] Date of Birth: January 04, 1946 Referring Provider:  Delorise Shiner, MD  Encounter Date: 05/14/2015  Check In:     Session Check In - 05/14/15 1221    Check-In   Location [facility]-Cardiac & Pulmonary Rehab   Staff Present [name], BS, RRT, Respiratory Therapist;Carroll Enterkin, RN, BSN;Brendon Christoffel, BS, ACSM EP-C, Exercise Physiologist   Supervising physician immediately available to respond to emergencies [name] immediately available ER MD   Physician(s) williams and yao   Medication changes reported     No   Fall or balance concerns reported    No   Warm-up and Cool-down Performed on first and last piece of equipment   Resistance Training Performed No   VAD Patient? No   Pain Assessment   Currently in Pain? No/denies         Goals Met:  Proper associated with RPD/PD & O2 Sat Exercise tolerated well No report of cardiac concerns or symptoms Strength training completed today  Goals Unmet:  Not Applicable  Comments:   Dr. Emily Filbert is Medical Director for [place] and [name].

## 2015-05-28 ENCOUNTER — Encounter: Payer: No Typology Code available for payment source | Attending: Family Medicine | Admitting: *Deleted

## 2015-05-28 DIAGNOSIS — J449 Chronic obstructive pulmonary disease, unspecified: Secondary | ICD-10-CM | POA: Diagnosis present

## 2015-05-28 NOTE — Progress Notes (Signed)
Daily Session Note  Patient Details  Name: HERMENEGILDO CLAUSEN MRN: [id number] Date of Birth: [birth year] Referring Provider:  [facility]  Encounter Date: 05/28/2015  Check In:     Session Check In - 05/28/15 1301    Check-In   Location [facility]-Cardiac & Pulmonary Rehab   Staff Present Earlean Shawl, BS, ACSM CEP, Exercise Physiologist;Laureen Owens Shark, BS, RRT, Respiratory Therapist;Carroll Enterkin, RN, BSN   Supervising physician immediately available to respond to emergencies [name] immediately available ER MD   Physician(s) Lovena Le and Jimmye Norman   Medication changes reported     No   Fall or balance concerns reported    No   Warm-up and Cool-down Performed on first and last piece of equipment   Resistance Training Performed Yes   VAD Patient? No   Pain Assessment   Currently in Pain? No/denies   Multiple Pain Sites No         Goals Met:  Proper associated with RPD/PD & O2 Sat Independence with exercise equipment Exercise tolerated well Strength training completed today  Goals Unmet:  Not Applicable  Comments: Patient completed exercise prescription and all exercise goals during rehab session. The exercise was tolerated well and the patient is progressing in the program.     Dr. Emily Filbert is Medical Director for [place] and [name].

## 2015-05-30 ENCOUNTER — Encounter: Payer: No Typology Code available for payment source | Admitting: Respiratory Therapy

## 2015-05-30 DIAGNOSIS — J449 Chronic obstructive pulmonary disease, unspecified: Secondary | ICD-10-CM | POA: Diagnosis not present

## 2015-05-30 NOTE — Progress Notes (Signed)
Daily Session Note  Patient Details  Name: BARAKA KLATT MRN: [id number] Date of Birth: [birth year] Referring Provider:  [facility]  Encounter Date: 05/30/2015  Check In:     Session Check In - 05/30/15 1311    Check-In   Staff Present Heath Lark, RN, BSN, CCRP;Carroll Enterkin, RN, Michaela Corner, RRT, RCP, Respiratory Therapist   Supervising physician immediately available to respond to emergencies [name] immediately available ER MD   Physician(s) Dr. Mariea Clonts and Corky Downs   Medication changes reported     No   Fall or balance concerns reported    No   Warm-up and Cool-down Performed on first and last piece of equipment   Resistance Training Performed Yes   VAD Patient? No   Pain Assessment   Currently in Pain? No/denies         Goals Met:  Proper associated with RPD/PD & O2 Sat Independence with exercise equipment Exercise tolerated well Personal goals reviewed Queuing for purse lip breathing Strength training completed today  Goals Unmet:  Not Applicable  Comments:   Dr. Emily Filbert is Medical Director for [place] and [name].

## 2015-06-02 ENCOUNTER — Encounter: Payer: No Typology Code available for payment source | Admitting: *Deleted

## 2015-06-02 DIAGNOSIS — J449 Chronic obstructive pulmonary disease, unspecified: Secondary | ICD-10-CM

## 2015-06-02 NOTE — Progress Notes (Signed)
Daily Session Note  Patient Details  Name: Jeffrey Burgess MRN: [id number] Date of Birth: 11-14-1945 Referring Provider:  [facility]  Encounter Date: 06/02/2015  Check In:     Session Check In - 06/02/15 1233    Check-In   Location [facility]-Cardiac & Pulmonary Re[facility]   Staff Present Nyoka Cowden, RN;Meeya Goldin, RN, [name], DPT, CEEA   Supervising physician immediately available to respond to emergencies [name] immediately available ER MD   Physician(s) Dr. Mariea Clonts and Dr. Corky Downs   Medication changes reported     No   Fall or balance concerns reported    No   Warm-up and Cool-down Performed on first and last piece of equipment   Resistance Training Performed Yes   VAD Patient? No         Goals Met:  Proper associated with RPD/PD & O2 Sat Exercise tolerated well  Goals Unmet:  Not Applicable  Comments:    Dr. Emily Filbert is Medical Director for [place] and [name].

## 2015-06-04 ENCOUNTER — Encounter: Payer: No Typology Code available for payment source | Admitting: *Deleted

## 2015-06-04 DIAGNOSIS — J449 Chronic obstructive pulmonary disease, unspecified: Secondary | ICD-10-CM | POA: Diagnosis not present

## 2015-06-04 NOTE — Progress Notes (Signed)
Daily Session Note  Patient Details  Name: GARETH FITZNER MRN: [id number] Date of Birth: 25-Jul-1945 Referring Provider:  [facility]  Encounter Date: 06/04/2015  Check In:     Session Check In - 06/04/15 1237    Check-In   Location [facility]-Cardiac & Pulmonary Rehab   Staff Present Nyoka Cowden, RN;Robin Petrakis, RN, [name], DPT, CEEA   Supervising physician immediately available to respond to emergencies [name] immediately available ER MD   Physician(s) Dr. Edd Fabian and Dr. Burlene Arnt   Medication changes reported     No   Fall or balance concerns reported    No   Warm-up and Cool-down Performed on first and last piece of equipment   Resistance Training Performed Yes   VAD Patient? No   Pain Assessment   Currently in Pain? No/denies         Goals Met:  Proper associated with RPD/PD & O2 Sat Exercise tolerated well  Goals Unmet:  Not Applicable  Comments:    Dr. Emily Filbert is Medical Director for [place] and [name].

## 2015-06-06 ENCOUNTER — Encounter: Payer: Self-pay | Admitting: *Deleted

## 2015-06-06 ENCOUNTER — Encounter: Payer: No Typology Code available for payment source | Admitting: *Deleted

## 2015-06-06 DIAGNOSIS — J449 Chronic obstructive pulmonary disease, unspecified: Secondary | ICD-10-CM

## 2015-06-06 NOTE — Progress Notes (Signed)
Daily Session Note  Patient Details  Name: Jeffrey Burgess MRN: [id number] Date of Birth: 04-Nov-1945 Referring Provider:    Encounter Date: 06/06/2015  Check In:     Session Check In - 06/06/15 1204    Check-In   Staff Present Nyoka Cowden, RN;Chrishauna Mee, RN, BSN, CCRP;Carroll Enterkin, RN, BSN   Supervising physician immediately available to respond to emergencies [name] immediately available ER MD   Physician(s) Drs Jacqualine Code and Joni Fears   Medication changes reported     No   Fall or balance concerns reported    No   Warm-up and Cool-down Performed on first and last piece of equipment   VAD Patient? No   Pain Assessment   Currently in Pain? No/denies         Goals Met:  Independence with exercise equipment Exercise tolerated well  Goals Unmet:  Not Applicable  Comments: Doing well with exercise prescription progression.    Dr. Emily Filbert is Medical Director for [place] and [name].

## 2015-06-09 NOTE — Progress Notes (Signed)
Pulmonary Individual Treatment Plan  Patient Details  Name: CHRISHAWN BOLEY MRN: [id number] Date of Birth: [birth year] Referring Provider:    Initial Encounter Date:       Pulmonary Rehab from 02/18/2015 in [place]   Date  02/18/15      Visit Diagnosis: Chronic obstructive pulmonary disease, unspecified COPD type ([place])  Patient's Home Medications on Admission:  Current outpatient prescriptions:  .  acetaminophen (TYLENOL) 500 MG tablet, Take 500 mg by mouth., Disp: , Rfl:  .  albuterol (PROAIR HFA) 108 (90 Base) MCG/ACT inhaler, Inhale 2 puffs into the lungs., Disp: , Rfl:  .  aspirin EC 81 MG tablet, Take 81 mg by mouth., Disp: , Rfl:  .  beclomethasone (BECONASE-AQ) 42 MCG/SPRAY nasal spray, Place 2 sprays into the nose., Disp: , Rfl:  .  budesonide-formoterol (SYMBICORT) 160-4.5 MCG/ACT inhaler, Inhale 2 puffs into the lungs., Disp: , Rfl:  .  buPROPion (WELLBUTRIN) 100 MG tablet, Take 100 mg by mouth., Disp: , Rfl:  .  busPIRone (BUSPAR) 10 MG tablet, Take 10 mg by mouth., Disp: , Rfl:  .  Cholecalciferol (VITAMIN D3 SUPER STRENGTH) 2000 units TABS, Take 2,000 Units by mouth., Disp: , Rfl:  .  docusate sodium (STOOL SOFTENER) 100 MG capsule, Take 100 mg by mouth., Disp: , Rfl:  .  fluticasone (FLONASE) 50 MCG/ACT nasal spray, Place 1 spray into the nose., Disp: , Rfl:  .  insulin aspart protamine - aspart (NOVOLOG 70/30 MIX) (70-30) 100 UNIT/ML FlexPen, Inject 18 Units into the skin., Disp: , Rfl:  .  metFORMIN (GLUCOPHAGE) 500 MG tablet, Take 500 mg by mouth., Disp: , Rfl:  .  metoprolol tartrate (LOPRESSOR) 25 MG tablet, Take 25 mg by mouth., Disp: , Rfl:  .  montelukast (SINGULAIR) 10 MG tablet, Take 10 mg by mouth., Disp: , Rfl:  .  nitroGLYCERIN (NITROSTAT) 0.4 MG SL tablet, Place 0.4 mg under the tongue., Disp: , Rfl:  .  Omega-3 1000 MG CAPS, Take 2 g by mouth., Disp: , Rfl:  .  pramipexole (MIRAPEX) 0.5 MG tablet, Take 0.5 mg by  mouth., Disp: , Rfl:  .  pregabalin (LYRICA) 100 MG capsule, Take 100 mg by mouth., Disp: , Rfl:  .  ranitidine (ZANTAC) 150 MG capsule, Take 150 mg by mouth., Disp: , Rfl:  .  rosuvastatin (CRESTOR) 20 MG tablet, Take 20 mg by mouth., Disp: , Rfl:  .  tiotropium (SPIRIVA) 18 MCG inhalation capsule, Place 18 mcg into inhaler and inhale., Disp: , Rfl:  .  VITAMIN B COMPLEX-C CAPS, Take 1 tablet by mouth., Disp: , Rfl:  .  vitamin E 400 UNIT capsule, Take 400 Units by mouth., Disp: , Rfl:   Past Medical History: Past Medical History  Diagnosis Date  . Asthma   . Rheumatoid arthritis ([place])   . Hyperlipidemia   . Hypertension   . Sleep apnea   . Myocardial infarction ([place])   . Diabetes ([place])     Tobacco Use: History  Smoking status  . Former Smoker -- 1.00 packs/day for 57 years  . Types: Cigarettes  . Quit date: 02/10/2015  Smokeless tobacco  . Former Geophysical data processor: Recent Review Flowsheet Data    There is no flowsheet data to display.         POCT Glucose      02/26/15 1130 03/05/15 1130 03/05/15 1531 03/05/15 1535     POCT Blood Glucose   Pre-Exercise 224  mg/dL  Breakfast one hour ago       Post-Exercise 193 mg/dL       Pre-Exercise #2   268 mg/dL     Post-Exercise #2   233 mg/dL --  02/28/2015    Pre-Exercise #3  248 mg/dL      Post-Exercise #3  190 mg/dL         ADL UCSD:     Pulmonary Assessment Scores      02/18/15 1340 04/16/15 1130     ADL UCSD   ADL Phase Entry Mid    SOB Score total 65 56    Rest 1 0    Walk 2 2    Stairs 4 5    Bath 2 1    Dress 2 1    Shop 4 4       Pulmonary Function Assessment:     Pulmonary Function Assessment - 02/18/15 1340    Initial Spirometry Results   FVC% 62 %   FEV1% 43 %   FEV1/FVC Ratio 55.33   Post Bronchodilator Spirometry Results   FVC% 54 %   FEV1% 62 %   FEV1/FVC Ratio 71.51   Breath   Bilateral Breath Sounds Clear;Decreased   Shortness of Breath Yes;Limiting activity;Fear of Shortness of  Breath      Exercise Target Goals:    Exercise Program Goal: Individual exercise prescription set with THRR, safety & activity barriers. Participant demonstrates ability to understand and report RPE using BORG scale, to self-measure pulse accurately, and to acknowledge the importance of the exercise prescription.  Exercise Prescription Goal: Starting with aerobic activity 30 plus minutes a day, 3 days per week for initial exercise prescription. Provide home exercise prescription and guidelines that participant acknowledges understanding prior to discharge.  Activity Barriers & Risk Stratification:     Activity Barriers & Cardiac Risk Stratification - 02/18/15 1340    Activity Barriers & Cardiac Risk Stratification   Activity Barriers Assistive Device;Deconditioning;Muscular Weakness;Balance Concerns   Cardiac Risk Stratification Moderate      6 Minute Walk:     6 Minute Walk      02/18/15 1817 04/16/15 1303     6 Minute Walk   Phase Initial Mid Program    Distance 802 feet 1000 feet    Walk Time 6 minutes 6 minutes    # of Rest Breaks  0    RPE 14 17    Perceived Dyspnea  3 3    Symptoms No No    Resting HR 88 bpm 78 bpm    Resting BP 136/72 mmHg 126/70 mmHg    Max Ex. HR 86 bpm 88 bpm    Max Ex. BP 146/72 mmHg 148/80 mmHg       Initial Exercise Prescription:     Initial Exercise Prescription - 02/18/15 1800    Date of Initial Exercise RX and Referring Provider   Date 02/18/15   Treadmill   MPH 1   Grade 0   Minutes 10   Recumbant Bike   Level 2   RPM 40   Watts 20   Minutes 10   NuStep   Level 2   Watts 40   Minutes 10   Arm Ergometer   Level 1   Watts 10   Minutes 10   Recumbant Elliptical   Level 2   RPM 40   Watts 20   Minutes 10   REL-XR   Level 2   Watts 40  Minutes 10   Biostep-RELP   Level 2   Watts 40   Minutes 10   Prescription Details   Frequency (times per week) 3   Duration Progress to 30 minutes of continuous aerobic  without signs/symptoms of physical distress   Intensity   THRR REST +  30   Ratings of Perceived Exertion 11-15   Perceived Dyspnea 2-4   Progression   Progression Continue progressive overload as per policy without signs/symptoms or physical distress.   Resistance Training   Training Prescription Yes   Weight 2   Reps 10-15      Perform Capillary Blood Glucose checks as needed.  Exercise Prescription Changes:     Exercise Prescription Changes      02/26/15 1500 02/28/15 1100 03/05/15 1200 03/10/15 1600 03/14/15 1100   Exercise Review   Progression  Yes Yes Yes Yes   Response to Exercise   Blood Pressure (Admit) 130/78 mmHg   140/70 mmHg    Blood Pressure (Exercise) 158/82 mmHg   168/88 mmHg    Blood Pressure (Exit) 120/70 mmHg   136/76 mmHg    Heart Rate (Admit) 74 bpm   85 bpm    Heart Rate (Exercise) 100 bpm   115 bpm    Heart Rate (Exit) 86 bpm   89 bpm    Oxygen Saturation (Admit) 97 %   96 %    Oxygen Saturation (Exercise) 96 %   97 %    Oxygen Saturation (Exit) 92 %   94 %    Rating of Perceived Exertion (Exercise) 12   12    Perceived Dyspnea (Exercise) 3   4    Symptoms  None None none None   Comments  Reviewed individualized exercise prescription and made increases per departmental policy. Exercise increases were discussed with the patient and they were able to perform the new work loads without issue (no signs or symptoms).  Epifanio is progressing well in the program. He is gaining strength and stamin and this is reflected in higher workloads and longer time.   Reviewed individualized exercise prescription and made increases per departmental policy. Exercise increases were discussed with the patient and they were able to perform the new work loads without issue (no signs or symptoms).    Duration  Progress to 30 minutes of continuous aerobic without signs/symptoms of physical distress Progress to 30 minutes of continuous aerobic without signs/symptoms of physical  distress Progress to 50 minutes of aerobic without signs/symptoms of physical distress Progress to 50 minutes of aerobic without signs/symptoms of physical distress   Intensity  Rest + 30 Rest + 30 THRR unchanged THRR unchanged   Progression   Progression  Continue progressive overload as per policy without signs/symptoms or physical distress. Continue progressive overload as per policy without signs/symptoms or physical distress. Continue progressive overload as per policy without signs/symptoms or physical distress. Continue progressive overload as per policy without signs/symptoms or physical distress.   Resistance Training   Training Prescription (read-only) _0    Weight (read-only) _1 Reps (read-only) 10-12 10-12 10-12 10-12 10-12   Treadmill   MPH (read-only) 1 1 1.5 1.8 2.1   Grade (read-only) 0 0 0 0 0   Minutes (read-only) _2 NuStep   Level (read-only) _3 Watts (read-only) 40 40 40 40 60   Minutes (read-only) _4 15  REL-XR   Level (read-only) _0 Watts (read-only) 40 40 55 60 70   Minutes (read-only) _1 03/19/15 1500 03/21/15 1100 03/24/15 1200 03/28/15 1200 04/16/15 1200   Exercise Review   Progression _2    Response to Exercise   Blood Pressure (Admit)    164/86 mmHg    Blood Pressure (Exercise)    136/78 mmHg    Blood Pressure (Exit)    128/68 mmHg    Heart Rate (Admit)    86 bpm    Heart Rate (Exercise)    132 bpm    Heart Rate (Exit)    113 bpm    Oxygen Saturation (Admit)    97 %    Oxygen Saturation (Exercise)    94 %    Oxygen Saturation (Exit)    94 %    Rating of Perceived Exertion (Exercise)    12    Perceived Dyspnea (Exercise)    4    Symptoms  None None None None   Comments  Jeanpierre can exercise for full class time, therefore efforts of progression will be achieved through interval training and continual challenges through higher workloads Donevan can exercise for  full class time, therefore efforts of progression will be achieved through interval training and continual challenges through higher workloads Elison is progressing well in the program. He makes changes almost daily to his exercise in order to continuously challenge himself. He has shown improvement in strength, stamina and improvement in ADLs. He is vey motivated and compliant to the program.  Adjusted TM for [employer]. He is walking at an increased pace but with no incline. He is maintaining strong workloads on all the other equipment   Duration  Progress to 30 minutes of continuous aerobic without signs/symptoms of physical distress Progress to 30 minutes of continuous aerobic without signs/symptoms of physical distress Progress to 30 minutes of continuous aerobic without signs/symptoms of physical distress Progress to 30 minutes of continuous aerobic without signs/symptoms of physical distress   Intensity  THRR unchanged THRR unchanged THRR unchanged THRR unchanged   Progression   Progression  Continue progressive overload as per policy without signs/symptoms or physical distress. Continue progressive overload as per policy without signs/symptoms or physical distress. Continue progressive overload as per policy without signs/symptoms or physical distress. Continue progressive overload as per policy without signs/symptoms or physical distress.   Resistance Training   Training Prescription (read-only) _3    Weight (read-only) _4 Reps (read-only) 10-12 10-12 10-12 10-12 10-12   Interval Training   Interval Training  Yes Yes Yes Yes   Equipment  REL-XR REL-XR REL-XR REL-XR   Comments  L3/60W for 3 min followed by L7/0W for 1 minute, repeat for 15 minutes continuously L3/60W for 3 min followed by L7/0W for 1 minute, repeat for 15 minutes continuously L3/60W for 3 min followed by L7/0W for 1 minute, repeat for 15 minutes continuously L3/60W for 3 min followed by L7/0W for 1 minute,  repeat for 15 minutes continuously   Treadmill   MPH (read-only) 2.1 2.1 2.1 2.6 2.7   Grade (read-only) 0 0 1 1 0   Minutes (read-only) _5 NuStep   Level (read-only) _6 Watts (read-only) 60 60 60 70 70   Minutes (read-only) _7 15  REL-XR   Level (read-only) _0 Watts (read-only) 70 80 80 100 100   Minutes (read-only) _1 04/18/15 1102 06/06/15 1253         Exercise Review   Progression -- Yes      Response to Exercise   Blood Pressure (Admit) 128/60 mmHg 146/76 mmHg      Blood Pressure (Exercise) 148/60 mmHg 154/68 mmHg      Blood Pressure (Exit) 124/64 mmHg 142/80 mmHg      Heart Rate (Admit) 98 bpm 84 bpm      Heart Rate (Exercise) 113 bpm 127 bpm      Heart Rate (Exit) 102 bpm 80 bpm      Oxygen Saturation (Admit) 94 % 96 %  Room air      Oxygen Saturation (Exercise) 94 % 94 %  Room air      Oxygen Saturation (Exit) 94 % 96 %  Room air      Rating of Perceived Exertion (Exercise) 11 12      Perceived Dyspnea (Exercise) 3 3      Symptoms None --  None      Comments --       Duration Progress to 30 minutes of continuous aerobic without signs/symptoms of physical distress Progress to 45 minutes of aerobic exercise without signs/symptoms of physical distress      Intensity THRR unchanged Rest + 30      Progression   Progression Continue progressive overload as per policy without signs/symptoms or physical distress. Continue to progress workloads to maintain intensity without signs/symptoms of physical distress.      Resistance Training   Training Prescription  Yes      Weight  4      Reps  10-15      Interval Training   Interval Training Yes       Equipment REL-XR REL-XR      Comments L3/60W for 3 min followed by L7/0W for 1 minute, repeat for 15 minutes continuously --  LEvel 3 , 60 watts      Treadmill   MPH  2.7      Grade  1.5      Minutes  15      NuStep   Level  8      Watts  70      Minutes  15       REL-XR   Level  3      Watts  60      Minutes  15         Exercise Comments:   Discharge Exercise Prescription (Final Exercise Prescription Changes):     Exercise Prescription Changes - 06/06/15 1253    Exercise Review   Progression Yes   Response to Exercise   Blood Pressure (Admit) 146/76 mmHg   Blood Pressure (Exercise) 154/68 mmHg   Blood Pressure (Exit) 142/80 mmHg   Heart Rate (Admit) 84 bpm   Heart Rate (Exercise) 127 bpm   Heart Rate (Exit) 80 bpm   Oxygen Saturation (Admit) 96 %  Room air   Oxygen Saturation (Exercise) 94 %  Room air   Oxygen Saturation (Exit) 96 %  Room air   Rating of Perceived Exertion (Exercise) 12   Perceived Dyspnea (Exercise) 3   Symptoms --  None   Duration Progress to 45 minutes of aerobic exercise without signs/symptoms of physical distress   Intensity Rest +  30   Progression   Progression Continue to progress workloads to maintain intensity without signs/symptoms of physical distress.   Resistance Training   Training Prescription Yes   Weight 4   Reps 10-15   Interval Training   Equipment REL-XR   Comments --  LEvel 3 , 60 watts   Treadmill   MPH 2.7   Grade 1.5   Minutes 15   NuStep   Level 8   Watts 70   Minutes 15   REL-XR   Level 3   Watts 60   Minutes 15       Nutrition:  Target Goals: Understanding of nutrition guidelines, daily intake of sodium <1535m, cholesterol <2069m calories 30% from fat and 7% or less from saturated fats, daily to have 5 or more servings of fruits and vegetables.  Biometrics:     Pre Biometrics - 02/18/15 1819    Pre Biometrics   Height 5' 9" (1.753 m)   Weight 261 lb 4.8 oz (118.525 kg)   Waist Circumference 48 inches   Hip Circumference 53.5 inches   Waist to Hip Ratio 0.9 %   BMI (Calculated) 38.7       Nutrition Therapy Plan and Nutrition Goals:     Nutrition Therapy & Goals - 02/18/15 1340    Nutrition Therapy   Diet Mr TuAkkermans interested in meeting the  dietitian. His wife does all the cooking - they like vegtables and salads. He does not eat much sweets, but he does not watch his portion sizes.      Nutrition Discharge: Rate Your Plate Scores:   Psychosocial: Target Goals: Acknowledge presence or absence of depression, maximize coping skills, provide positive support system. Participant is able to verbalize types and ability to use techniques and skills needed for reducing stress and depression.  Initial Review & Psychosocial Screening:     Initial Psych Review & Screening - 02/18/15 13WebbYes   Comments Mr TuHicksas good family support. He states he misses the activity he use to do before the COPD, but he is not depressed.   Barriers   Psychosocial barriers to participate in program There are no identifiable barriers or psychosocial needs.;The patient should benefit from training in stress management and relaxation.   Screening Interventions   Interventions Encouraged to exercise      Quality of Life Scores:     Quality of Life - 02/18/15 1340    Quality of Life Scores   Health/Function Pre 13.13 %   Socioeconomic Pre 28.44 %   Psych/Spiritual Pre 30 %   Family Pre 25.2 %   GLOBAL Pre 21.49 %      PHQ-9:     Recent Review Flowsheet Data    [facility]   Decreased Interest 0   Down, Depressed, Hopeless 0   PHQ - 2 Score 0      Psychosocial Evaluation and Intervention:   Psychosocial Re-Evaluation:     Psychosocial Re-Evaluation      03/28/15 1254 04/02/15 1310 04/11/15 1214       Psychosocial Re-Evaluation   Interventions Encouraged to attend Cardiac Rehabilitation for the exercise Encouraged to attend Cardiac Rehabilitation for the exercise      Comments LeChazaid sometimes it is hard to accept that his blood pressure and blood sugars and breathing won't get any better [place]  need to  accept.  Eveline Keto left a vm on the Pulmonary Rehab vm that he is still sick and won't be in to exercise today.  Jiles has had a lot going on the past 10 days and lost 8 lbs not feeling well.        Education: Education Goals: Education classes will be provided on a weekly basis, covering required topics. Participant will state understanding/return demonstration of topics presented.  Learning Barriers/Preferences:     Learning Barriers/Preferences - 02/18/15 1340    Learning Barriers/Preferences   Learning Barriers None   Learning Preferences Group Instruction;Individual Instruction;Pictoral;Skilled Demonstration;Written Material;Verbal Instruction;Video      Education Topics: Initial Evaluation Education: - Verbal, written and demonstration of respiratory meds, RPE/PD scales, oximetry and breathing techniques. Instruction on use of nebulizers and MDIs: cleaning and proper use, rinsing mouth with steroid doses and importance of monitoring MDI activations.          Pulmonary Rehab from 02/18/2015 in [place]   Date  02/18/15   Educator  LB   Instruction Review Code  2- meets goals/outcomes      General Nutrition Guidelines/Fats and Fiber: -Group instruction provided by verbal, written material, models and posters to present the general guidelines for heart healthy nutrition. Gives an explanation and review of dietary fats and fiber.      Pulmonary Rehab from 06/06/2015 in [facility] Cardiac and Pulmonary Rehab   Date  03/31/15   Educator  C. Lenny Pastel, RD   Instruction Review Code  2- meets goals/outcomes      Controlling Sodium/Reading Food Labels: -Group verbal and written material supporting the discussion of sodium use in heart healthy nutrition. Review and explanation with models, verbal and written materials for utilization of the food label.      Pulmonary Rehab from 06/06/2015 in [facility] Cardiac and Pulmonary Rehab   Date  04/14/15    Educator  LB   Instruction Review Code  2- meets goals/outcomes      Exercise Physiology & Risk Factors: - Group verbal and written instruction with models to review the exercise physiology of the cardiovascular system and associated critical values. Details cardiovascular disease risk factors and the goals associated with each risk factor.   Aerobic Exercise & Resistance Training: - Gives group verbal and written discussion on the health impact of inactivity. On the components of aerobic and resistive training programs and the benefits of this training and how to safely progress through these programs.      Pulmonary Rehab from 06/06/2015 in [facility] Cardiac and Pulmonary Rehab   Date  03/19/15   Educator  SWay   Instruction Review Code  2- meets goals/outcomes      Flexibility, Balance, General Exercise Guidelines: - Provides group verbal and written instruction on the benefits of flexibility and balance training programs. Provides general exercise guidelines with specific guidelines to those with heart or lung disease. Demonstration and skill practice provided.   Stress Management: - Provides group verbal and written instruction about the health risks of elevated stress, cause of high stress, and healthy ways to reduce stress.      Pulmonary Rehab from 06/06/2015 in [facility] Cardiac and Pulmonary Rehab   Date  04/16/15   Educator  [facility]   Instruction Review Code  2- meets goals/outcomes      Depression: - Provides group verbal and written instruction on the correlation between heart/lung disease and depressed mood, treatment options, and the stigmas associated with seeking  treatment.      Pulmonary Rehab from 06/06/2015 in [facility] Cardiac and Pulmonary Rehab   Date  05/28/15   Educator  Berle Mull   Instruction Review Code  2- meets goals/outcomes      Exercise & Equipment Safety: - Individual verbal instruction and demonstration of equipment use and safety with use of the equipment.       Pulmonary Rehab from 06/06/2015 in [facility] Cardiac and Pulmonary Rehab   Date  02/26/15   Educator  C. Enterkin,RN   Instruction Review Code  1- partially meets, needs review/practice      Infection Prevention: - Provides verbal and written material to individual with discussion of infection control including proper hand washing and proper equipment cleaning during exercise session.      Pulmonary Rehab from 06/06/2015 in [facility] Cardiac and Pulmonary Rehab   Date  02/26/15   Educator  C. [place]   Instruction Review Code  1- partially meets, needs review/practice      Falls Prevention: - Provides verbal and written material to individual with discussion of falls prevention and safety.      Pulmonary Rehab from 06/06/2015 in [facility] Cardiac and Pulmonary Rehab   Date  02/26/15   Educator  C. Enterkin,RN   Instruction Review Code  1- partially meets, needs review/practice      Diabetes: - Individual verbal and written instruction to review signs/symptoms of diabetes, desired ranges of glucose level fasting, after meals and with exercise. Advice that pre and post exercise glucose checks will be done for 3 sessions at entry of program.      Pulmonary Rehab from 06/06/2015 in [facility] Cardiac and Pulmonary Rehab   Date  02/26/15   Educator  C. EnterkinRN [employer] said he did not check his blood sugar before [place]   Instruction Review Code  1- partially meets, needs review/practice [Lorraine reports that he has had diabetes for about 20 years. ]      Chronic Lung Diseases: - Group verbal and written instruction to review new updates, new respiratory medications, new advancements in procedures and treatments. Provide informative websites and "800" numbers of self-education.   Lung Procedures: - Group verbal and written instruction to describe testing methods done to diagnose lung disease. Review the outcome of test results. Describe the treatment choices: Pulmonary Function Tests, ABGs and  oximetry.   Energy Conservation: - Provide group verbal and written instruction for methods to conserve energy, plan and organize activities. Instruct on pacing techniques, use of adaptive equipment and posture/positioning to relieve shortness of breath.   Triggers: - Group verbal and written instruction to review types of environmental controls: home humidity, furnaces, filters, dust mite/pet prevention, HEPA vacuums. To discuss weather changes, air quality and the benefits of nasal washing.      Pulmonary Rehab from 06/06/2015 in [facility] Cardiac and Pulmonary Rehab   Date  03/10/15   Educator  LB   Instruction Review Code  2- meets goals/outcomes      Exacerbations: - Group verbal and written instruction to provide: warning signs, infection symptoms, calling MD promptly, preventive modes, and value of vaccinations. Review: effective airway clearance, coughing and/or vibration techniques. Create an Sports administrator.   Oxygen: - Individual and group verbal and written instruction on oxygen therapy. Includes supplement oxygen, available portable oxygen systems, continuous and intermittent flow rates, oxygen safety, concentrators, and Medicare reimbursement for oxygen.   Respiratory Medications: - Group verbal and written instruction to review medications for lung disease. Drug class,  frequency, complications, importance of spacers, rinsing mouth after steroid MDI's, and proper cleaning methods for nebulizers.      Pulmonary Rehab from 02/18/2015 in [place]   Date  02/18/15   Educator  LB   Instruction Review Code  2- meets goals/outcomes      AED/CPR: - Group verbal and written instruction with the use of models to demonstrate the basic use of the AED with the basic ABC's of resuscitation.   Breathing Retraining: - Provides individuals verbal and written instruction on purpose, frequency, and proper technique of diaphragmatic breathing and  pursed-lipped breathing. Applies individual practice skills.      Pulmonary Rehab from 06/06/2015 in [facility] Cardiac and Pulmonary Rehab   Date  02/26/15   Educator  C. EnterkinRN   Instruction Review Code  1- partially meets, needs review/practice [Benno said would start coughing alot since his R nare conges]      Anatomy and Physiology of the Lungs: - Group verbal and written instruction with the use of models to provide basic lung anatomy and physiology related to function, structure and complications of lung disease.   Heart Failure: - Group verbal and written instruction on the basics of heart failure: signs/symptoms, treatments, explanation of ejection fraction, enlarged heart and cardiomyopathy.      Pulmonary Rehab from 06/06/2015 in [facility] Cardiac and Pulmonary Rehab   Date  03/14/15   Educator  Munson   Instruction Review Code  2- meets goals/outcomes      Sleep Apnea: - Individual verbal and written instruction to review Obstructive Sleep Apnea. Review of risk factors, methods for diagnosing and types of masks and machines for OSA.      Pulmonary Rehab from 02/18/2015 in [place]   Date  02/18/15   Educator  LB   Instruction Review Code  2- meets goals/outcomes      Anxiety: - Provides group, verbal and written instruction on the correlation between heart/lung disease and anxiety, treatment options, and management of anxiety.   Relaxation: - Provides group, verbal and written instruction about the benefits of relaxation for patients with heart/lung disease. Also provides patients with examples of relaxation techniques.   Knowledge Questionnaire Score:     Knowledge Questionnaire Score - 02/18/15 1340    Knowledge Questionnaire Score   Pre Score -2       Core Components/Risk Factors/Patient Goals at Admission:     Personal Goals and Risk Factors at Admission - 02/18/15 1340    Core Components/Risk Factors/Patient Goals on  Admission    Weight Management Yes   Intervention (read-only) Learn and follow the exercise and diet guidelines while in the program. Utilize the nutrition and education classes to help gain knowledge of the diet and exercise expectations in the program  Mr Hoel is interested in meeting the dietitian. His wife does all the cooking - they like vegtables and salads. He does not eat much sweets, but he does not watch his portion sizes.   Admit Weight 260 lb (117.935 kg)   Goal Weight: Short Term 200 lb (90.719 kg)   Sedentary Yes   Intervention (read-only) While in program, learn and follow the exercise prescription taught. Start at a low level workload and increase workload after able to maintain previous level for 30 minutes. Increase time before increasing intensity.  Mr Serviss wants to improve his exercise capacity. He likes to do yardwork and wood working.   Tobacco Cessation Yes  Number of packs per day 2ppd for 25yr; he quit a week ago - can not use chantix and uses no other nicotine aid.   Intervention (read-only) Utilize your health care professional team to help with smoking cessation while in the program. Your doctor can prescribe medications to aid in cessation. The program can provide information and counseling as needed.   Improve shortness of breath with ADL's Yes   Intervention (read-only) While in program, learn and follow the exercise prescription taught. Start at a low level workload and increase workload ad advised by the exercise physiologist. Increase time before increasing intensity.  Mr TPrichardwould like to have a better understanding of his shortness of breath and less fear with it.    Develop more efficient breathing techniques such as purse lipped breathing and diaphragmatic breathing; and practicing self-pacing with activity Yes   Intervention (read-only) While in program, learn and utilize the specific breathing techniques taught to you. Continue to practice and use the  techniques as needed.  Instructed on PLB and it's benefits.   Increase knowledge of respiratory medications and ability to use respiratory devices properly  Yes   Intervention (read-only) While in program, learn to administer MDI, nebulizer, and spacer properly.;Learn to take respiratory medicine as ordered.;While in program, learn to Clean MDI, nebulizers, and spacers properly.  Reviewed MDI's and gave Mr TManninga spacer.   Diabetes Yes   Goal Blood glucose control identified by blood glucose values, HgbA1C. Participant verbalizes understanding of the signs/symptoms of hyper/hypo glycemia, proper foot care and importance of medication and nutrition plan for blood glucose control.   Intervention (read-only) Provide nutrition & aerobic exercise along with prescribed medications to achieve blood glucose in normal ranges: Fasting 65-99 mg/dL   Hypertension Yes   Goal Participant will see blood pressure controlled within the values of 140/959mHg or within value directed by their physician.   Intervention (read-only) Provide nutrition & aerobic exercise along with prescribed medications to achieve BP 140/90 or less.   Understand more about Heart/Pulmonary Disease. Yes  Mr TuBezekould like to learn more about COPD and the daily management of the disease.   Intervention While in program utilize professionals for any questions, and attend the education sessions. Great websites to use are www.americanheart.org or www.lung.org for reliable information.  Reviewed Mr Hibner's MDIs: Albuterol, Spiriva Respimat, and Symbicort. Instructed on aerochamber and gave him a new chamber.      Core Components/Risk Factors/Patient Goals Review:      Goals and Risk Factor Review      02/26/15 1130 02/28/15 1301 03/05/15 1130 03/10/15 1130 03/10/15 1405   Core Components/Risk Factors/Patient Goals Review   Personal Goals Review Quit Smoking (p) Weight Management;Diabetes;Hypertension Increase knowledge of  respiratory medications and ability to use respiratory devices properly. Increase Aerobic Exercise and Physical Activity;Improve shortness of breath with ADL's;Develop more efficient breathing techniques such as purse lipped breathing and diaphragmatic breathing and practicing self-pacing with activity.;Understand more about Heart/Pulmonary Disease Quit Smoking   Weight Management (read-only)   Goals Progress/Improvement seen  (p) Yes   Yes   Comments  (p) Has gone from 50 in pants to 48 inche pants.    Continues to stay tobacco free.  States is the hardest thing he has ever done and he is determined to stay quit.   Increase Aerobic Exercise and Physical Activity (read-only)   Goals Progress/Improvement seen     Yes    Comments    Mr TuCastillatates he feels stronger  when he is walking. He was motivated today on the treadmill to see the 1 mile marker!    Quit Smoking (read-only)   Goals Progress/Improvement seen Yes  Yes     Comments Mr Cottrill has not had a cigarrette since his quit date which is now about 2 weeks. He does not use any nicotine replacement.  Mr Onnen is smoke free - no cigarrettes since his quit date.     Understand more about Heart/Pulmonary Disease (read-only)   Goals Progress/Improvement seen     Yes    Comments    Mr Senger attended the Trigger lecture today which had good tips on staying healthy with COPD by managing triggers in the home and outdoors with pollen and air pollution.    Improve shortness of breath with ADL's (read-only)   Comments    Mr Swiderski has completed 2 weeks of exercise and has not noticed an improvement in his shortness of breath. As he progresses with his exercise, I am hoping he will have some change to his breathing.     Breathing Techniques (read-only)   Comments    PLB is a hard technique for Mr Barnette. I recommended he take a breath in through his mouth instead of his nose and then blow out of his mouth with pursed lips.    Increase knowledge of  respiratory medications (read-only)   Goals Progress/Improvement seen    Yes     Comments   Mr Zufall carries his Poventil inhaler with him and did use it before [name] and after the treadmill. His exercise goal did increase to 86mns and 1.528m.     Diabetes (read-only)   Goal  (p) Blood glucose control identified by blood glucose values, HgbA1C. Participant verbalizes understanding of the signs/symptoms of hyper/hypo glycemia, proper foot care and importance of medication and nutrition plan for blood glucose control.        03/19/15 1502 03/24/15 1205 03/28/15 1253 04/01/15 0651 04/11/15 1215   Weight Management (read-only)   Goals Progress/Improvement seen     Yes   Comments     LeGraysenas lost 8lbs just not feeling like eating the past 10 days has seen MD and antibiotic changed etc.    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  LeGurfatehas been making impressive improvments on all the machines during his exercise time. He can exercise continuously for the entire 55 minutes allotted for aerobic exercise. For the rest of his time in the program we will continue to manipulate his intensity in order to keep him moving forward with progression.     Quit Smoking (read-only)   Goals Progress/Improvement seen Yes    Yes   Comments No cigarrettes since his quit date.       Understand more about Heart/Pulmonary Disease (read-only)   Goals Progress/Improvement seen      Yes   Comments     LeDemaniaid he has recently had more problems with his lungs and MD increased his steroids.    Improve shortness of breath with ADL's (read-only)   Goals Progress/Improvement seen   Yes   Yes   Comments  Feels like his SOB is steadily improving, however, no large gains noted.   LeJakaiaid he can tell he hasn't been  able to  exerise the past 10 days.   Breathing Techniques (read-only)   Goals Progress/Improvement seen   Yes      Comments  Wiatt has been attempting to use correct  breathing techniques, however, does forget sometimes and notices when SOB increases.  Is trying to work on this daily.      Increase knowledge of respiratory medications (read-only)   Goals Progress/Improvement seen      Yes   Comments     Phillippe said he is still on his 3 inhalers and has no questions.    Diabetes (read-only)   Progress seen towards goals   Yes  Yes   Comments   Brantley  reports his blood sugar 3 days ago 3 hours after a meal was good at 138.   Blood sugar in the 200 range today since his steroids were increased.    Hypertension (read-only)   Progress seen toward goals   Yes  Yes   Comments   Johm reports the MD recently increased his or changed his medicine for his blood  pressure. Systolic in 403'K upon arrival.   Blood pressure is very stable and good.      05/30/15 1311           Core Components/Risk Factors/Patient Goals Review   Personal Goals Review Develop more efficient breathing techniques such as purse lipped breathing and diaphragmatic breathing and practicing self-pacing with activity.       Review Reviewed PLB while exercising       Expected Outcomes This will help relieve SOB while exercising.          Core Components/Risk Factors/Patient Goals at Discharge (Final Review):      Goals and Risk Factor Review - 05/30/15 1311    Core Components/Risk Factors/Patient Goals Review   Personal Goals Review Develop more efficient breathing techniques such as purse lipped breathing and diaphragmatic breathing and practicing self-pacing with activity.   Review Reviewed PLB while exercising   Expected Outcomes This will help relieve SOB while exercising.      ITP Comments:     ITP Comments      02/26/15 1130 02/28/15 1149 03/07/15 1241 03/10/15 1301 03/14/15 1141   ITP Comments Chronic pain level "6";  unchanged with exercise Personal and exercise goals expected to be met in 33 more sessions. Progress on specific individualized goals will be charted in patient's ITP. Upon completion of the program the patient will be comfortable managing exercise goals and progression on their own.  Personal and exercise goals expected to be met in 31 more sessions. Progress on specific individualized goals will be charted in patient's ITP. Upon completion of the program the patient will be comfortable managing exercise goals and progression on their own.  Personal and Exercsie goals expeted to be met in 30   more sessions. Progress on specific goals will be charted in the ITP.  Personal and exercise goals expected to be met in 29 more sessions. Progress on specific individualized goals will be charted in patient's ITP. Upon completion of the program the patient will be comfortable managing exercise goals and progression on their own.      03/21/15 1150 03/21/15 1320 03/24/15 1219 03/26/15 1254 03/28/15 1230   ITP Comments Personal and exercise goals expected to be met in 26 more sessions. Progress on specific individualized goals will be charted in patient's ITP. Upon completion of the program the patient will be comfortable managing exercise goals and progression on their  own.  Mr. Nestor has a cut on his left pointer finger from a knife.  It is swollen and red, but has no puss.  He is going to the [place] today to have it looked at. Mr. Heslin went to the [place]. He reported they gave him a tetanus shot and cleaned it out. He also reported they gave him 10 days of antibiotics. We discussed for him to possibly eat some yougart while he is taking antibiotics. Wallice should achieve his goals by 25 more sessions.  Jermey should complete his goals including exercise goals by session 36 and he is at session 13 today.  Personal and exercise goals expected to be met in 22 more sessions. Progress on specific individualized goals will be charted  in patient's ITP. Upon completion of the program the patient will be comfortable managing exercise goals and progression on their own.      03/28/15 1246 03/28/15 1255 03/31/15 1214 03/31/15 1246 04/02/15 1308   ITP Comments Simeon Craft states he checks his blood sugars every few days and it was 138 three hours after a meal. Steadman reports they recently increased his blood pressure medicine but his systolic is still in the 628'B and he has an appt in the future to follow up with his MD about that. Heather said his weight is staying about the same since he knows he likes his meat. He said he has already met with 2 registered [facility] dieticians.  Dajion said sometimes it is hard to accept that his blood pressure and blood sugars and breathing won't get any better due to [place] [place] "but it is just something I need to accept.  Halim's finger infection is doing better. Kyzen should complete his exercise goals by visit 36 or by the 36th session.  Pattrick left early since he said he fellt terrible after being up all night coughing. Angello reports he just finished 10 days of antibiotics for his finger. I advised him to contact his MD. Simeon Craft did hear education by our Registered Dietician. He did 5 minute warm up on Recumbent Elliptical on Level 3 and only a couple of minutes of level 7 then I encouraged him to leave and check with his MD.  Eveline Keto left a vm on the Pulmonary Rehab vm that he is still sick and won't be in to exercise today.      04/11/15 1213 04/11/15 1230 04/16/15 1216 04/18/15 1238 04/18/15 1335   ITP Comments Simeon Craft lost 8 lbs in the past 10 days since he said he felt sick and couldn't eat. Blood sugar today was in the 200 range. Gino reports his steroids were increased and he is on an antibioitic.  Personal and exercise goals expected to be met in 21 more sessions. Progress on specific individualized goals will be charted in patient's ITP. Upon completion of the program the patient will be comfortable  managing exercise goals and progression on their own.  Personal and exercise goals expected to be met in 19 more sessions. Progress on specific individualized goals will be charted in patient's ITP. Upon completion of the program the patient will be comfortable managing exercise goals and progression on their own.  Personal and exercise goals expected to be met in 18 more sessions. Progress on specific individualized goals will be charted in patient's ITP. Upon completion of the program the patient will be comfortable managing exercise goals and progression on their own.  Mr Minahan shortened his exercise goal on the Treadmill to  52mns - he became very sweaty and light-headed. The TM was stopped and vitals taken: 148/60 BP; 96%/117, and glucose was 175. He rested , had water, and continued exercise on the NS.     05/14/15 1130           ITP Comments Mr TPileggiwill be off for a week or two to care for his brother's wife during his brother's surgery.          Comments: 30 day review

## 2015-06-10 NOTE — Addendum Note (Signed)
Addended by: Alonza Bogus on: 06/10/2015 03:14 PM   Modules accepted: Orders

## 2015-06-10 NOTE — Progress Notes (Signed)
Pulmonary Individual Treatment Plan  Patient Details  Name: STARR ENGEL MRN: [id number] Date of Birth: May 10, 1945 [facility] Provider:  [facility]  Initial Encounter Date:       Pulmonary Rehab from 02/18/2015 in [place]   Date  02/18/15      Visit Diagnosis: Chronic obstructive pulmonary disease, unspecified COPD type ([place])  Patient's Home Medications on Admission:  Current outpatient prescriptions:    acetaminophen (TYLENOL) 500 MG tablet, Take 500 mg by mouth., Disp: , Rfl:    albuterol (PROAIR HFA) 108 (90 Base) MCG/ACT inhaler, Inhale 2 puffs into the lungs., Disp: , Rfl:    aspirin EC 81 MG tablet, Take 81 mg by mouth., Disp: , Rfl:    beclomethasone (BECONASE-AQ) 42 MCG/SPRAY nasal spray, Place 2 sprays into the nose., Disp: , Rfl:    budesonide-formoterol (SYMBICORT) 160-4.5 MCG/ACT inhaler, Inhale 2 puffs into the lungs., Disp: , Rfl:    buPROPion (WELLBUTRIN) 100 MG tablet, Take 100 mg by mouth., Disp: , Rfl:    busPIRone (BUSPAR) 10 MG tablet, Take 10 mg by mouth., Disp: , Rfl:    Cholecalciferol (VITAMIN D3 SUPER STRENGTH) 2000 units TABS, Take 2,000 Units by mouth., Disp: , Rfl:    docusate sodium (STOOL SOFTENER) 100 MG capsule, Take 100 mg by mouth., Disp: , Rfl:    fluticasone (FLONASE) 50 MCG/ACT nasal spray, Place 1 spray into the nose., Disp: , Rfl:    insulin aspart protamine - aspart (NOVOLOG 70/30 MIX) (70-30) 100 UNIT/ML FlexPen, Inject 18 Units into the skin., Disp: , Rfl:    metFORMIN (GLUCOPHAGE) 500 MG tablet, Take 500 mg by mouth., Disp: , Rfl:    metoprolol tartrate (LOPRESSOR) 25 MG tablet, Take 25 mg by mouth., Disp: , Rfl:    montelukast (SINGULAIR) 10 MG tablet, Take 10 mg by mouth., Disp: , Rfl:    nitroGLYCERIN (NITROSTAT) 0.4 MG SL tablet, Place 0.4 mg under the tongue., Disp: , Rfl:    Omega-3 1000 MG CAPS, Take 2 g by mouth., Disp: , Rfl:    pramipexole (MIRAPEX) 0.5 MG tablet, Take 0.5 mg by  mouth., Disp: , Rfl:    pregabalin (LYRICA) 100 MG capsule, Take 100 mg by mouth., Disp: , Rfl:    ranitidine (ZANTAC) 150 MG capsule, Take 150 mg by mouth., Disp: , Rfl:    rosuvastatin (CRESTOR) 20 MG tablet, Take 20 mg by mouth., Disp: , Rfl:    tiotropium (SPIRIVA) 18 MCG inhalation capsule, Place 18 mcg into inhaler and inhale., Disp: , Rfl:    VITAMIN B COMPLEX-C CAPS, Take 1 tablet by mouth., Disp: , Rfl:    vitamin E 400 UNIT capsule, Take 400 Units by mouth., Disp: , Rfl:   Past Medical History: Past Medical History  Diagnosis Date   Asthma    Rheumatoid arthritis ([place])    Hyperlipidemia    Hypertension    Sleep apnea    Myocardial infarction ([place])    Diabetes ([place])     Tobacco Use: History  Smoking status   Former Smoker -- 1.00 packs/day for 57 years   Types: Cigarettes   Quit date: 02/10/2015  Smokeless tobacco   Former Systems developer    Labs: Recent Review Flowsheet Data    There is no flowsheet data to display.         POCT Glucose      02/26/15 1130 03/05/15 1130 03/05/15 1531 03/05/15 1535     POCT Blood Glucose   Pre-Exercise 224  mg/dL  Breakfast one hour ago       Post-Exercise 193 mg/dL       Pre-Exercise #2   268 mg/dL     Post-Exercise #2   233 mg/dL --  02/28/2015    Pre-Exercise #3  248 mg/dL      Post-Exercise #3  190 mg/dL         ADL UCSD:     Pulmonary Assessment Scores      02/18/15 1340 04/16/15 1130     ADL UCSD   ADL Phase Entry Mid    SOB Score total 65 56    Rest 1 0    Walk 2 2    Stairs 4 5    Bath 2 1    Dress 2 1    Shop 4 4       Pulmonary Function Assessment:     Pulmonary Function Assessment - 02/18/15 1340    Initial Spirometry Results   FVC% 62 %   FEV1% 43 %   FEV1/FVC Ratio 55.33   Post Bronchodilator Spirometry Results   FVC% 54 %   FEV1% 62 %   FEV1/FVC Ratio 71.51   Breath   Bilateral Breath Sounds Clear;Decreased   Shortness of Breath Yes;Limiting activity;Fear of Shortness of  Breath      Exercise Target Goals:    Exercise Program Goal: Individual exercise prescription set with THRR, safety & activity barriers. Participant demonstrates ability to understand and report RPE using BORG scale, to self-measure pulse accurately, and to acknowledge the importance of the exercise prescription.  Exercise Prescription Goal: Starting with aerobic activity 30 plus minutes a day, 3 days per week for initial exercise prescription. Provide home exercise prescription and guidelines that participant acknowledges understanding prior to discharge.  Activity Barriers & Risk Stratification:     Activity Barriers & Cardiac Risk Stratification - 02/18/15 1340    Activity Barriers & Cardiac Risk Stratification   Activity Barriers Assistive Device;Deconditioning;Muscular Weakness;Balance Concerns   Cardiac Risk Stratification Moderate      6 Minute Walk:     6 Minute Walk      02/18/15 1817 04/16/15 1303     6 Minute Walk   Phase Initial Mid Program    Distance 802 feet 1000 feet    Walk Time 6 minutes 6 minutes    # of Rest Breaks  0    RPE 14 17    Perceived Dyspnea  3 3    Symptoms No No    Resting HR 88 bpm 78 bpm    Resting BP 136/72 mmHg 126/70 mmHg    Max Ex. HR 86 bpm 88 bpm    Max Ex. BP 146/72 mmHg 148/80 mmHg       Initial Exercise Prescription:     Initial Exercise Prescription - 02/18/15 1800    Date of Initial Exercise RX and Referring Provider   Date 02/18/15   Treadmill   MPH 1   Grade 0   Minutes 10   Recumbant Bike   Level 2   RPM 40   Watts 20   Minutes 10   NuStep   Level 2   Watts 40   Minutes 10   Arm Ergometer   Level 1   Watts 10   Minutes 10   Recumbant Elliptical   Level 2   RPM 40   Watts 20   Minutes 10   REL-XR   Level 2   Watts 40  Minutes 10   Biostep-RELP   Level 2   Watts 40   Minutes 10   Prescription Details   Frequency (times per week) 3   Duration Progress to 30 minutes of continuous aerobic  without signs/symptoms of physical distress   Intensity   THRR REST +  30   Ratings of Perceived Exertion 11-15   Perceived Dyspnea 2-4   Progression   Progression Continue progressive overload as per policy without signs/symptoms or physical distress.   Resistance Training   Training Prescription Yes   Weight 2   Reps 10-15      Perform Capillary Blood Glucose checks as needed.  Exercise Prescription Changes:     Exercise Prescription Changes      02/26/15 1500 02/28/15 1100 03/05/15 1200 03/10/15 1600 03/14/15 1100   Exercise Review   Progression  Yes Yes Yes Yes   Response to Exercise   Blood Pressure (Admit) 130/78 mmHg   140/70 mmHg    Blood Pressure (Exercise) 158/82 mmHg   168/88 mmHg    Blood Pressure (Exit) 120/70 mmHg   136/76 mmHg    Heart Rate (Admit) 74 bpm   85 bpm    Heart Rate (Exercise) 100 bpm   115 bpm    Heart Rate (Exit) 86 bpm   89 bpm    Oxygen Saturation (Admit) 97 %   96 %    Oxygen Saturation (Exercise) 96 %   97 %    Oxygen Saturation (Exit) 92 %   94 %    Rating of Perceived Exertion (Exercise) 12   12    Perceived Dyspnea (Exercise) 3   4    Symptoms  None None none None   Comments  Reviewed individualized exercise prescription and made increases per departmental policy. Exercise increases were discussed with the patient and they were able to perform the new work loads without issue (no signs or symptoms).  Travion is progressing well in the program. He is gaining strength and stamin and this is reflected in higher workloads and longer time.   Reviewed individualized exercise prescription and made increases per departmental policy. Exercise increases were discussed with the patient and they were able to perform the new work loads without issue (no signs or symptoms).    Duration  Progress to 30 minutes of continuous aerobic without signs/symptoms of physical distress Progress to 30 minutes of continuous aerobic without signs/symptoms of physical  distress Progress to 50 minutes of aerobic without signs/symptoms of physical distress Progress to 50 minutes of aerobic without signs/symptoms of physical distress   Intensity  Rest + 30 Rest + 30 THRR unchanged THRR unchanged   Progression   Progression  Continue progressive overload as per policy without signs/symptoms or physical distress. Continue progressive overload as per policy without signs/symptoms or physical distress. Continue progressive overload as per policy without signs/symptoms or physical distress. Continue progressive overload as per policy without signs/symptoms or physical distress.   Resistance Training   Training Prescription (read-only) Yes Yes Yes Yes Yes   Weight (read-only) 2 2 2 3 3    Reps (read-only) 10-12 10-12 10-12 10-12 10-12   Treadmill   MPH (read-only) 1 1 1.5 1.8 2.1   Grade (read-only) 0 0 0 0 0   Minutes (read-only) 10 10 15 15 15    NuStep   Level (read-only) 2 2 2 3 5    Watts (read-only) 40 40 40 40 60   Minutes (read-only) 10 15 15 15  15  REL-XR   Level (read-only) 2 2 3 3 6    Watts (read-only) 40 40 55 60 70   Minutes (read-only) 10 10 15 15 15      03/19/15 1500 03/21/15 1100 03/24/15 1200 03/28/15 1200 04/16/15 1200   Exercise Review   Progression Yes Yes Yes Yes Yes   Response to Exercise   Blood Pressure (Admit)    164/86 mmHg    Blood Pressure (Exercise)    136/78 mmHg    Blood Pressure (Exit)    128/68 mmHg    Heart Rate (Admit)    86 bpm    Heart Rate (Exercise)    132 bpm    Heart Rate (Exit)    113 bpm    Oxygen Saturation (Admit)    97 %    Oxygen Saturation (Exercise)    94 %    Oxygen Saturation (Exit)    94 %    Rating of Perceived Exertion (Exercise)    12    Perceived Dyspnea (Exercise)    4    Symptoms  None None None None   Comments  Raden can exercise for full class time, therefore efforts of progression will be achieved through interval training and continual challenges through higher workloads Larsen can exercise for  full class time, therefore efforts of progression will be achieved through interval training and continual challenges through higher workloads Dionisios is progressing well in the program. He makes changes almost daily to his exercise in order to continuously challenge himself. He has shown improvement in strength, stamina and improvement in ADLs. He is vey motivated and compliant to the program.  Adjusted TM for [employer]. He is walking at an increased pace but with no incline. He is maintaining strong workloads on all the other equipment   Duration  Progress to 30 minutes of continuous aerobic without signs/symptoms of physical distress Progress to 30 minutes of continuous aerobic without signs/symptoms of physical distress Progress to 30 minutes of continuous aerobic without signs/symptoms of physical distress Progress to 30 minutes of continuous aerobic without signs/symptoms of physical distress   Intensity  THRR unchanged THRR unchanged THRR unchanged THRR unchanged   Progression   Progression  Continue progressive overload as per policy without signs/symptoms or physical distress. Continue progressive overload as per policy without signs/symptoms or physical distress. Continue progressive overload as per policy without signs/symptoms or physical distress. Continue progressive overload as per policy without signs/symptoms or physical distress.   Resistance Training   Training Prescription (read-only) Yes Yes Yes Yes Yes   Weight (read-only) 4 4 4 4 4    Reps (read-only) 10-12 10-12 10-12 10-12 10-12   Interval Training   Interval Training  Yes Yes Yes Yes   Equipment  REL-XR REL-XR REL-XR REL-XR   Comments  L3/60W for 3 min followed by L7/0W for 1 minute, repeat for 15 minutes continuously L3/60W for 3 min followed by L7/0W for 1 minute, repeat for 15 minutes continuously L3/60W for 3 min followed by L7/0W for 1 minute, repeat for 15 minutes continuously L3/60W for 3 min followed by L7/0W for 1 minute,  repeat for 15 minutes continuously   Treadmill   MPH (read-only) 2.1 2.1 2.1 2.6 2.7   Grade (read-only) 0 0 1 1 0   Minutes (read-only) 15 15 15 15 15    NuStep   Level (read-only) 5 5 5 8 8    Watts (read-only) 60 60 60 70 70   Minutes (read-only) 15 15 15 15  15  REL-XR   Level (read-only) 6 7 7 8 8    Watts (read-only) 70 80 80 100 100   Minutes (read-only) 15 15 15 15 15      04/18/15 1102 06/06/15 1253         Exercise Review   Progression -- Yes      Response to Exercise   Blood Pressure (Admit) 128/60 mmHg 146/76 mmHg      Blood Pressure (Exercise) 148/60 mmHg 154/68 mmHg      Blood Pressure (Exit) 124/64 mmHg 142/80 mmHg      Heart Rate (Admit) 98 bpm 84 bpm      Heart Rate (Exercise) 113 bpm 127 bpm      Heart Rate (Exit) 102 bpm 80 bpm      Oxygen Saturation (Admit) 94 % 96 %  Room air      Oxygen Saturation (Exercise) 94 % 94 %  Room air      Oxygen Saturation (Exit) 94 % 96 %  Room air      Rating of Perceived Exertion (Exercise) 11 12      Perceived Dyspnea (Exercise) 3 3      Symptoms None --  None      Comments --       Duration Progress to 30 minutes of continuous aerobic without signs/symptoms of physical distress Progress to 45 minutes of aerobic exercise without signs/symptoms of physical distress      Intensity THRR unchanged Rest + 30      Progression   Progression Continue progressive overload as per policy without signs/symptoms or physical distress. Continue to progress workloads to maintain intensity without signs/symptoms of physical distress.      Resistance Training   Training Prescription  Yes      Weight  4      Reps  10-15      Interval Training   Interval Training Yes       Equipment REL-XR REL-XR      Comments L3/60W for 3 min followed by L7/0W for 1 minute, repeat for 15 minutes continuously --  LEvel 3 , 60 watts      Treadmill   MPH  2.7      Grade  1.5      Minutes  15      NuStep   Level  8      Watts  70      Minutes  15       REL-XR   Level  3      Watts  60      Minutes  15         Exercise Comments:   Discharge Exercise Prescription (Final Exercise Prescription Changes):     Exercise Prescription Changes - 06/06/15 1253    Exercise Review   Progression Yes   Response to Exercise   Blood Pressure (Admit) 146/76 mmHg   Blood Pressure (Exercise) 154/68 mmHg   Blood Pressure (Exit) 142/80 mmHg   Heart Rate (Admit) 84 bpm   Heart Rate (Exercise) 127 bpm   Heart Rate (Exit) 80 bpm   Oxygen Saturation (Admit) 96 %  Room air   Oxygen Saturation (Exercise) 94 %  Room air   Oxygen Saturation (Exit) 96 %  Room air   Rating of Perceived Exertion (Exercise) 12   Perceived Dyspnea (Exercise) 3   Symptoms --  None   Duration Progress to 45 minutes of aerobic exercise without signs/symptoms of physical distress   Intensity Rest +  30   Progression   Progression Continue to progress workloads to maintain intensity without signs/symptoms of physical distress.   Resistance Training   Training Prescription Yes   Weight 4   Reps 10-15   Interval Training   Equipment REL-XR   Comments --  LEvel 3 , 60 watts   Treadmill   MPH 2.7   Grade 1.5   Minutes 15   NuStep   Level 8   Watts 70   Minutes 15   REL-XR   Level 3   Watts 60   Minutes 15       Nutrition:  Target Goals: Understanding of nutrition guidelines, daily intake of sodium <152m, cholesterol <2028m calories 30% from fat and 7% or less from saturated fats, daily to have 5 or more servings of fruits and vegetables.  Biometrics:     Pre Biometrics - 02/18/15 1819    Pre Biometrics   Height 5' 9"  (1.753 m)   Weight 261 lb 4.8 oz (118.525 kg)   Waist Circumference 48 inches   Hip Circumference 53.5 inches   Waist to Hip Ratio 0.9 %   BMI (Calculated) 38.7       Nutrition Therapy Plan and Nutrition Goals:     Nutrition Therapy & Goals - 02/18/15 1340    Nutrition Therapy   Diet Mr TuFusselmans interested in meeting the  dietitian. His wife does all the cooking - they like vegtables and salads. He does not eat much sweets, but he does not watch his portion sizes.      Nutrition Discharge: Rate Your Plate Scores:   Psychosocial: Target Goals: Acknowledge presence or absence of depression, maximize coping skills, provide positive support system. Participant is able to verbalize types and ability to use techniques and skills needed for reducing stress and depression.  Initial Review & Psychosocial Screening:     Initial Psych Review & Screening - 02/18/15 [place]   Comments Mr TuGeradsas good family support. He states he misses the activity he use to do before the COPD, but he is not depressed.   Barriers   Psychosocial barriers to participate in program There are no identifiable barriers or psychosocial needs.;The patient should benefit from training in stress management and relaxation.   Screening Interventions   Interventions Encouraged to exercise      Quality of Life Scores:     Quality of Life - 02/18/15 1340    Quality of Life Scores   Health/Function Pre 13.13 %   Socioeconomic Pre 28.44 %   Psych/Spiritual Pre 30 %   Family Pre 25.2 %   GLOBAL Pre 21.49 %      PHQ-9:     Recent Review Flowsheet Data    [facility]   Decreased Interest 0   Down, Depressed, Hopeless 0   PHQ - 2 Score 0      Psychosocial Evaluation and Intervention:   Psychosocial Re-Evaluation:     Psychosocial Re-Evaluation      03/28/15 1254 04/02/15 1310 04/11/15 1214       Psychosocial Re-Evaluation   Interventions Encouraged to attend Cardiac Rehabilitation for the exercise Encouraged to attend Cardiac Rehabilitation for the exercise      Comments LeMarkeseaid sometimes it is hard to accept that his blood pressure and blood sugars and breathing won't get any better [place]  need to  accept.  Eveline Keto left a vm on the Pulmonary Rehab vm that he is still sick and won't be in to exercise today.  Jarell has had a lot going on the past 10 days and lost 8 lbs not feeling well.        Education: Education Goals: Education classes will be provided on a weekly basis, covering required topics. Participant will state understanding/return demonstration of topics presented.  Learning Barriers/Preferences:     Learning Barriers/Preferences - 02/18/15 1340    Learning Barriers/Preferences   Learning Barriers None   Learning Preferences Group Instruction;Individual Instruction;Pictoral;Skilled Demonstration;Written Material;Verbal Instruction;Video      Education Topics: Initial Evaluation Education: - Verbal, written and demonstration of respiratory meds, RPE/PD scales, oximetry and breathing techniques. Instruction on use of nebulizers and MDIs: cleaning and proper use, rinsing mouth with steroid doses and importance of monitoring MDI activations.          Pulmonary Rehab from 02/18/2015 in [place]   Date  02/18/15   Educator  LB   Instruction Review Code  2- meets goals/outcomes      General Nutrition Guidelines/Fats and Fiber: -Group instruction provided by verbal, written material, models and posters to present the general guidelines for heart healthy nutrition. Gives an explanation and review of dietary fats and fiber.      Pulmonary Rehab from 06/06/2015 in [facility] Cardiac and Pulmonary Rehab   Date  03/31/15   Educator  C. Lenny Pastel, RD   Instruction Review Code  2- meets goals/outcomes      Controlling Sodium/Reading Food Labels: -Group verbal and written material supporting the discussion of sodium use in heart healthy nutrition. Review and explanation with models, verbal and written materials for utilization of the food label.      Pulmonary Rehab from 06/06/2015 in [facility] Cardiac and Pulmonary Rehab   Date  04/14/15    Educator  LB   Instruction Review Code  2- meets goals/outcomes      Exercise Physiology & Risk Factors: - Group verbal and written instruction with models to review the exercise physiology of the cardiovascular system and associated critical values. Details cardiovascular disease risk factors and the goals associated with each risk factor.   Aerobic Exercise & Resistance Training: - Gives group verbal and written discussion on the health impact of inactivity. On the components of aerobic and resistive training programs and the benefits of this training and how to safely progress through these programs.      Pulmonary Rehab from 06/06/2015 in [facility] Cardiac and Pulmonary Rehab   Date  03/19/15   Educator  SWay   Instruction Review Code  2- meets goals/outcomes      Flexibility, Balance, General Exercise Guidelines: - Provides group verbal and written instruction on the benefits of flexibility and balance training programs. Provides general exercise guidelines with specific guidelines to those with heart or lung disease. Demonstration and skill practice provided.   Stress Management: - Provides group verbal and written instruction about the health risks of elevated stress, cause of high stress, and healthy ways to reduce stress.      Pulmonary Rehab from 06/06/2015 in [facility] Cardiac and Pulmonary Rehab   Date  04/16/15   Educator  [facility]   Instruction Review Code  2- meets goals/outcomes      Depression: - Provides group verbal and written instruction on the correlation between heart/lung disease and depressed mood, treatment options, and the stigmas associated with seeking  treatment.      Pulmonary Rehab from 06/06/2015 in [facility] Cardiac and Pulmonary Rehab   Date  05/28/15   Educator  Berle Mull   Instruction Review Code  2- meets goals/outcomes      Exercise & Equipment Safety: - Individual verbal instruction and demonstration of equipment use and safety with use of the equipment.       Pulmonary Rehab from 06/06/2015 in [facility] Cardiac and Pulmonary Rehab   Date  02/26/15   Educator  C. Enterkin,RN   Instruction Review Code  1- partially meets, needs review/practice      Infection Prevention: - Provides verbal and written material to individual with discussion of infection control including proper hand washing and proper equipment cleaning during exercise session.      Pulmonary Rehab from 06/06/2015 in [facility] Cardiac and Pulmonary Rehab   Date  02/26/15   Educator  C. Perry   Instruction Review Code  1- partially meets, needs review/practice      Falls Prevention: - Provides verbal and written material to individual with discussion of falls prevention and safety.      Pulmonary Rehab from 06/06/2015 in [facility] Cardiac and Pulmonary Rehab   Date  02/26/15   Educator  C. Enterkin,RN   Instruction Review Code  1- partially meets, needs review/practice      Diabetes: - Individual verbal and written instruction to review signs/symptoms of diabetes, desired ranges of glucose level fasting, after meals and with exercise. Advice that pre and post exercise glucose checks will be done for 3 sessions at entry of program.      Pulmonary Rehab from 06/06/2015 in [facility] Cardiac and Pulmonary Rehab   Date  02/26/15   Educator  C. EnterkinRN [employer] said he did not check his blood sugar before [place]   Instruction Review Code  1- partially meets, needs review/practice [Steffen reports that he has had diabetes for about 20 years. ]      Chronic Lung Diseases: - Group verbal and written instruction to review new updates, new respiratory medications, new advancements in procedures and treatments. Provide informative websites and "800" numbers of self-education.   Lung Procedures: - Group verbal and written instruction to describe testing methods done to diagnose lung disease. Review the outcome of test results. Describe the treatment choices: Pulmonary Function Tests, ABGs and  oximetry.   Energy Conservation: - Provide group verbal and written instruction for methods to conserve energy, plan and organize activities. Instruct on pacing techniques, use of adaptive equipment and posture/positioning to relieve shortness of breath.   Triggers: - Group verbal and written instruction to review types of environmental controls: home humidity, furnaces, filters, dust mite/pet prevention, HEPA vacuums. To discuss weather changes, air quality and the benefits of nasal washing.      Pulmonary Rehab from 06/06/2015 in [facility] Cardiac and Pulmonary Rehab   Date  03/10/15   Educator  LB   Instruction Review Code  2- meets goals/outcomes      Exacerbations: - Group verbal and written instruction to provide: warning signs, infection symptoms, calling MD promptly, preventive modes, and value of vaccinations. Review: effective airway clearance, coughing and/or vibration techniques. Create an Sports administrator.   Oxygen: - Individual and group verbal and written instruction on oxygen therapy. Includes supplement oxygen, available portable oxygen systems, continuous and intermittent flow rates, oxygen safety, concentrators, and Medicare reimbursement for oxygen.   Respiratory Medications: - Group verbal and written instruction to review medications for lung disease. Drug class,  frequency, complications, importance of spacers, rinsing mouth after steroid MDI's, and proper cleaning methods for nebulizers.      Pulmonary Rehab from 02/18/2015 in [place]   Date  02/18/15   Educator  LB   Instruction Review Code  2- meets goals/outcomes      AED/CPR: - Group verbal and written instruction with the use of models to demonstrate the basic use of the AED with the basic ABC's of resuscitation.   Breathing Retraining: - Provides individuals verbal and written instruction on purpose, frequency, and proper technique of diaphragmatic breathing and  pursed-lipped breathing. Applies individual practice skills.      Pulmonary Rehab from 06/06/2015 in [facility] Card[facility] Pul[facility]   Date  02/26/15   Educator  C. EnterkinRN   Instruction Review Code  1- partially meets, needs review/practice [Bonifacio said would start coughing alot since his R nare conges]      Anatomy and Physiology of the Lungs: - Group verbal and written instruction with the use of models to provide basic lung anatomy and physiology related to function, structure and complications of lung disease.   Heart Failure: - Group verbal and written instruction on the basics of heart failure: signs/symptoms, treatments, explanation of ejection fraction, enlarged heart and cardiomyopathy.      Pulmonary Rehab from 06/06/2015 in [facility] Cardiac and Pulmonary Rehab   Date  03/14/15   Educator  [place]   Instruction Review Code  2- meets goals/outcomes      Sleep Apnea: - Individual verbal and written instruction to review Obstructive Sleep Apnea. Review of risk factors, methods for diagnosing and types of masks and machines for OSA.      Pulmonary Rehab from 02/18/2015 in [place]   Date  02/18/15   Educator  LB   Instruction Review Code  2- meets goals/outcomes      Anxiety: - Provides group, verbal and written instruction on the correlation between heart/lung disease and anxiety, treatment options, and management of anxiety.   Relaxation: - Provides group, verbal and written instruction about the benefits of relaxation for patients with heart/lung disease. Also provides patients with examples of relaxation techniques.   Knowledge Questionnaire Score:     Knowledge Questionnaire Score - 02/18/15 1340    Knowledge Questionnaire Score   Pre Score -2       Core Components/Risk Factors/Patient Goals at Admission:     Personal Goals and Risk Factors at Admission - 02/18/15 1340    Core Components/Risk Factors/Patient Goals on  Admission    Weight Management Yes   Intervention (read-only) Learn and follow the exercise and diet guidelines while in the program. Utilize the nutrition and education classes to help gain knowledge of the diet and exercise expectations in the program  Mr Urwin is interested in meeting the dietitian. His wife does all the cooking - they like vegtables and salads. He does not eat much sweets, but he does not watch his portion sizes.   Admit Weight 260 lb (117.935 kg)   Goal Weight: Short Term 200 lb (90.719 kg)   Sedentary Yes   Intervention (read-only) While in program, learn and follow the exercise prescription taught. Start at a low level workload and increase workload after able to maintain previous level for 30 minutes. Increase time before increasing intensity.  Mr Betzold wants to improve his exercise capacity. He likes to do yardwork and wood working.   Tobacco Cessation Yes  Number of packs per day 2ppd for 48yr; he quit a week ago - can not use chantix and uses no other nicotine aid.   Intervention (read-only) Utilize your health care professional team to help with smoking cessation while in the program. Your doctor can prescribe medications to aid in cessation. The program can provide information and counseling as needed.   Improve shortness of breath with ADL's Yes   Intervention (read-only) While in program, learn and follow the exercise prescription taught. Start at a low level workload and increase workload ad advised by the exercise physiologist. Increase time before increasing intensity.  Mr TJeffrieswould like to have a better understanding of his shortness of breath and less fear with it.    Develop more efficient breathing techniques such as purse lipped breathing and diaphragmatic breathing; and practicing self-pacing with activity Yes   Intervention (read-only) While in program, learn and utilize the specific breathing techniques taught to you. Continue to practice and use the  techniques as needed.  Instructed on PLB and it's benefits.   Increase knowledge of respiratory medications and ability to use respiratory devices properly  Yes   Intervention (read-only) While in program, learn to administer MDI, nebulizer, and spacer properly.;Learn to take respiratory medicine as ordered.;While in program, learn to Clean MDI, nebulizers, and spacers properly.  Reviewed MDI's and gave Mr TVianaa spacer.   Diabetes Yes   Goal Blood glucose control identified by blood glucose values, HgbA1C. Participant verbalizes understanding of the signs/symptoms of hyper/hypo glycemia, proper foot care and importance of medication and nutrition plan for blood glucose control.   Intervention (read-only) Provide nutrition & aerobic exercise along with prescribed medications to achieve blood glucose in normal ranges: Fasting 65-99 mg/dL   Hypertension Yes   Goal Participant will see blood pressure controlled within the values of 140/970mHg or within value directed by their physician.   Intervention (read-only) Provide nutrition & aerobic exercise along with prescribed medications to achieve BP 140/90 or less.   Understand more about Heart/Pulmonary Disease. Yes  Mr TuWachsmuthould like to learn more about COPD and the daily management of the disease.   Intervention While in program utilize professionals for any questions, and attend the education sessions. Great websites to use are www.americanheart.org or www.lung.org for reliable information.  Reviewed Mr Ekholm's MDIs: Albuterol, Spiriva Respimat, and Symbicort. Instructed on aerochamber and gave him a new chamber.      Core Components/Risk Factors/Patient Goals Review:      Goals and Risk Factor Review      02/26/15 1130 02/28/15 1301 03/05/15 1130 03/10/15 1130 03/10/15 1405   Core Components/Risk Factors/Patient Goals Review   Personal Goals Review Quit Smoking (p) Weight Management;Diabetes;Hypertension Increase knowledge of  respiratory medications and ability to use respiratory devices properly. Increase Aerobic Exercise and Physical Activity;Improve shortness of breath with ADL's;Develop more efficient breathing techniques such as purse lipped breathing and diaphragmatic breathing and practicing self-pacing with activity.;Understand more about Heart/Pulmonary Disease Quit Smoking   Weight Management (read-only)   Goals Progress/Improvement seen  (p) Yes   Yes   Comments  (p) Has gone from 50 in pants to 48 inche pants.    Continues to stay tobacco free.  States is the hardest thing he has ever done and he is determined to stay quit.   Increase Aerobic Exercise and Physical Activity (read-only)   Goals Progress/Improvement seen     Yes    Comments    Mr TuInghamtates he feels stronger  when he is walking. He was motivated today on the treadmill to see the 1 mile marker!    Quit Smoking (read-only)   Goals Progress/Improvement seen Yes  Yes     Comments Mr Guedes has not had a cigarrette since his quit date which is now about 2 weeks. He does not use any nicotine replacement.  Mr Macke is smoke free - no cigarrettes since his quit date.     Understand more about Heart/Pulmonary Disease (read-only)   Goals Progress/Improvement seen     Yes    Comments    Mr Terrien attended the Trigger lecture today which had good tips on staying healthy with COPD by managing triggers in the home and outdoors with pollen and air pollution.    Improve shortness of breath with ADL's (read-only)   Comments    Mr Isenhower has completed 2 weeks of exercise and has not noticed an improvement in his shortness of breath. As he progresses with his exercise, I am hoping he will have some change to his breathing.     Breathing Techniques (read-only)   Comments    PLB is a hard technique for Mr Starling. I recommended he take a breath in through his mouth instead of his nose and then blow out of his mouth with pursed lips.    Increase knowledge of  respiratory medications (read-only)   Goals Progress/Improvement seen    Yes     Comments   Mr Lewinski carries his Poventil inhaler with him and did use it before [name] and after the treadmill. His exercise goal did increase to 71mns and 1.589m.     Diabetes (read-only)   Goal  (p) Blood glucose control identified by blood glucose values, HgbA1C. Participant verbalizes understanding of the signs/symptoms of hyper/hypo glycemia, proper foot care and importance of medication and nutrition plan for blood glucose control.        03/19/15 1502 03/24/15 1205 03/28/15 1253 04/01/15 0651 04/11/15 1215   Weight Management (read-only)   Goals Progress/Improvement seen     Yes   Comments     LeDoroteoas lost 8lbs just not feeling like eating the past 10 days has seen MD and antibiotic changed etc.    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  LeChrishunas been making impressive improvments on all the machines during his exercise time. He can exercise continuously for the entire 55 minutes allotted for aerobic exercise. For the rest of his time in the program we will continue to manipulate his intensity in order to keep him moving forward with progression.     Quit Smoking (read-only)   Goals Progress/Improvement seen Yes    Yes   Comments No cigarrettes since his quit date.       Understand more about Heart/Pulmonary Disease (read-only)   Goals Progress/Improvement seen      Yes   Comments     LeKendrewaid he has recently had more problems with his lungs and MD increased his steroids.    Improve shortness of breath with ADL's (read-only)   Goals Progress/Improvement seen   Yes   Yes   Comments  Feels like his SOB is steadily improving, however, no large gains noted.   LeElkinaid he can tell he hasn't been  able to  exerise the past 10 days.   Breathing Techniques (read-only)   Goals Progress/Improvement seen   Yes      Comments  Aland has been attempting to use correct  breathing techniques, however, does forget sometimes and notices when SOB increases.  Is trying to work on this daily.      Increase knowledge of respiratory medications (read-only)   Goals Progress/Improvement seen      Yes   Comments     Ferguson said he is still on his 3 inhalers and has no questions.    Diabetes (read-only)   Progress seen towards goals   Yes  Yes   Comments   Keivon  reports his blood sugar 3 days ago 3 hours after a meal was good at 138.   Blood sugar in the 200 range today since his steroids were increased.    Hypertension (read-only)   Progress seen toward goals   Yes  Yes   Comments   Asser reports the MD recently increased his or changed his medicine for his blood  pressure. Systolic in 749'S upon arrival.   Blood pressure is very stable and good.      05/30/15 1311           Core Components/Risk Factors/Patient Goals Review   Personal Goals Review Develop more efficient breathing techniques such as purse lipped breathing and diaphragmatic breathing and practicing self-pacing with activity.       Review Reviewed PLB while exercising       Expected Outcomes This will help relieve SOB while exercising.          Core Components/Risk Factors/Patient Goals at Discharge (Final Review):      Goals and Risk Factor Review - 05/30/15 1311    Core Components/Risk Factors/Patient Goals Review   Personal Goals Review Develop more efficient breathing techniques such as purse lipped breathing and diaphragmatic breathing and practicing self-pacing with activity.   Review Reviewed PLB while exercising   Expected Outcomes This will help relieve SOB while exercising.      ITP Comments:     ITP Comments      02/26/15 1130 02/28/15 1149 03/07/15 1241 03/10/15 1301 03/14/15 1141   ITP Comments Chronic pain level "6";  unchanged with exercise Personal and exercise goals expected to be met in 33 more sessions. Progress on specific individualized goals will be charted in patient's ITP. Upon completion of the program the patient will be comfortable managing exercise goals and progression on their own.  Personal and exercise goals expected to be met in 31 more sessions. Progress on specific individualized goals will be charted in patient's ITP. Upon completion of the program the patient will be comfortable managing exercise goals and progression on their own.  Personal and Exercsie goals expeted to be met in 30   more sessions. Progress on specific goals will be charted in the ITP.  Personal and exercise goals expected to be met in 29 more sessions. Progress on specific individualized goals will be charted in patient's ITP. Upon completion of the program the patient will be comfortable managing exercise goals and progression on their own.      03/21/15 1150 03/21/15 1320 03/24/15 1219 03/26/15 1254 03/28/15 1230   ITP Comments Personal and exercise goals expected to be met in 26 more sessions. Progress on specific individualized goals will be charted in patient's ITP. Upon completion of the program the patient will be comfortable managing exercise goals and progression on their  own.  Mr. Lenhoff has a cut on his left pointer finger from a knife.  It is swollen and red, but has no puss.  He is going to the [place] today to have it looked at. Mr. Rosenbloom went to the [place]. He reported they gave him a tetanus shot and cleaned it out. He also reported they gave him 10 days of antibiotics. We discussed for him to possibly eat some yougart while he is taking antibiotics. Alver should achieve his goals by 25 more sessions.  Keyondre should complete his goals including exercise goals by session 36 and he is at session 13 today.  Personal and exercise goals expected to be met in 22 more sessions. Progress on specific individualized goals will be charted  in patient's ITP. Upon completion of the program the patient will be comfortable managing exercise goals and progression on their own.      03/28/15 1246 03/28/15 1255 03/31/15 1214 03/31/15 1246 04/02/15 1308   ITP Comments Simeon Craft states he checks his blood sugars every few days and it was 138 three hours after a meal. Perry reports they recently increased his blood pressure medicine but his systolic is still in the 759'F and he has an appt in the future to follow up with his MD about that. Hershey said his weight is staying about the same since he knows he likes his meat. He said he has already met with 2 registered [facility] dieticians.  Raynold said sometimes it is hard to accept that his blood pressure and blood sugars and breathing won't get any better due to [place] "but it is just something I need to accept.  Bari's finger infection is doing better. Taydon should complete his exercise goals by visit 36 or by the 36th session.  Nataniel left early since he said he fellt terrible after being up all night coughing. Gibbs reports he just finished 10 days of antibiotics for his finger. I advised him to contact his MD. Simeon Craft did hear education by our Registered Dietician. He did 5 minute warm up on Recumbent Elliptical on Level 3 and only a couple of minutes of level 7 then I encouraged him to leave and check with his MD.  Eveline Keto left a vm on the Pulmonary Rehab vm that he is still sick and won't be in to exercise today.      04/11/15 1213 04/11/15 1230 04/16/15 1216 04/18/15 1238 04/18/15 1335   ITP Comments Simeon Craft lost 8 lbs in the past 10 days since he said he felt sick and couldn't eat. Blood sugar today was in the 200 range. Sylvia reports his steroids were increased and he is on an antibioitic.  Personal and exercise goals expected to be met in 21 more sessions. Progress on specific individualized goals will be charted in patient's ITP. Upon completion of the program the patient will be comfortable  managing exercise goals and progression on their own.  Personal and exercise goals expected to be met in 19 more sessions. Progress on specific individualized goals will be charted in patient's ITP. Upon completion of the program the patient will be comfortable managing exercise goals and progression on their own.  Personal and exercise goals expected to be met in 18 more sessions. Progress on specific individualized goals will be charted in patient's ITP. Upon completion of the program the patient will be comfortable managing exercise goals and progression on their own.  Mr Kaneshiro shortened his exercise goal on the Treadmill to  50mns - he became very sweaty and light-headed. The TM was stopped and vitals taken: 148/60 BP; 96%/117, and glucose was 175. He rested , had water, and continued exercise on the NS.     05/14/15 1130           ITP Comments Mr TLaverdurewill be off for a week or two to care for his brother's wife during his brother's surgery.          Comments: 30 day note review

## 2015-06-11 DIAGNOSIS — J449 Chronic obstructive pulmonary disease, unspecified: Secondary | ICD-10-CM | POA: Diagnosis not present

## 2015-06-11 NOTE — Progress Notes (Signed)
Daily Session Note  Patient Details  Name: Jeffrey Burgess MRN: [id number] Date of Birth: [birth year] Referring Provider:    Encounter Date: 06/11/2015  Check In:     Session Check In - 06/11/15 1210    Check-In   Location [facility]-Cardiac & Pulmonary Rehab   Staff Present Gerlene Burdock, RN, BSN;Laureen Owens Shark, BS, RRT, Respiratory Therapist;Blanche Gallien Brayton El, DPT, [place] physician immediately available to respond to emergencies [name] immediately available ER MD   Physician(s) Clearnce Hasten and Edd Fabian   Medication changes reported     No   Fall or balance concerns reported    No   Warm-up and Cool-down Performed on first and last piece of equipment   Resistance Training Performed Yes   VAD Patient? No           Exercise Prescription Changes - 06/11/15 1200    Exercise Review   Progression Yes   Response to Exercise   Oxygen Saturation (Admit) --  Room air   Oxygen Saturation (Exercise) --  Room air   Oxygen Saturation (Exit) --  Room air   Symptoms --  None   Duration Progress to 45 minutes of aerobic exercise without signs/symptoms of physical distress   Intensity Rest + 30   Progression   Progression Continue to progress workloads to maintain intensity without signs/symptoms of physical distress.   Resistance Training   Training Prescription Yes   Weight 4   Reps 10-15   Interval Training   Equipment REL-XR   Comments --  LEvel 3 , 60 watts   NuStep   Level 10   Watts 80   Minutes 15   REL-XR   Level 10   Watts 100   Minutes 15      Goals Met:  Independence with exercise equipment Exercise tolerated well Strength training completed today  Goals Unmet:  Not Applicable  Comments: Reviewed individualized exercise prescription and made increases per departmental policy. Exercise increases were discussed with the patient and they were able to perform the new work loads without issue (no signs or symptoms).     Dr. Emily Filbert is Medical  Director for [place] and [name].

## 2015-06-13 ENCOUNTER — Encounter: Payer: No Typology Code available for payment source | Admitting: *Deleted

## 2015-06-13 DIAGNOSIS — J449 Chronic obstructive pulmonary disease, unspecified: Secondary | ICD-10-CM | POA: Diagnosis not present

## 2015-06-13 NOTE — Progress Notes (Signed)
Daily Session Note  Patient Details  Name: Jeffrey Burgess MRN: [id number] Date of Birth: Sep 20, 1945 Referring Provider:    Encounter Date: 06/13/2015  Check In:     Session Check In - 06/13/15 1207    Check-In   Location [facility]-Cardiac & Pulmonary Re[facility]   Staff Present Heath Lark, RN, BSN, CCRP;Shontell Prosser, RN, BSN   Supervising physician immediately available to respond to emergencies [name] immediately available ER MD   Physician(s) Emergency planning/management officer and [employer]   Fall or balance concerns reported    No   Warm-up and Cool-down Performed on first and last piece of equipment   Resistance Training Performed Yes   VAD Patient? No   Pain Assessment   Currently in Pain? No/denies         Goals Met:  Proper associated with RPD/PD & O2 Sat Exercise tolerated well  Goals Unmet:  Not Applicable  Comments:     Dr. Emily Filbert is Medical Director for [place] and [name].

## 2015-06-18 DIAGNOSIS — J449 Chronic obstructive pulmonary disease, unspecified: Secondary | ICD-10-CM | POA: Diagnosis not present

## 2015-06-18 NOTE — Progress Notes (Signed)
Daily Session Note  Patient Details  Name: VARTAN KERINS MRN: [id number] Date of Birth: [birth year] Referring Provider:    Encounter Date: 06/18/2015  Check In:     Session Check In - 06/18/15 1235    Check-In   Location [facility]-Card[facility] Pulmonary Re[facility]   Staff Present Nyoka Cowden, RN;Laureen Owens Shark, BS, RRT, Respiratory Therapist;Mayumi Summerson Brayton El, DPT, [place] physician immediately available to respond to emergencies [name] immediately available ER MD   Physician(s) Cinda Quest and Joni Fears   Medication changes reported     No   Fall or balance concerns reported    No   Warm-up and Cool-down Performed on first and last piece of equipment   Resistance Training Performed Yes   VAD Patient? No         Goals Met:  Independence with exercise equipment Exercise tolerated well Strength training completed today  Goals Unmet:  Not Applicable  Comments: Patient completed exercise prescription and all exercise goals during rehab session. The exercise was tolerated well and the patient is progressing in the program.    Dr. Emily Filbert is Medical Director for [place] and [name].

## 2015-06-20 ENCOUNTER — Encounter: Payer: No Typology Code available for payment source | Admitting: Respiratory Therapy

## 2015-06-20 DIAGNOSIS — J449 Chronic obstructive pulmonary disease, unspecified: Secondary | ICD-10-CM

## 2015-06-20 NOTE — Progress Notes (Signed)
Daily Session Note  Patient Details  Name: Jeffrey Burgess MRN: [id number] Date of Birth: 1945-12-27 Referring Provider:    Encounter Date: 06/20/2015  Check In:     Session Check In - 06/20/15 1309    Check-In   Staff Present Nyoka Cowden, RN;Susanne Bice, RN, BSN, CCRP;Tallon Gertz Blanch Media, RRT, RCP, Respiratory Therapist   Supervising physician immediately available to respond to emergencies [name] immediately available ER MD   Physician(s) Dr. Edd Fabian and Dr. Jimmye Norman   Medication changes reported     No   Fall or balance concerns reported    No   Warm-up and Cool-down Performed on first and last piece of equipment   Resistance Training Performed Yes   VAD Patient? No   Pain Assessment   Currently in Pain? No/denies         Goals Met:  Proper associated with RPD/PD & O2 Sat Independence with exercise equipment Exercise tolerated well Strength training completed today  Goals Unmet:  Not Applicable  Comments:    Dr. Emily Filbert is Medical Director for [place] and [name].

## 2015-06-23 ENCOUNTER — Encounter: Payer: No Typology Code available for payment source | Attending: Family Medicine | Admitting: *Deleted

## 2015-06-23 DIAGNOSIS — J449 Chronic obstructive pulmonary disease, unspecified: Secondary | ICD-10-CM | POA: Insufficient documentation

## 2015-06-23 NOTE — Progress Notes (Signed)
Daily Session Note  Patient Details  Name: Jeffrey Burgess MRN: [id number] Date of Birth: 1945/12/04 Referring Provider:    Encounter Date: 06/23/2015  Check In:     Session Check In - 06/23/15 1226    Check-In   Location [facility] Pul[facility]   Staff Present [name], BS, RRT, Respiratory Therapist;Wendy Hoback, RN, [name], DPT, CEEA   Supervising physician immediately available to respond to emergencies [name] immediately available ER MD   Physician(s) Dr. Joni Fears and Dr. Burlene Arnt   Medication changes reported     No   Fall or balance concerns reported    No   Warm-up and Cool-down Performed on first and last piece of equipment   Resistance Training Performed Yes   VAD Patient? No   Pain Assessment   Currently in Pain? No/denies         Goals Met:  Proper associated with RPD/PD & O2 Sat Exercise tolerated well  Goals Unmet:  Not Applicable  Comments:    Dr. Emily Filbert is Medical Director for [place] and [name].

## 2015-06-25 ENCOUNTER — Encounter: Payer: No Typology Code available for payment source | Admitting: *Deleted

## 2015-06-25 DIAGNOSIS — J449 Chronic obstructive pulmonary disease, unspecified: Secondary | ICD-10-CM | POA: Diagnosis not present

## 2015-06-25 NOTE — Progress Notes (Signed)
Daily Session Note  Patient Details  Name: Jeffrey Burgess MRN: [id number] Date of Birth: [birth year] Referring Provider:    Encounter Date: 06/25/2015  Check In:     Session Check In - 06/25/15 1243    Check-In   Location [facility]   Staff Present [name], BS, RRT, Respiratory Therapist;[name], RN, [name], DPT, CEEA   Supervising physician immediately available to respond to emergencies [name] immediately available ER MD   Physician(s) Dr. Jimmye Norman and Dr. Reita Cliche   Medication changes reported     No   Fall or balance concerns reported    No   Warm-up and Cool-down Performed on first and last piece of equipment   Resistance Training Performed Yes   VAD Patient? No   Pain Assessment   Currently in Pain? No/denies         Goals Met:  Proper associated with RPD/PD & O2 Sat Exercise tolerated well  Goals Unmet:  Not Applicable  Comments:    Dr. Emily Filbert is Medical Director for [name] and [name].

## 2015-06-27 ENCOUNTER — Encounter: Payer: No Typology Code available for payment source | Admitting: *Deleted

## 2015-06-27 DIAGNOSIS — J449 Chronic obstructive pulmonary disease, unspecified: Secondary | ICD-10-CM

## 2015-06-27 NOTE — Progress Notes (Signed)
Daily Session Note  Patient Details  Name: Jeffrey Burgess MRN: [id number] Date of Birth: [birth year] Referring Provider:    Encounter Date: 06/27/2015  Check In:     Session Check In - 06/27/15 1205    Check-In   Staff Present Heath Lark, RN, BSN, CCRP;Stacey Blanch Media, RRT, RCP, Respiratory Therapist;Carroll Enterkin, RN, BSN   Supervising physician immediately available to respond to emergencies [name] immediately available ER MD   Physician(s) Drs: Edd Fabian  and Mariea Clonts   Medication changes reported     No   Fall or balance concerns reported    No   Warm-up and Cool-down Performed on first and last piece of equipment   Resistance Training Performed No   Pain Assessment   Currently in Pain? No/denies         Goals Met:  Proper associated with RPD/PD & O2 Sat Independence with exercise equipment Exercise tolerated well Strength training completed today  Goals Unmet:  Not Applicable  Comments: Doing well with exercise prescription progression.    Dr. Emily Filbert is Medical Director for [place] and [name].

## 2015-06-30 ENCOUNTER — Encounter: Payer: No Typology Code available for payment source | Admitting: Respiratory Therapy

## 2015-06-30 DIAGNOSIS — J449 Chronic obstructive pulmonary disease, unspecified: Secondary | ICD-10-CM | POA: Diagnosis not present

## 2015-06-30 NOTE — Progress Notes (Signed)
Daily Session Note ° °Patient Details  °Name: Jeffrey Burgess °MRN: [id number] °Date of Birth: 06/24/1945 °Referring Provider:  [facility] ° °Encounter Date: 06/30/2015 ° °Check In: °  °  °Session Check In - 06/30/15 1130   ° Check-In  ° Location [facility]  ° Staff Present Laureen Brown, BS, RRT, Respiratory Therapist;Diane Wright, RN, BSN;Carroll Enterkin, RN, BSN  ° Supervising physician immediately available to respond to emergencies [name] immediately available ER MD  ° Physician(s) Lord and Schaevitz  ° Medication changes reported     No  ° Fall or balance concerns reported    No  ° Warm-up and Cool-down Performed on first and last piece of equipment  ° Resistance Training Performed Yes  °  ° ° ° ° ° °Goals Met:  °Proper associated with RPD/PD & O2 Sat °Independence with exercise equipment °Improved SOB with ADL's °Using PLB without cueing & demonstrates good technique °Exercise tolerated well °Strength training completed today ° °Goals Unmet:  °Not Applicable ° °Comments: Patient completed exercise prescription and all exercise goals during rehab sessions.The exercise was tolerated well, and the patient is progressing in the program.  ° ° °Dr. Mark Miller is Medical Director for [facility] and [name]. °

## 2015-07-02 ENCOUNTER — Encounter: Payer: No Typology Code available for payment source | Admitting: *Deleted

## 2015-07-02 DIAGNOSIS — J449 Chronic obstructive pulmonary disease, unspecified: Secondary | ICD-10-CM

## 2015-07-02 NOTE — Progress Notes (Signed)
Daily Session Note  Patient Details  Name: ZYIR GASSERT MRN: [id number] Date of Birth: 1945-08-31 Referring Provider:    Encounter Date: 07/02/2015  Check In:     Session Check In - 07/02/15 1244    Check-In   Location [facility]   Staff Present [name], BS, RRT, Respiratory Therapist;Stephenson Cichy, RN, [name], DPT, CEEA   Supervising physician immediately available to respond to emergencies [name] immediately available ER MD   Physician(s) Dr. Lovena Le and Dr. Jimmye Norman   Medication changes reported     No   Fall or balance concerns reported    No   Warm-up and Cool-down Performed on first and last piece of equipment   Resistance Training Performed Yes   VAD Patient? No   Pain Assessment   Currently in Pain? No/denies         Goals Met:  Exercise tolerated well  Goals Unmet:  Not Applicable  Comments:     Dr. Emily Filbert is Medical Director for [place] and [name].

## 2015-07-04 ENCOUNTER — Encounter: Payer: No Typology Code available for payment source | Admitting: *Deleted

## 2015-07-04 DIAGNOSIS — J449 Chronic obstructive pulmonary disease, unspecified: Secondary | ICD-10-CM | POA: Diagnosis not present

## 2015-07-04 NOTE — Progress Notes (Signed)
Daily Session Note  Patient Details  Name: Jeffrey Burgess MRN: [id number] Date of Birth: 10/29/1945 Referring Provider:    Encounter Date: 07/04/2015  Check In:     Session Check In - 07/04/15 1201    Check-In   Location [facility]   Staff Present Susanne Bice, RN, BSN, CCRP;Carroll Enterkin, RN, BSN;Kelly Hayes, BS, ACSM CEP, Exercise Physiologist   Supervising physician immediately available to respond to emergencies See telemetry face sheet for immediately available ER MD   Medication changes reported     No   Fall or balance concerns reported    Yes   Comments Walks with a cane all the time but mostly his gait is steady.    Warm-up and Cool-down Performed on first and last piece of equipment   Resistance Training Performed Yes   VAD Patient? No   Pain Assessment   Currently in Pain? No/denies         Goals Met:  Proper associated with RPD/PD & O2 Sat Exercise tolerated well  Goals Unmet:  Not Applicable  Comments:    Dr. Mark Miller is Medical Director for [facility] and [name]. 

## 2015-07-07 ENCOUNTER — Encounter: Payer: No Typology Code available for payment source | Admitting: *Deleted

## 2015-07-07 DIAGNOSIS — J449 Chronic obstructive pulmonary disease, unspecified: Secondary | ICD-10-CM

## 2015-07-07 NOTE — Patient Instructions (Signed)
Discharge Instructions  Patient Details  Name: Jeffrey Burgess MRN: [id number] Date of Birth: 1945/09/28 Referring Provider:  [facility]   Number of Visits: 21 Reason for Discharge:  Patient reached a stable level of exercise. Patient independent in their exercise.  Smoking History:  History  Smoking status   Former Smoker -- 1.00 packs/day for 57 years   Types: Cigarettes   Quit date: 02/10/2015  Smokeless tobacco   Former User    Diagnosis:  Chronic obstructive pulmonary disease, unspecified COPD type (HCC)  Initial Exercise Prescription:     Initial Exercise Prescription - 02/18/15 1800    Date of Initial Exercise RX and Referring Provider   Date 02/18/15   Treadmill   MPH 1   Grade 0   Minutes 10   Recumbant Bike   Level 2   RPM 40   Watts 20   Minutes 10   NuStep   Level 2   Watts 40   Minutes 10   Arm Ergometer   Level 1   Watts 10   Minutes 10   Recumbant Elliptical   Level 2   RPM 40   Watts 20   Minutes 10   REL-XR   Level 2   Watts 40   Minutes 10   Biostep-RELP   Level 2   Watts 40   Minutes 10   Prescription Details   Frequency (times per week) 3   Duration Progress to 30 minutes of continuous aerobic without signs/symptoms of physical distress   Intensity   THRR REST +  30   Ratings of Perceived Exertion 11-15   Perceived Dyspnea 2-4   Progression   Progression Continue progressive overload as per policy without signs/symptoms or physical distress.   Resistance Training   Training Prescription Yes   Weight 2   Reps 10-15      Discharge Exercise Prescription (Final Exercise Prescription Changes):     Exercise Prescription Changes - 07/07/15 0800    Exercise Review   Progression Yes   Response to Exercise   Blood Pressure (Admit) 156/84 mmHg  Data on 07/04/2015   Blood Pressure (Exercise) 152/70 mmHg   Blood Pressure (Exit) 130/80 mmHg   Heart Rate (Admit) 78 bpm   Heart Rate (Exercise) 127 bpm    Heart Rate (Exit) 100 bpm   Oxygen Saturation (Admit) 98 %   Oxygen Saturation (Exercise) 95 %   Oxygen Saturation (Exit) 95 %   Rating of Perceived Exertion (Exercise) 13   Perceived Dyspnea (Exercise) 4   Symptoms None   Intensity Rest + 40   Progression   Progression Continue to progress workloads to maintain intensity without signs/symptoms of physical distress.   Resistance Training   Training Prescription Yes   Weight 4   Reps 10-12   Treadmill   MPH 2.9   Grade 2   Minutes 10   NuStep   Level 10   Watts 100   Minutes 15   Elliptical   Level 5   Speed 3.5   Minutes 5   REL-XR   Level 10   Watts 100   Minutes 15   Home Exercise Plan   Plans to continue exercise at Home   Frequency Add 2 additional days to program exercise sessions.  Home Treadmill, Elliptical, Biodex Bicycle      Functional Capacity:     6 Minute Walk      02/18/15 1817 04/16/15 1303 07/04/15 1202   6  Minute Walk   Phase Initial Mid Program Discharge   Distance 802 feet 1000 feet 1140 feet   Walk Time 6 minutes 6 minutes 6 minutes   # of Rest Breaks  0    RPE Perceived Dyspnea  Symptoms No No    Resting HR 88 bpm 78 bpm 78 bpm   Resting BP 136/72 mmHg 126/70 mmHg 156/84 mmHg   Max Ex. HR 86 bpm 88 bpm 90 bpm   Max Ex. BP 146/72 mmHg 148/80 mmHg 152/70 mmHg      Quality of Life:     Quality of Life - 06/27/15 1509    Quality of Life Scores   Health/Function Pre 13.13 %   Health/Function Post 20.38 %   Health/Function % Change 55.22 %   Socioeconomic Pre 28.44 %   Socioeconomic Post 29.25 %   Socioeconomic % Change  2.85 %   Psych/Spiritual Pre 30 %   Psych/Spiritual Post 30 %   Psych/Spiritual % Change 0 %   Family Pre 25.2 %   Family Post 30 %   Family % Change 19.05 %   GLOBAL Pre 21.49 %   GLOBAL Post 25.56 %   GLOBAL % Change 18.94 %      Personal Goals: Goals established at orientation with interventions provided to work toward goal.      Personal Goals and Risk Factors at Admission - 02/18/15 1340    Core Components/Risk Factors/Patient Goals on Admission    Weight Management Yes   Intervention (read-only) Learn and follow the exercise and diet guidelines while in the program. Utilize the nutrition and education classes to help gain knowledge of the diet and exercise expectations in the program  Mr Boeve is interested in meeting the dietitian. His wife does all the cooking - they like vegtables and salads. He does not eat much sweets, but he does not watch his portion sizes.   Admit Weight 260 lb (117.935 kg)   Goal Weight: Short Term 200 lb (90.719 kg)   Sedentary Yes   Intervention (read-only) While in program, learn and follow the exercise prescription taught. Start at a low level workload and increase workload after able to maintain previous level for 30 minutes. Increase time before increasing intensity.  Mr Happ wants to improve his exercise capacity. He likes to do yardwork and wood working.   Tobacco Cessation Yes   Number of packs per day 2ppd for 68yrs; he quit a week ago - can not use chantix and uses no other nicotine aid.   Intervention (read-only) Utilize your health care professional team to help with smoking cessation while in the program. Your doctor can prescribe medications to aid in cessation. The program can provide information and counseling as needed.   Improve shortness of breath with ADL's Yes   Intervention (read-only) While in program, learn and follow the exercise prescription taught. Start at a low level workload and increase workload ad advised by the exercise physiologist. Increase time before increasing intensity.  Mr Beske would like to have a better understanding of his shortness of breath and less fear with it.    Develop more efficient breathing techniques such as purse lipped breathing and diaphragmatic breathing; and practicing self-pacing with activity Yes   Intervention (read-only) While  in program, learn and utilize the specific breathing techniques taught to you. Continue to practice and use the techniques as needed.  Instructed on PLB and it's  benefits.   Increase knowledge of respiratory medications and ability to use respiratory devices properly  Yes   Intervention (read-only) While in program, learn to administer MDI, nebulizer, and spacer properly.;Learn to take respiratory medicine as ordered.;While in program, learn to Clean MDI, nebulizers, and spacers properly.  Reviewed MDI's and gave Mr Fyock a spacer.   Diabetes Yes   Goal Blood glucose control identified by blood glucose values, HgbA1C. Participant verbalizes understanding of the signs/symptoms of hyper/hypo glycemia, proper foot care and importance of medication and nutrition plan for blood glucose control.   Intervention (read-only) Provide nutrition & aerobic exercise along with prescribed medications to achieve blood glucose in normal ranges: Fasting 65-99 mg/dL   Hypertension Yes   Goal Participant will see blood pressure controlled within the values of 140/48mm/Hg or within value directed by their physician.   Intervention (read-only) Provide nutrition & aerobic exercise along with prescribed medications to achieve BP 140/90 or less.   Understand more about Heart/Pulmonary Disease. Yes  Mr Lezcano would like to learn more about COPD and the daily management of the disease.   Intervention While in program utilize professionals for any questions, and attend the education sessions. Great websites to use are www.americanheart.org or www.lung.org for reliable information.  Reviewed Mr Mckinney's MDIs: Albuterol, Spiriva Respimat, and Symbicort. Instructed on aerochamber and gave him a new chamber.       Personal Goals Discharge:     Goals and Risk Factor Review - 07/07/15 0834    Core Components/Risk Factors/Patient Goals Review   Personal Goals Review Increase Strength and Stamina;Develop more efficient  breathing techniques such as purse lipped breathing and diaphragmatic breathing and practicing self-pacing with activity.;Increase knowledge of respiratory medications and ability to use respiratory devices properly.   Review Mr Ohms graduates today from [employer]. He has improved his stamina and endurance; he is more active in his daily life. His post improved by 377ft, 42% - minimal importance difference for COPD is 98.33ft. He has gained knowledge about his MDI' and uses PLB. He plans to continue exercise at home on his home equipment  with his wife and daughter  as part of his daily management of his COPD.      Nutrition & Weight - Outcomes:     Pre Biometrics - 02/18/15 1819    Pre Biometrics   Height 5\' 9"  (1.753 m)   Weight 261 lb 4.8 oz (118.525 kg)   Waist Circumference 48 inches   Hip Circumference 53.5 inches   Waist to Hip Ratio 0.9 %   BMI (Calculated) 38.7       Nutrition:     Nutrition Therapy & Goals - 02/18/15 1340    Nutrition Therapy   Diet Mr Chhim is interested in meeting the dietitian. His wife does all the cooking - they like vegtables and salads. He does not eat much sweets, but he does not watch his portion sizes.      Nutrition Discharge:   Education Questionnaire Score:     Knowledge Questionnaire Score - 07/07/15 07/09/15    Knowledge Questionnaire Score   Post Score 10/10      Goals reviewed with patient; copy given to patient.

## 2015-07-07 NOTE — Progress Notes (Signed)
Pulmonary Individual Treatment Plan  Patient Details  Name: Jeffrey Burgess MRN: [id number] Date of Birth: [birth year] Referring Provider:  [facility]  Initial Encounter Date:       Pulmonary Rehab from 02/18/2015 in [place]   Date  02/18/15      Visit Diagnosis: Chronic obstructive pulmonary disease, unspecified COPD type ([place]) - Plan: PULMONARY REHAB 30 DAY REVIEW  Patient's Home Medications on Admission:  Current outpatient prescriptions:    acetaminophen (TYLENOL) 500 MG tablet, Take 500 mg by mouth., Disp: , Rfl:    albuterol (PROAIR HFA) 108 (90 Base) MCG/ACT inhaler, Inhale 2 puffs into the lungs., Disp: , Rfl:    aspirin EC 81 MG tablet, Take 81 mg by mouth., Disp: , Rfl:    beclomethasone (BECONASE-AQ) 42 MCG/SPRAY nasal spray, Place 2 sprays into the nose., Disp: , Rfl:    budesonide-formoterol (SYMBICORT) 160-4.5 MCG/ACT inhaler, Inhale 2 puffs into the lungs., Disp: , Rfl:    buPROPion (WELLBUTRIN) 100 MG tablet, Take 100 mg by mouth., Disp: , Rfl:    busPIRone (BUSPAR) 10 MG tablet, Take 10 mg by mouth., Disp: , Rfl:    Cholecalciferol (VITAMIN D3 SUPER STRENGTH) 2000 units TABS, Take 2,000 Units by mouth., Disp: , Rfl:    docusate sodium (STOOL SOFTENER) 100 MG capsule, Take 100 mg by mouth., Disp: , Rfl:    fluticasone (FLONASE) 50 MCG/ACT nasal spray, Place 1 spray into the nose., Disp: , Rfl:    insulin aspart protamine - aspart (NOVOLOG 70/30 MIX) (70-30) 100 UNIT/ML FlexPen, Inject 18 Units into the skin., Disp: , Rfl:    metFORMIN (GLUCOPHAGE) 500 MG tablet, Take 500 mg by mouth., Disp: , Rfl:    metoprolol tartrate (LOPRESSOR) 25 MG tablet, Take 25 mg by mouth., Disp: , Rfl:    montelukast (SINGULAIR) 10 MG tablet, Take 10 mg by mouth., Disp: , Rfl:    nitroGLYCERIN (NITROSTAT) 0.4 MG SL tablet, Place 0.4 mg under the tongue., Disp: , Rfl:    Omega-3 1000 MG CAPS, Take 2 g by mouth., Disp: , Rfl:     pramipexole (MIRAPEX) 0.5 MG tablet, Take 0.5 mg by mouth., Disp: , Rfl:    pregabalin (LYRICA) 100 MG capsule, Take 100 mg by mouth., Disp: , Rfl:    ranitidine (ZANTAC) 150 MG capsule, Take 150 mg by mouth., Disp: , Rfl:    rosuvastatin (CRESTOR) 20 MG tablet, Take 20 mg by mouth., Disp: , Rfl:    tiotropium (SPIRIVA) 18 MCG inhalation capsule, Place 18 mcg into inhaler and inhale., Disp: , Rfl:    VITAMIN B COMPLEX-C CAPS, Take 1 tablet by mouth., Disp: , Rfl:    vitamin E 400 UNIT capsule, Take 400 Units by mouth., Disp: , Rfl:   Past Medical History: Past Medical History  Diagnosis Date   Asthma    Rheumatoid arthritis ([place])    Hyperlipidemia    Hypertension    Sleep apnea    Myocardial infarction ([place])    Diabetes ([place])     Tobacco Use: History  Smoking status   Former Smoker -- 1.00 packs/day for 57 years   Types: Cigarettes   Quit date: 02/10/2015  Smokeless tobacco   Former Systems developer    Labs: Recent Review Flowsheet Data    There is no flowsheet data to display.         POCT Glucose      02/26/15 1130 03/05/15 1130 03/05/15 1531 03/05/15 1535  POCT Blood Glucose   Pre-Exercise 224 mg/dL  Breakfast one hour ago       Post-Exercise 193 mg/dL       Pre-Exercise #2   268 mg/dL     Post-Exercise #2   233 mg/dL --  02/28/2015    Pre-Exercise #3  248 mg/dL      Post-Exercise #3  190 mg/dL         ADL UCSD:     Pulmonary Assessment Scores      02/18/15 1340 04/16/15 1130 06/27/15 1247   ADL UCSD   ADL Phase Entry Mid Exit   SOB Score total 65 56 32   Rest 1 0 0   Walk _0 Stairs _1 Bath 2 1 0   Dress 2 1 0   Shop _2 07/07/15 0823       ADL UCSD   ADL Phase Mid        Pulmonary Function Assessment:     Pulmonary Function Assessment - 02/18/15 1340    Initial Spirometry Results   FVC% 62 %   FEV1% 43 %   FEV1/FVC Ratio 55.33   Post Bronchodilator Spirometry Results   FVC% 54 %   FEV1% 62 %   FEV1/FVC  Ratio 71.51   Breath   Bilateral Breath Sounds Clear;Decreased   Shortness of Breath Yes;Limiting activity;Fear of Shortness of Breath      Exercise Target Goals:    Exercise Program Goal: Individual exercise prescription set with THRR, safety & activity barriers. Participant demonstrates ability to understand and report RPE using BORG scale, to self-measure pulse accurately, and to acknowledge the importance of the exercise prescription.  Exercise Prescription Goal: Starting with aerobic activity 30 plus minutes a day, 3 days per week for initial exercise prescription. Provide home exercise prescription and guidelines that participant acknowledges understanding prior to discharge.  Activity Barriers & Risk Stratification:     Activity Barriers & Cardiac Risk Stratification - 02/18/15 1340    Activity Barriers & Cardiac Risk Stratification   Activity Barriers Assistive Device;Deconditioning;Muscular Weakness;Balance Concerns   Cardiac Risk Stratification Moderate      6 Minute Walk:     6 Minute Walk      02/18/15 1817 04/16/15 1303 07/04/15 1202   6 Minute Walk   Phase Initial Mid Program Discharge   Distance 802 feet 1000 feet 1140 feet   Walk Time 6 minutes 6 minutes 6 minutes   # of Rest Breaks  0    RPE _3 Perceived Dyspnea  _4 Symptoms No No    Resting HR 88 bpm 78 bpm 78 bpm   Resting BP 136/72 mmHg 126/70 mmHg 156/84 mmHg   Max Ex. HR 86 bpm 88 bpm 90 bpm   Max Ex. BP 146/72 mmHg 148/80 mmHg 152/70 mmHg      Initial Exercise Prescription:     Initial Exercise Prescription - 02/18/15 1800    Date of Initial Exercise RX and Referring Provider   Date 02/18/15   Treadmill   MPH 1   Grade 0   Minutes 10   Recumbant Bike   Level 2   RPM 40   Watts 20   Minutes 10   NuStep   Level 2   Watts 40   Minutes 10   Arm Ergometer   Level 1   Watts 10   Minutes  10   Recumbant Elliptical   Level 2   RPM 40   Watts 20   Minutes 10   REL-XR    Level 2   Watts 40   Minutes 10   Biostep-RELP   Level 2   Watts 40   Minutes 10   Prescription Details   Frequency (times per week) 3   Duration Progress to 30 minutes of continuous aerobic without signs/symptoms of physical distress   Intensity   THRR REST +  30   Ratings of Perceived Exertion 11-15   Perceived Dyspnea 2-4   Progression   Progression Continue progressive overload as per policy without signs/symptoms or physical distress.   Resistance Training   Training Prescription Yes   Weight 2   Reps 10-15      Perform Capillary Blood Glucose checks as needed.  Exercise Prescription Changes:     Exercise Prescription Changes      02/26/15 1500 02/28/15 1100 03/05/15 1200 03/10/15 1600 03/14/15 1100   Exercise Review   Progression  Yes Yes Yes Yes   Response to Exercise   Blood Pressure (Admit) 130/78 mmHg   140/70 mmHg    Blood Pressure (Exercise) 158/82 mmHg   168/88 mmHg    Blood Pressure (Exit) 120/70 mmHg   136/76 mmHg    Heart Rate (Admit) 74 bpm   85 bpm    Heart Rate (Exercise) 100 bpm   115 bpm    Heart Rate (Exit) 86 bpm   89 bpm    Oxygen Saturation (Admit) 97 %   96 %    Oxygen Saturation (Exercise) 96 %   97 %    Oxygen Saturation (Exit) 92 %   94 %    Rating of Perceived Exertion (Exercise) 12   12    Perceived Dyspnea (Exercise) 3   4    Symptoms  None None none None   Comments  Reviewed individualized exercise prescription and made increases per departmental policy. Exercise increases were discussed with the patient and they were able to perform the new work loads without issue (no signs or symptoms).  Rashidi is progressing well in the program. He is gaining strength and stamin and this is reflected in higher workloads and longer time.   Reviewed individualized exercise prescription and made increases per departmental policy. Exercise increases were discussed with the patient and they were able to perform the new work loads without issue (no signs  or symptoms).    Duration  Progress to 30 minutes of continuous aerobic without signs/symptoms of physical distress Progress to 30 minutes of continuous aerobic without signs/symptoms of physical distress Progress to 50 minutes of aerobic without signs/symptoms of physical distress Progress to 50 minutes of aerobic without signs/symptoms of physical distress   Intensity  Rest + 30 Rest + 30 THRR unchanged THRR unchanged   Progression   Progression  Continue progressive overload as per policy without signs/symptoms or physical distress. Continue progressive overload as per policy without signs/symptoms or physical distress. Continue progressive overload as per policy without signs/symptoms or physical distress. Continue progressive overload as per policy without signs/symptoms or physical distress.   Resistance Training   Training Prescription (read-only) _0    Weight (read-only) _1 Reps (read-only) 10-12 10-12 10-12 10-12 10-12   Treadmill   MPH (read-only) 1 1 1.5 1.8 2.1   Grade (read-only) 0 0 0 0 0   Minutes (read-only) 10 10 15  15 15   NuStep   Level (read-only) _0 Watts (read-only) 40 40 40 40 60   Minutes (read-only) _1 REL-XR   Level (read-only) _2 Watts (read-only) 40 40 55 60 70   Minutes (read-only) _3 03/19/15 1500 03/21/15 1100 03/24/15 1200 03/28/15 1200 04/16/15 1200   Exercise Review   Progression _4    Response to Exercise   Blood Pressure (Admit)    164/86 mmHg    Blood Pressure (Exercise)    136/78 mmHg    Blood Pressure (Exit)    128/68 mmHg    Heart Rate (Admit)    86 bpm    Heart Rate (Exercise)    132 bpm    Heart Rate (Exit)    113 bpm    Oxygen Saturation (Admit)    97 %    Oxygen Saturation (Exercise)    94 %    Oxygen Saturation (Exit)    94 %    Rating of Perceived Exertion (Exercise)    12    Perceived Dyspnea (Exercise)    4    Symptoms  None None None None    Comments  Daryan can exercise for full class time, therefore efforts of progression will be achieved through interval training and continual challenges through higher workloads Sumeet can exercise for full class time, therefore efforts of progression will be achieved through interval training and continual challenges through higher workloads Philip is progressing well in the program. He makes changes almost daily to his exercise in order to continuously challenge himself. He has shown improvement in strength, stamina and improvement in ADLs. He is vey motivated and compliant to the program.  Adjusted TM for [employer]. He is walking at an increased pace but with no incline. He is maintaining strong workloads on all the other equipment   Duration  Progress to 30 minutes of continuous aerobic without signs/symptoms of physical distress Progress to 30 minutes of continuous aerobic without signs/symptoms of physical distress Progress to 30 minutes of continuous aerobic without signs/symptoms of physical distress Progress to 30 minutes of continuous aerobic without signs/symptoms of physical distress   Intensity  THRR unchanged THRR unchanged THRR unchanged THRR unchanged   Progression   Progression  Continue progressive overload as per policy without signs/symptoms or physical distress. Continue progressive overload as per policy without signs/symptoms or physical distress. Continue progressive overload as per policy without signs/symptoms or physical distress. Continue progressive overload as per policy without signs/symptoms or physical distress.   Resistance Training   Training Prescription (read-only) _5    Weight (read-only) _6 Reps (read-only) 10-12 10-12 10-12 10-12 10-12   Interval Training   Interval Training  Yes Yes Yes Yes   Equipment  REL-XR REL-XR REL-XR REL-XR   Comments  L3/60W for 3 min followed by L7/0W for 1 minute, repeat for 15 minutes continuously L3/60W for 3 min  followed by L7/0W for 1 minute, repeat for 15 minutes continuously L3/60W for 3 min followed by L7/0W for 1 minute, repeat for 15 minutes continuously L3/60W for 3 min followed by L7/0W for 1 minute, repeat for 15 minutes continuously   Treadmill   MPH (read-only) 2.1 2.1 2.1 2.6 2.7   Grade (read-only) 0 0 1 1 0   Minutes (read-only) 15 15  _0 NuStep   Level (read-only) _1 Watts (read-only) 60 60 60 70 70   Minutes (read-only) _2 REL-XR   Level (read-only) _3 Watts (read-only) 70 80 80 100 100   Minutes (read-only) _4 04/18/15 1102 06/06/15 1253 06/11/15 1200 06/20/15 1300 06/23/15 1500   Exercise Review   Progression -- Yes Yes  Yes   Response to Exercise   Blood Pressure (Admit) 128/60 mmHg 146/76 mmHg   152/80 mmHg   Blood Pressure (Exercise) 148/60 mmHg 154/68 mmHg   170/70 mmHg   Blood Pressure (Exit) 124/64 mmHg 142/80 mmHg   132/74 mmHg   Heart Rate (Admit) 98 bpm 84 bpm   87 bpm   Heart Rate (Exercise) 113 bpm 127 bpm   129 bpm   Heart Rate (Exit) 102 bpm 80 bpm   102 bpm   Oxygen Saturation (Admit) 94 % 96 %  Room air --  Room air  97 %   Oxygen Saturation (Exercise) 94 % 94 %  Room air --  Room air  93 %   Oxygen Saturation (Exit) 94 % 96 %  Room air --  Room air  94 %   Rating of Perceived Exertion (Exercise) _5 Perceived Dyspnea (Exercise) _6 Symptoms None --  None --  None  None   Comments --    Started the EL goal due to EL at home.   Duration Progress to 30 minutes of continuous aerobic without signs/symptoms of physical distress Progress to 45 minutes of aerobic exercise without signs/symptoms of physical distress Progress to 45 minutes of aerobic exercise without signs/symptoms of physical distress  Progress to 50 minutes of aerobic without signs/symptoms of physical distress   Intensity THRR unchanged Rest + 30 Rest + 30  Rest + 40   Progression   Progression Continue progressive  overload as per policy without signs/symptoms or physical distress. Continue to progress workloads to maintain intensity without signs/symptoms of physical distress. Continue to progress workloads to maintain intensity without signs/symptoms of physical distress. Continue to progress workloads to maintain intensity without signs/symptoms of physical distress. Continue to progress workloads to maintain intensity without signs/symptoms of physical distress.   Resistance Training   Training Prescription  Yes Yes  Yes   Weight  _7 Reps  10-15 10-15  10-12   Interval Training   Interval Training Yes       Equipment REL-XR REL-XR REL-XR     Comments L3/60W for 3 min followed by L7/0W for 1 minute, repeat for 15 minutes continuously --  LEvel 3 , 60 watts --  LEvel 3 , 60 watts     Treadmill   MPH  2.7   2.7   Grade  1.5   1.5   Minutes  15   10   NuStep   Level  _8 Watts  70 80 100 100   Minutes  _9 Elliptical   Level     5   Speed     3.4   Minutes     5   REL-XR   Level  _10 Watts  60 100  100   Minutes  15  15  15     06/30/15 1500 07/07/15 0800         Exercise Review   Progression Yes Yes      Response to Exercise   Blood Pressure (Admit)  156/84 mmHg  Data on 07/04/2015      Blood Pressure (Exercise)  152/70 mmHg      Blood Pressure (Exit)  130/80 mmHg      Heart Rate (Admit)  78 bpm      Heart Rate (Exercise)  127 bpm      Heart Rate (Exit)  100 bpm      Oxygen Saturation (Admit)  98 %      Oxygen Saturation (Exercise)  95 %      Oxygen Saturation (Exit)  95 %      Rating of Perceived Exertion (Exercise)  13      Perceived Dyspnea (Exercise)  4      Symptoms None None      Duration Progress to 50 minutes of aerobic without signs/symptoms of physical distress       Intensity Rest + 40 Rest + 40      Progression   Progression Continue to progress workloads to maintain intensity without signs/symptoms of physical distress. Continue to  progress workloads to maintain intensity without signs/symptoms of physical distress.      Resistance Training   Training Prescription Yes Yes      Weight 4 4      Reps 10-12 10-12      Treadmill   MPH 2.7 2.9      Grade 2 2      Minutes 10 10      NuStep   Level 10 10      Watts 100 100      Minutes 15 15      Elliptical   Level 5 5      Speed 3.5 3.5      Minutes 5 5      REL-XR   Level 10 10      Watts 100 100      Minutes 15 15      Home Exercise Plan   Plans to continue exercise at  Home      Frequency  Add 2 additional days to program exercise sessions.  Home Treadmill, Elliptical, Biodex Bicycle         Exercise Comments:     Exercise Comments      07/04/15 1233           Exercise Comments [place] does NOT have Silver Cassell Clement will exercise at home since he has a treadmill, an elliptical.           Discharge Exercise Prescription (Final Exercise Prescription Changes):     Exercise Prescription Changes - 07/07/15 0800    Exercise Review   Progression Yes   Response to Exercise   Blood Pressure (Admit) 156/84 mmHg  Data on 07/04/2015   Blood Pressure (Exercise) 152/70 mmHg   Blood Pressure (Exit) 130/80 mmHg   Heart Rate (Admit) 78 bpm   Heart Rate (Exercise) 127 bpm   Heart Rate (Exit) 100 bpm   Oxygen Saturation (Admit) 98 %   Oxygen Saturation (Exercise) 95 %   Oxygen Saturation (Exit) 95 %   Rating of Perceived Exertion (Exercise) 13   Perceived Dyspnea (Exercise) 4   Symptoms None   Intensity Rest + 40   Progression   Progression Continue to progress workloads to maintain intensity  without signs/symptoms of physical distress.   Resistance Training   Training Prescription Yes   Weight 4   Reps 10-12   Treadmill   MPH 2.9   Grade 2   Minutes 10   NuStep   Level 10   Watts 100   Minutes 15   Elliptical   Level 5   Speed 3.5   Minutes 5   REL-XR   Level 10   Watts 100   Minutes 15   Home Exercise Plan   Plans to continue  exercise at Home   Frequency Add 2 additional days to program exercise sessions.  Home Treadmill, Elliptical, Biodex Bicycle       Nutrition:  Target Goals: Understanding of nutrition guidelines, daily intake of sodium <1537m, cholesterol <2058m calories 30% from fat and 7% or less from saturated fats, daily to have 5 or more servings of fruits and vegetables.  Biometrics:     Pre Biometrics - 02/18/15 1819    Pre Biometrics   Height 5' 9" (1.753 m)   Weight 261 lb 4.8 oz (118.525 kg)   Waist Circumference 48 inches   Hip Circumference 53.5 inches   Waist to Hip Ratio 0.9 %   BMI (Calculated) 38.7       Nutrition Therapy Plan and Nutrition Goals:     Nutrition Therapy & Goals - 02/18/15 1340    Nutrition Therapy   Diet Mr TuHollisters interested in meeting the dietitian. His wife does all the cooking - they like vegtables and salads. He does not eat much sweets, but he does not watch his portion sizes.      Nutrition Discharge: Rate Your Plate Scores:   Psychosocial: Target Goals: Acknowledge presence or absence of depression, maximize coping skills, provide positive support system. Participant is able to verbalize types and ability to use techniques and skills needed for reducing stress and depression.  Initial Review & Psychosocial Screening:     Initial Psych Review & Screening - 02/18/15 13[place]   Comments Mr TuMoragneas good family support. He states he misses the activity he use to do before the COPD, but he is not depressed.   Barriers   Psychosocial barriers to participate in program There are no identifiable barriers or psychosocial needs.;The patient should benefit from training in stress management and relaxation.   Screening Interventions   Interventions Encouraged to exercise      Quality of Life Scores:     Quality of Life - 06/27/15 1509    Quality of Life Scores   Health/Function Pre 13.13 %    Health/Function Post 20.38 %   Health/Function % Change 55.22 %   Socioeconomic Pre 28.44 %   Socioeconomic Post 29.25 %   Socioeconomic % Change  2.85 %   Psych/Spiritual Pre 30 %   Psych/Spiritual Post 30 %   Psych/Spiritual % Change 0 %   Family Pre 25.2 %   Family Post 30 %   Family % Change 19.05 %   GLOBAL Pre 21.49 %   GLOBAL Post 25.56 %   GLOBAL % Change 18.94 %      PHQ-9:     Recent Review Flowsheet Data    [facility] 07/04/2015 06/27/2015 02/18/2015   Decreased Interest 0 0 0   Down, Depressed, Hopeless 0 0 0   PHQ - 2 Score 0 0 0   Altered sleeping 0 0 -  Tired, decreased energy 1 1 -   Change in appetite 0 0 -   Feeling bad or failure about yourself  1 1 -   Trouble concentrating 0 0 -   Moving slowly or fidgety/restless 0 0 -   Suicidal thoughts 0 0 -   PHQ-9 Score 2 2 -   Difficult doing work/chores Not difficult at all Not difficult at all -      Psychosocial Evaluation and Intervention:     Psychosocial Evaluation - 06/11/15 1307    Psychosocial Evaluation & Interventions   Comments Counselor met with Mr. Chait today for psychosocial evaluation.  He is a 70 year old who has COPD and diabetes.  He has a strong support system with a spouse of 8 years and (2) Adult children who live close by, as well as active involvement in his local church.  Mr. Caspers has contended with sleep apnea for years and states the CPAP has helped.  He denies a history of depression or anxiety, or current symptoms - but takes medications that are meant to slow down his heart which can also treat depression/anxiety symptoms.  He reports typically being in a positive mood and other than his health he has minimal stress in his life.  Mr. Miles has goals to complete this program and be stronger & get back to activities he enjoys.  [place] more there and for longer periods of time.  He already has seen an improvement since being  here with increased energy and stamina and has lost 10-15 pounds.  Mr. Mcbain has a treadmill and other gym equipment at his home that he plans to use following completion of this program.        Psychosocial Re-Evaluation:     Psychosocial Re-Evaluation      03/28/15 1254 04/02/15 1310 04/11/15 1214 06/25/15 1157     Psychosocial Re-Evaluation   Interventions Encouraged to attend Cardiac Rehabilitation for the exercise Encouraged to attend Cardiac Rehabilitation for the exercise      Comments Kaylin said sometimes it is hard to accept that his blood pressure and blood sugars and breathing won't get any better due to [employer] [employer] [place] "but it is just something I need to accept.  Eveline Keto left a vm on the Pulmonary Rehab vm that he is still sick and won't be in to exercise today.  Kaeson has had a lot going on the past 10 days and lost 8 lbs not feeling well.  Follow up with Mr. Potvin stating he is experiencing multiple benefits from this program.  He is feeling stronger and more steady and is able to return to more normal activities he hasn't done in awhile.  Mr. Scalzo states he is doing yard work and gardening and even helping remodel a home for his daughter.  He states he is sleeping okay and his mood has remained stable since coming into this program.  Counselor commended Mr. Chagnon for all of his hard work and progress made.        Education: Education Goals: Education classes will be provided on a weekly basis, covering required topics. Participant will state understanding/return demonstration of topics presented.  Learning Barriers/Preferences:     Learning Barriers/Preferences - 02/18/15 1340    Learning Barriers/Preferences   Learning Barriers None   Learning Preferences Group Instruction;Individual Instruction;Pictoral;Skilled Demonstration;Written Material;Verbal Instruction;Video      Education Topics: Initial Evaluation Education: - Verbal, written  and  demonstration of respiratory meds, RPE/PD scales, oximetry and breathing techniques. Instruction on use of nebulizers and MDIs: cleaning and proper use, rinsing mouth with steroid doses and importance of monitoring MDI activations.          Pulmonary Rehab from 07/02/2015 in [facility] Cardiac and Pulmonary Rehab   Date  06/30/15   Educator  LB   Instruction Review Code  2- meets goals/outcomes      General Nutrition Guidelines/Fats and Fiber: -Group instruction provided by verbal, written material, models and posters to present the general guidelines for heart healthy nutrition. Gives an explanation and review of dietary fats and fiber.      Pulmonary Rehab from 07/02/2015 in [facility] Cardiac and Pulmonary Rehab   Date  03/31/15   Educator  C. Lenny Pastel, RD   Instruction Review Code  2- meets goals/outcomes      Controlling Sodium/Reading Food Labels: -Group verbal and written material supporting the discussion of sodium use in heart healthy nutrition. Review and explanation with models, verbal and written materials for utilization of the food label.      Pulmonary Rehab from 07/02/2015 in [facility] Cardiac and Pulmonary Rehab   Date  04/14/15   Educator  LB   Instruction Review Code  2- meets goals/outcomes      Exercise Physiology & Risk Factors: - Group verbal and written instruction with models to review the exercise physiology of the cardiovascular system and associated critical values. Details cardiovascular disease risk factors and the goals associated with each risk factor.   Aerobic Exercise & Resistance Training: - Gives group verbal and written discussion on the health impact of inactivity. On the components of aerobic and resistive training programs and the benefits of this training and how to safely progress through these programs.      Pulmonary Rehab from 07/02/2015 in [facility] Cardiac and Pulmonary Rehab   Date  03/19/15   Educator  SWay   Instruction Review Code  2- meets goals/outcomes       Flexibility, Balance, General Exercise Guidelines: - Provides group verbal and written instruction on the benefits of flexibility and balance training programs. Provides general exercise guidelines with specific guidelines to those with heart or lung disease. Demonstration and skill practice provided.      Pulmonary Rehab from 07/02/2015 in [facility] Cardiac and Pulmonary Rehab   Date  07/02/15   Educator  Salley Hews, PT   Instruction Review Code  2- meets goals/outcomes      Stress Management: - Provides group verbal and written instruction about the health risks of elevated stress, cause of high stress, and healthy ways to reduce stress.      Pulmonary Rehab from 07/02/2015 in [facility] Cardiac and Pulmonary Rehab   Date  04/16/15   Educator  [facility]   Instruction Review Code  2- meets goals/outcomes      Depression: - Provides group verbal and written instruction on the correlation between heart/lung disease and depressed mood, treatment options, and the stigmas associated with seeking treatment.      Pulmonary Rehab from 07/02/2015 in [facility] Cardiac and Pulmonary Rehab   Date  05/28/15   Educator  Berle Mull   Instruction Review Code  2- meets goals/outcomes      Exercise & Equipment Safety: - Individual verbal instruction and demonstration of equipment use and safety with use of the equipment.      Pulmonary Rehab from 07/02/2015 in [facility] Cardiac and Pulmonary Rehab   Date  02/26/15  Educator  C. Enterkin,RN   Instruction Review Code  1- partially meets, needs review/practice      Infection Prevention: - Provides verbal and written material to individual with discussion of infection control including proper hand washing and proper equipment cleaning during exercise session.      Pulmonary Rehab from 07/02/2015 in [facility] Cardiac and Pulmonary Rehab   Date  02/26/15   Educator  C. [place]   Instruction Review Code  1- partially meets, needs review/practice      Falls  Prevention: - Provides verbal and written material to individual with discussion of falls prevention and safety.      Pulmonary Rehab from 07/02/2015 in [facility] Cardiac and Pulmonary Rehab   Date  02/26/15   Educator  C. Enterkin,RN   Instruction Review Code  1- partially meets, needs review/practice      Diabetes: - Individual verbal and written instruction to review signs/symptoms of diabetes, desired ranges of glucose level fasting, after meals and with exercise. Advice that pre and post exercise glucose checks will be done for 3 sessions at entry of program.      Pulmonary Rehab from 07/02/2015 in [facility] Cardiac and Pulmonary Rehab   Date  02/26/15   Educator  C. EnterkinRN [employer] said he did not check his blood sugar before [place]   Instruction Review Code  1- partially meets, needs review/practice [Celestino reports that he has had diabetes for about 20 years. ]      Chronic Lung Diseases: - Group verbal and written instruction to review new updates, new respiratory medications, new advancements in procedures and treatments. Provide informative websites and "800" numbers of self-education.   Lung Procedures: - Group verbal and written instruction to describe testing methods done to diagnose lung disease. Review the outcome of test results. Describe the treatment choices: Pulmonary Function Tests, ABGs and oximetry.   Energy Conservation: - Provide group verbal and written instruction for methods to conserve energy, plan and organize activities. Instruct on pacing techniques, use of adaptive equipment and posture/positioning to relieve shortness of breath.   Triggers: - Group verbal and written instruction to review types of environmental controls: home humidity, furnaces, filters, dust mite/pet prevention, HEPA vacuums. To discuss weather changes, air quality and the benefits of nasal washing.      Pulmonary Rehab from 07/02/2015 in [facility] Cardiac and Pulmonary Rehab   Date  03/10/15    Educator  LB   Instruction Review Code  2- meets goals/outcomes      Exacerbations: - Group verbal and written instruction to provide: warning signs, infection symptoms, calling MD promptly, preventive modes, and value of vaccinations. Review: effective airway clearance, coughing and/or vibration techniques. Create an Sports administrator.   Oxygen: - Individual and group verbal and written instruction on oxygen therapy. Includes supplement oxygen, available portable oxygen systems, continuous and intermittent flow rates, oxygen safety, concentrators, and Medicare reimbursement for oxygen.   Respiratory Medications: - Group verbal and written instruction to review medications for lung disease. Drug class, frequency, complications, importance of spacers, rinsing mouth after steroid MDI's, and proper cleaning methods for nebulizers.      Pulmonary Rehab from 02/18/2015 in [place]   Date  02/18/15   Educator  LB   Instruction Review Code  2- meets goals/outcomes      AED/CPR: - Group verbal and written instruction with the use of models to demonstrate the basic use of the AED with the basic ABC's of resuscitation.  Breathing Retraining: - Provides individuals verbal and written instruction on purpose, frequency, and proper technique of diaphragmatic breathing and pursed-lipped breathing. Applies individual practice skills.      Pulmonary Rehab from 07/02/2015 in [facility] Cardiac and Pulmonary Rehab   Date  02/26/15   Educator  C. EnterkinRN   Instruction Review Code  1- partially meets, needs review/practice [Pellegrino said would start coughing alot since his R nare conges]      Anatomy and Physiology of the Lungs: - Group verbal and written instruction with the use of models to provide basic lung anatomy and physiology related to function, structure and complications of lung disease.   Heart Failure: - Group verbal and written instruction on the basics of  heart failure: signs/symptoms, treatments, explanation of ejection fraction, enlarged heart and cardiomyopathy.      Pulmonary Rehab from 07/02/2015 in [facility] Cardiac and Pulmonary Rehab   Date  06/20/15   Educator  [place]   Instruction Review Code  2- meets goals/outcomes      Sleep Apnea: - Individual verbal and written instruction to review Obstructive Sleep Apnea. Review of risk factors, methods for diagnosing and types of masks and machines for OSA.      Pulmonary Rehab from 02/18/2015 in [place]   Date  02/18/15   Educator  LB   Instruction Review Code  2- meets goals/outcomes      Anxiety: - Provides group, verbal and written instruction on the correlation between heart/lung disease and anxiety, treatment options, and management of anxiety.   Relaxation: - Provides group, verbal and written instruction about the benefits of relaxation for patients with heart/lung disease. Also provides patients with examples of relaxation techniques.   Knowledge Questionnaire Score:     Knowledge Questionnaire Score - 07/07/15 5830    Knowledge Questionnaire Score   Post Score 10/10       Core Components/Risk Factors/Patient Goals at Admission:     Personal Goals and Risk Factors at Admission - 02/18/15 1340    Core Components/Risk Factors/Patient Goals on Admission    Weight Management Yes   Intervention (read-only) Learn and follow the exercise and diet guidelines while in the program. Utilize the nutrition and education classes to help gain knowledge of the diet and exercise expectations in the program  Mr Mario is interested in meeting the dietitian. His wife does all the cooking - they like vegtables and salads. He does not eat much sweets, but he does not watch his portion sizes.   Admit Weight 260 lb (117.935 kg)   Goal Weight: Short Term 200 lb (90.719 kg)   Sedentary Yes   Intervention (read-only) While in program, learn and follow the  exercise prescription taught. Start at a low level workload and increase workload after able to maintain previous level for 30 minutes. Increase time before increasing intensity.  Mr Mcconville wants to improve his exercise capacity. He likes to do yardwork and wood working.   Tobacco Cessation Yes   Number of packs per day 2ppd for 10yr; he quit a week ago - can not use chantix and uses no other nicotine aid.   Intervention (read-only) Utilize your health care professional team to help with smoking cessation while in the program. Your doctor can prescribe medications to aid in cessation. The program can provide information and counseling as needed.   Improve shortness of breath with ADL's Yes   Intervention (read-only) While in program, learn and follow the exercise prescription taught.  Start at a low level workload and increase workload ad advised by the exercise physiologist. Increase time before increasing intensity.  Mr Cohen would like to have a better understanding of his shortness of breath and less fear with it.    Develop more efficient breathing techniques such as purse lipped breathing and diaphragmatic breathing; and practicing self-pacing with activity Yes   Intervention (read-only) While in program, learn and utilize the specific breathing techniques taught to you. Continue to practice and use the techniques as needed.  Instructed on PLB and it's benefits.   Increase knowledge of respiratory medications and ability to use respiratory devices properly  Yes   Intervention (read-only) While in program, learn to administer MDI, nebulizer, and spacer properly.;Learn to take respiratory medicine as ordered.;While in program, learn to Clean MDI, nebulizers, and spacers properly.  Reviewed MDI's and gave Mr Klemens a spacer.   Diabetes Yes   Goal Blood glucose control identified by blood glucose values, HgbA1C. Participant verbalizes understanding of the signs/symptoms of hyper/hypo glycemia,  proper foot care and importance of medication and nutrition plan for blood glucose control.   Intervention (read-only) Provide nutrition & aerobic exercise along with prescribed medications to achieve blood glucose in normal ranges: Fasting 65-99 mg/dL   Hypertension Yes   Goal Participant will see blood pressure controlled within the values of 140/58m/Hg or within value directed by their physician.   Intervention (read-only) Provide nutrition & aerobic exercise along with prescribed medications to achieve BP 140/90 or less.   Understand more about Heart/Pulmonary Disease. Yes  Mr TEspinozawould like to learn more about COPD and the daily management of the disease.   Intervention While in program utilize professionals for any questions, and attend the education sessions. Great websites to use are www.americanheart.org or www.lung.org for reliable information.  Reviewed Mr Shilling's MDIs: Albuterol, Spiriva Respimat, and Symbicort. Instructed on aerochamber and gave him a new chamber.      Core Components/Risk Factors/Patient Goals Review:      Goals and Risk Factor Review      02/26/15 1130 02/28/15 1301 03/05/15 1130 03/10/15 1130 03/10/15 1405   Core Components/Risk Factors/Patient Goals Review   Personal Goals Review Quit Smoking (p) Weight Management;Diabetes;Hypertension Increase knowledge of respiratory medications and ability to use respiratory devices properly. Increase Aerobic Exercise and Physical Activity;Improve shortness of breath with ADL's;Develop more efficient breathing techniques such as purse lipped breathing and diaphragmatic breathing and practicing self-pacing with activity.;Understand more about Heart/Pulmonary Disease Quit Smoking   Weight Management (read-only)   Goals Progress/Improvement seen  (p) Yes   Yes   Comments  (p) Has gone from 50 in pants to 48 inche pants.    Continues to stay tobacco free.  States is the hardest thing he has ever done and he is determined to  stay quit.   Increase Aerobic Exercise and Physical Activity (read-only)   Goals Progress/Improvement seen     Yes    Comments    Mr TSpiewakstates he feels stronger when he is walking. He was motivated today on the treadmill to see the 1 mile marker!    Quit Smoking (read-only)   Goals Progress/Improvement seen Yes  Yes     Comments Mr TWarzechahas not had a cigarrette since his quit date which is now about 2 weeks. He does not use any nicotine replacement.  Mr TCaseresis smoke free - no cigarrettes since his quit date.     Understand more about Heart/Pulmonary Disease (read-only)  Goals Progress/Improvement seen     Yes    Comments    Mr Laviolette attended the Trigger lecture today which had good tips on staying healthy with COPD by managing triggers in the home and outdoors with pollen and air pollution.    Improve shortness of breath with ADL's (read-only)   Comments    Mr Bott has completed 2 weeks of exercise and has not noticed an improvement in his shortness of breath. As he progresses with his exercise, I am hoping he will have some change to his breathing.     Breathing Techniques (read-only)   Comments    PLB is a hard technique for Mr Studstill. I recommended he take a breath in through his mouth instead of his nose and then blow out of his mouth with pursed lips.    Increase knowledge of respiratory medications (read-only)   Goals Progress/Improvement seen    Yes     Comments   Mr Cordial carries his Poventil inhaler with him and did use it before [name] and after the treadmill. His exercise goal did increase to 63mns and 1.558m.     Diabetes (read-only)   Goal  (p) Blood glucose control identified by blood glucose values, HgbA1C. Participant verbalizes understanding of the signs/symptoms of hyper/hypo glycemia, proper foot care and importance of medication and nutrition plan for blood glucose control.        03/19/15 1502 03/24/15 1205 03/28/15 1253 04/01/15 0651 04/11/15 1215   Weight  Management (read-only)   Goals Progress/Improvement seen     Yes   Comments     LeAmanas lost 8lbs just not feeling like eating the past 10 days has seen MD and antibiotic changed etc.    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  LeDraysenas been making impressive improvments on all the machines during his exercise time. He can exercise continuously for the entire 55 minutes allotted for aerobic exercise. For the rest of his time in the program we will continue to manipulate his intensity in order to keep him moving forward with progression.     Quit Smoking (read-only)   Goals Progress/Improvement seen Yes    Yes   Comments No cigarrettes since his quit date.       Understand more about Heart/Pulmonary Disease (read-only)   Goals Progress/Improvement seen      Yes   Comments     LeSimpsonaid he has recently had more problems with his lungs and MD increased his steroids.    Improve shortness of breath with ADL's (read-only)   Goals Progress/Improvement seen   Yes   Yes   Comments  Feels like his SOB is steadily improving, however, no large gains noted.   LeRajaid he can tell he hasn't been able to exerise the past 10 days.   Breathing Techniques (read-only)   Goals Progress/Improvement seen   Yes      Comments  LeDanariusas been attempting to use correct  breathing techniques, however, does forget sometimes and notices when SOB increases.  Is trying to work on this daily.      Increase knowledge of respiratory medications (read-only)   Goals Progress/Improvement seen      Yes   Comments     LeAndruaid  he is still on his 3 inhalers and has no questions.    Diabetes (read-only)   Progress seen towards goals   Yes  Yes   Comments   Tydarius  reports his blood sugar 3 days ago 3 hours after a meal was good  at 138.   Blood sugar in the 200 range today since his steroids were increased.    Hypertension (read-only)   Progress seen toward goals   Yes  Yes   Comments   Kooper reports the MD recently increased his or changed his medicine for his blood  pressure. Systolic in 161'W upon arrival.   Blood pressure is very stable and good.      05/30/15 1311 06/23/15 1130 07/07/15 0834       Core Components/Risk Factors/Patient Goals Review   Personal Goals Review Develop more efficient breathing techniques such as purse lipped breathing and diaphragmatic breathing and practicing self-pacing with activity. Sedentary;Increase Strength and Stamina;Improve shortness of breath with ADL's;Develop more efficient breathing techniques such as purse lipped breathing and diaphragmatic breathing and practicing self-pacing with activity.;Increase knowledge of respiratory medications and ability to use respiratory devices properly. Increase Strength and Stamina;Develop more efficient breathing techniques such as purse lipped breathing and diaphragmatic breathing and practicing self-pacing with activity.;Increase knowledge of respiratory medications and ability to use respiratory devices properly.     Review Reviewed PLB while exercising Mr Rawl has made improvements in is stamina and strength. 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 At home, he has an Elliptical, treadmill, and biodes bike which he plans to continuee as his exercise plan for 2-3 days/week . His daughter and wife plan toexercise with him. In [place] today, we started Mr Werner on the EL and will continue it until he graduates. He has a good understanding of his MDI's and uses PLB independently with activity. is exercise on Mr Belmontes graduates today from [employer]. He has improved his stamina and endurance; he is more active in his daily life. His post 44md improved by 3328f 42% -  minimal importance difference for COPD is 98.38f138fHe has gained knowledge about his MDI' and uses PLB. He plans to continue exercise at home on his home equipment  with his wife and daughter  as part of his daily management of his COPD.     Expected Outcomes This will help relieve SOB while exercising. Continue exercising after graduation and continue managing his COPD daily          Core Components/Risk Factors/Patient Goals at Discharge (Final Review):      Goals and Risk Factor Review - 07/07/15 0834    Core Components/Risk Factors/Patient Goals Review   Personal Goals Review Increase Strength and Stamina;Develop more efficient breathing techniques such as purse lipped breathing and diaphragmatic breathing and practicing self-pacing with activity.;Increase knowledge of respiratory medications and ability to use respiratory devices properly.   Review Mr TucHousandaduates today from [employer] has improved his stamina and endurance; he is more active in his daily life. His post [id number] by 338ft2f% - minimal importance difference for COPD is 98.38ft. 47fhas gained knowledge about his MDI' and uses PLB. He plans to continue exercise at home on his home equipment  with his wife and daughter  as part of his daily management of his COPD.      ITP Comments:     ITP Comments  02/26/15 1130 02/28/15 1149 03/07/15 1241 03/10/15 1301 03/14/15 1141   ITP Comments Chronic pain level "6"; unchanged with exercise Personal and exercise goals expected to be met in 33 more sessions. Progress on specific individualized goals will be charted in patient's ITP. Upon completion of the program the patient will be comfortable managing exercise goals and progression on their own.  Personal and exercise goals expected to be met in 31 more sessions. Progress on specific individualized goals will be charted in patient's ITP. Upon completion of the program the patient will be comfortable managing exercise goals  and progression on their own.  Personal and Exercsie goals expeted to be met in 30   more sessions. Progress on specific goals will be charted in the ITP.  Personal and exercise goals expected to be met in 29 more sessions. Progress on specific individualized goals will be charted in patient's ITP. Upon completion of the program the patient will be comfortable managing exercise goals and progression on their own.      03/21/15 1150 03/21/15 1320 03/24/15 1219 03/26/15 1254 03/28/15 1230   ITP Comments Personal and exercise goals expected to be met in 26 more sessions. Progress on specific individualized goals will be charted in patient's ITP. Upon completion of the program the patient will be comfortable managing exercise goals and progression on their own.  Mr. Schoolfield has a cut on his left pointer finger from a knife.  It is swollen and red, but has no puss.  He is going to the [place] today to have it looked at. Mr. Kelley went to the [place]. He reported they gave him a tetanus shot and cleaned it out. He also reported they gave him 10 days of antibiotics. We discussed for him to possibly eat some yougart while he is taking antibiotics. Savaughn should achieve his goals by 25 more sessions.  Lipa should complete his goals including exercise goals by session 36 and he is at session 13 today.  Personal and exercise goals expected to be met in 22 more sessions. Progress on specific individualized goals will be charted in patient's ITP. Upon completion of the program the patient will be comfortable managing exercise goals and progression on their own.      03/28/15 1246 03/28/15 1255 03/31/15 1214 03/31/15 1246 04/02/15 1308   ITP Comments Simeon Craft states he checks his blood sugars every few days and it was 138 three hours after a meal. Leelynn reports they recently increased his blood pressure medicine but his systolic is still in the 811'B and he has an appt in the future to follow up with his MD about that. Jenson said his  weight is staying about the same since he knows he likes his meat. He said he has already met with 2 registered [facility] dieticians.  Jp said sometimes it is hard to accept that his blood pressure and blood sugars and breathing won't get any better due to [place] "but it is just something I need to accept.  Aven's finger infection is doing better. Anthony should complete his exercise goals by visit 36 or by the 36th session.  Akiel left early since he said he fellt terrible after being up all night coughing. Major reports he just finished 10 days of antibiotics for his finger. I advised him to contact his MD. Simeon Craft did hear education by our Registered Dietician. He did 5 minute warm up on Recumbent Elliptical on Level 3 and only a couple of minutes of  level 7 then I encouraged him to leave and check with his MD.  Eveline Keto left a vm on the Pulmonary Rehab vm that he is still sick and won't be in to exercise today.      04/11/15 1213 04/11/15 1230 04/16/15 1216 04/18/15 1238 04/18/15 1335   ITP Comments Simeon Craft lost 8 lbs in the past 10 days since he said he felt sick and couldn't eat. Blood sugar today was in the 200 range. Hakan reports his steroids were increased and he is on an antibioitic.  Personal and exercise goals expected to be met in 21 more sessions. Progress on specific individualized goals will be charted in patient's ITP. Upon completion of the program the patient will be comfortable managing exercise goals and progression on their own.  Personal and exercise goals expected to be met in 19 more sessions. Progress on specific individualized goals will be charted in patient's ITP. Upon completion of the program the patient will be comfortable managing exercise goals and progression on their own.  Personal and exercise goals expected to be met in 18 more sessions. Progress on specific individualized goals will be charted in patient's ITP. Upon completion of the program the patient will be  comfortable managing exercise goals and progression on their own.  Mr Housand shortened his exercise goal on the Treadmill to 55mns - he became very sweaty and light-headed. The TM was stopped and vitals taken: 148/60 BP; 96%/117, and glucose was 175. He rested , had water, and continued exercise on the NS.     05/14/15 1130           ITP Comments Mr TGillwill be off for a week or two to care for his brother's wife during his brother's surgery.          Comments: Mr TCriadograduated from [employer]15/2017 and plans to exercise at home on his exercise equipment.

## 2015-07-07 NOTE — Progress Notes (Signed)
Daily Session Note  Patient Details  Name: DRU PRIMEAU MRN: [id number] Date of Birth: 10-Aug-1945 Referring Provider:    Encounter Date: 07/07/2015  Check In:     Session Check In - 07/07/15 1242    Check-In   Location [facility]   Staff Present Gerlene Burdock, RN, BSN;Laureen Owens Shark, BS, RRT, Respiratory Therapist;Kelly Amedeo Plenty, BS, ACSM CEP, Exercise Physiologist   Supervising physician immediately available to respond to emergencies [name] immediately available ER MD   Physician(s) Dr. Reita Cliche and Dr. Corky Downs   Medication changes reported     No   Fall or balance concerns reported    No   Resistance Training Performed Yes   VAD Patient? No   Pain Assessment   Currently in Pain? No/denies           Exercise Prescription Changes - 07/07/15 0800    Exercise Review   Progression Yes   Response to Exercise   Blood Pressure (Admit) 156/84 mmHg  Data on 07/04/2015   Blood Pressure (Exercise) 152/70 mmHg   Blood Pressure (Exit) 130/80 mmHg   Heart Rate (Admit) 78 bpm   Heart Rate (Exercise) 127 bpm   Heart Rate (Exit) 100 bpm   Oxygen Saturation (Admit) 98 %   Oxygen Saturation (Exercise) 95 %   Oxygen Saturation (Exit) 95 %   Rating of Perceived Exertion (Exercise) 13   Perceived Dyspnea (Exercise) 4   Symptoms None   Intensity Rest + 40   Progression   Progression Continue to progress workloads to maintain intensity without signs/symptoms of physical distress.   Resistance Training   Training Prescription Yes   Weight 4   Reps 10-12   Treadmill   MPH 2.9   Grade 2   Minutes 10   NuStep   Level 10   Watts 100   Minutes 15   Elliptical   Level 5   Speed 3.5   Minutes 5   REL-XR   Level 10   Watts 100   Minutes 15   Home Exercise Plan   Plans to continue exercise at Home   Frequency Add 2 additional days to program exercise sessions.  Home Treadmill, Elliptical, Biodex Bicycle      Goals Met:  Proper associated with RPD/PD & O2  Sat Exercise tolerated well  Goals Unmet:  Not Applicable  Comments:     Dr. Emily Filbert is Medical Director for [place] and [name].

## 2015-07-07 NOTE — Progress Notes (Signed)
Pulmonary Individual Treatment Plan  Patient Details  Name: Jeffrey Burgess MRN: [id number] Date of Birth: [birth year] Referring Provider:    Initial Encounter Date:       Pulmonary Rehab from 02/18/2015 in [place]   Date  02/18/15      Visit Diagnosis: Chronic obstructive pulmonary disease, unspecified COPD type ([place])  Patient's Home Medications on Admission:  Current outpatient prescriptions:  .  acetaminophen (TYLENOL) 500 MG tablet, Take 500 mg by mouth., Disp: , Rfl:  .  albuterol (PROAIR HFA) 108 (90 Base) MCG/ACT inhaler, Inhale 2 puffs into the lungs., Disp: , Rfl:  .  aspirin EC 81 MG tablet, Take 81 mg by mouth., Disp: , Rfl:  .  beclomethasone (BECONASE-AQ) 42 MCG/SPRAY nasal spray, Place 2 sprays into the nose., Disp: , Rfl:  .  budesonide-formoterol (SYMBICORT) 160-4.5 MCG/ACT inhaler, Inhale 2 puffs into the lungs., Disp: , Rfl:  .  buPROPion (WELLBUTRIN) 100 MG tablet, Take 100 mg by mouth., Disp: , Rfl:  .  busPIRone (BUSPAR) 10 MG tablet, Take 10 mg by mouth., Disp: , Rfl:  .  Cholecalciferol (VITAMIN D3 SUPER STRENGTH) 2000 units TABS, Take 2,000 Units by mouth., Disp: , Rfl:  .  docusate sodium (STOOL SOFTENER) 100 MG capsule, Take 100 mg by mouth., Disp: , Rfl:  .  fluticasone (FLONASE) 50 MCG/ACT nasal spray, Place 1 spray into the nose., Disp: , Rfl:  .  insulin aspart protamine - aspart (NOVOLOG 70/30 MIX) (70-30) 100 UNIT/ML FlexPen, Inject 18 Units into the skin., Disp: , Rfl:  .  metFORMIN (GLUCOPHAGE) 500 MG tablet, Take 500 mg by mouth., Disp: , Rfl:  .  metoprolol tartrate (LOPRESSOR) 25 MG tablet, Take 25 mg by mouth., Disp: , Rfl:  .  montelukast (SINGULAIR) 10 MG tablet, Take 10 mg by mouth., Disp: , Rfl:  .  nitroGLYCERIN (NITROSTAT) 0.4 MG SL tablet, Place 0.4 mg under the tongue., Disp: , Rfl:  .  Omega-3 1000 MG CAPS, Take 2 g by mouth., Disp: , Rfl:  .  pramipexole (MIRAPEX) 0.5 MG tablet, Take 0.5 mg by  mouth., Disp: , Rfl:  .  pregabalin (LYRICA) 100 MG capsule, Take 100 mg by mouth., Disp: , Rfl:  .  ranitidine (ZANTAC) 150 MG capsule, Take 150 mg by mouth., Disp: , Rfl:  .  rosuvastatin (CRESTOR) 20 MG tablet, Take 20 mg by mouth., Disp: , Rfl:  .  tiotropium (SPIRIVA) 18 MCG inhalation capsule, Place 18 mcg into inhaler and inhale., Disp: , Rfl:  .  VITAMIN B COMPLEX-C CAPS, Take 1 tablet by mouth., Disp: , Rfl:  .  vitamin E 400 UNIT capsule, Take 400 Units by mouth., Disp: , Rfl:   Past Medical History: Past Medical History  Diagnosis Date  . Asthma   . Rheumatoid arthritis ([place])   . Hyperlipidemia   . Hypertension   . Sleep apnea   . Myocardial infarction ([place])   . Diabetes ([place])     Tobacco Use: History  Smoking status  . Former Smoker -- 1.00 packs/day for 57 years  . Types: Cigarettes  . Quit date: 02/10/2015  Smokeless tobacco  . Former Geophysical data processor: Recent Review Flowsheet Data    There is no flowsheet data to display.         POCT Glucose      02/26/15 1130 03/05/15 1130 03/05/15 1531 03/05/15 1535     POCT Blood Glucose   Pre-Exercise 224  mg/dL  Breakfast one hour ago       Post-Exercise 193 mg/dL       Pre-Exercise #2   268 mg/dL     Post-Exercise #2   233 mg/dL --  02/28/2015    Pre-Exercise #3  248 mg/dL      Post-Exercise #3  190 mg/dL         ADL UCSD:     Pulmonary Assessment Scores      02/18/15 1340 04/16/15 1130 06/27/15 1247   ADL UCSD   ADL Phase Entry Mid Exit   SOB Score total 65 56 32   Rest 1 0 0   Walk '2 2 2   '$ Stairs '4 5 3   '$ Bath 2 1 0   Dress 2 1 0   Shop '4 4 2     '$ 07/07/15 0823       ADL UCSD   ADL Phase Mid        Pulmonary Function Assessment:     Pulmonary Function Assessment - 02/18/15 1340    Initial Spirometry Results   FVC% 62 %   FEV1% 43 %   FEV1/FVC Ratio 55.33   Post Bronchodilator Spirometry Results   FVC% 54 %   FEV1% 62 %   FEV1/FVC Ratio 71.51   Breath   Bilateral Breath Sounds  Clear;Decreased   Shortness of Breath Yes;Limiting activity;Fear of Shortness of Breath      Exercise Target Goals:    Exercise Program Goal: Individual exercise prescription set with THRR, safety & activity barriers. Participant demonstrates ability to understand and report RPE using BORG scale, to self-measure pulse accurately, and to acknowledge the importance of the exercise prescription.  Exercise Prescription Goal: Starting with aerobic activity 30 plus minutes a day, 3 days per week for initial exercise prescription. Provide home exercise prescription and guidelines that participant acknowledges understanding prior to discharge.  Activity Barriers & Risk Stratification:     Activity Barriers & Cardiac Risk Stratification - 02/18/15 1340    Activity Barriers & Cardiac Risk Stratification   Activity Barriers Assistive Device;Deconditioning;Muscular Weakness;Balance Concerns   Cardiac Risk Stratification Moderate      6 Minute Walk:     6 Minute Walk      02/18/15 1817 04/16/15 1303 07/04/15 1202   6 Minute Walk   Phase Initial Mid Program Discharge   Distance 802 feet 1000 feet 1140 feet   Walk Time 6 minutes 6 minutes 6 minutes   # of Rest Breaks  0    RPE '14 17 11   '$ Perceived Dyspnea  '3 3 3   '$ Symptoms No No    Resting HR 88 bpm 78 bpm 78 bpm   Resting BP 136/72 mmHg 126/70 mmHg 156/84 mmHg   Max Ex. HR 86 bpm 88 bpm 90 bpm   Max Ex. BP 146/72 mmHg 148/80 mmHg 152/70 mmHg      Initial Exercise Prescription:     Initial Exercise Prescription - 02/18/15 1800    Date of Initial Exercise RX and Referring Provider   Date 02/18/15   Treadmill   MPH 1   Grade 0   Minutes 10   Recumbant Bike   Level 2   RPM 40   Watts 20   Minutes 10   NuStep   Level 2   Watts 40   Minutes 10   Arm Ergometer   Level 1   Watts 10   Minutes 10   Recumbant Elliptical  Level 2   RPM 40   Watts 20   Minutes 10   REL-XR   Level 2   Watts 40   Minutes 10    Biostep-RELP   Level 2   Watts 40   Minutes 10   Prescription Details   Frequency (times per week) 3   Duration Progress to 30 minutes of continuous aerobic without signs/symptoms of physical distress   Intensity   THRR REST +  30   Ratings of Perceived Exertion 11-15   Perceived Dyspnea 2-4   Progression   Progression Continue progressive overload as per policy without signs/symptoms or physical distress.   Resistance Training   Training Prescription Yes   Weight 2   Reps 10-15      Perform Capillary Blood Glucose checks as needed.  Exercise Prescription Changes:     Exercise Prescription Changes      02/26/15 1500 02/28/15 1100 03/05/15 1200 03/10/15 1600 03/14/15 1100   Exercise Review   Progression  Yes Yes Yes Yes   Response to Exercise   Blood Pressure (Admit) 130/78 mmHg   140/70 mmHg    Blood Pressure (Exercise) 158/82 mmHg   168/88 mmHg    Blood Pressure (Exit) 120/70 mmHg   136/76 mmHg    Heart Rate (Admit) 74 bpm   85 bpm    Heart Rate (Exercise) 100 bpm   115 bpm    Heart Rate (Exit) 86 bpm   89 bpm    Oxygen Saturation (Admit) 97 %   96 %    Oxygen Saturation (Exercise) 96 %   97 %    Oxygen Saturation (Exit) 92 %   94 %    Rating of Perceived Exertion (Exercise) 12   12    Perceived Dyspnea (Exercise) 3   4    Symptoms  None None none None   Comments  Reviewed individualized exercise prescription and made increases per departmental policy. Exercise increases were discussed with the patient and they were able to perform the new work loads without issue (no signs or symptoms).  Dyke is progressing well in the program. He is gaining strength and stamin and this is reflected in higher workloads and longer time.   Reviewed individualized exercise prescription and made increases per departmental policy. Exercise increases were discussed with the patient and they were able to perform the new work loads without issue (no signs or symptoms).    Duration  Progress to  30 minutes of continuous aerobic without signs/symptoms of physical distress Progress to 30 minutes of continuous aerobic without signs/symptoms of physical distress Progress to 50 minutes of aerobic without signs/symptoms of physical distress Progress to 50 minutes of aerobic without signs/symptoms of physical distress   Intensity  Rest + 30 Rest + 30 THRR unchanged THRR unchanged   Progression   Progression  Continue progressive overload as per policy without signs/symptoms or physical distress. Continue progressive overload as per policy without signs/symptoms or physical distress. Continue progressive overload as per policy without signs/symptoms or physical distress. Continue progressive overload as per policy without signs/symptoms or physical distress.   Resistance Training   Training Prescription (read-only) Yes Yes Yes Yes Yes   Weight (read-only) '2 2 2 3 3   '$ Reps (read-only) 10-12 10-12 10-12 10-12 10-12   Treadmill   MPH (read-only) 1 1 1.5 1.8 2.1   Grade (read-only) 0 0 0 0 0   Minutes (read-only) '10 10 15 15 15   '$ NuStep  Level (read-only) '2 2 2 3 5   '$ Watts (read-only) 40 40 40 40 60   Minutes (read-only) '10 15 15 15 15   '$ REL-XR   Level (read-only) '2 2 3 3 6   '$ Watts (read-only) 40 40 55 60 70   Minutes (read-only) '10 10 15 15 15     '$ 03/19/15 1500 03/21/15 1100 03/24/15 1200 03/28/15 1200 04/16/15 1200   Exercise Review   Progression Yes Yes Yes Yes Yes   Response to Exercise   Blood Pressure (Admit)    164/86 mmHg    Blood Pressure (Exercise)    136/78 mmHg    Blood Pressure (Exit)    128/68 mmHg    Heart Rate (Admit)    86 bpm    Heart Rate (Exercise)    132 bpm    Heart Rate (Exit)    113 bpm    Oxygen Saturation (Admit)    97 %    Oxygen Saturation (Exercise)    94 %    Oxygen Saturation (Exit)    94 %    Rating of Perceived Exertion (Exercise)    12    Perceived Dyspnea (Exercise)    4    Symptoms  None None None None   Comments  Braxon can exercise for full class  time, therefore efforts of progression will be achieved through interval training and continual challenges through higher workloads Tovia can exercise for full class time, therefore efforts of progression will be achieved through interval training and continual challenges through higher workloads [place] is progressing well in the program. He makes changes almost daily to his exercise in order to continuously challenge himself. He has shown improvement in strength, stamina and improvement in ADLs. He is vey motivated and compliant to the program.  Adjusted TM for [employer]. He is walking at an increased pace but with no incline. He is maintaining strong workloads on all the other equipment   Duration  Progress to 30 minutes of continuous aerobic without signs/symptoms of physical distress Progress to 30 minutes of continuous aerobic without signs/symptoms of physical distress Progress to 30 minutes of continuous aerobic without signs/symptoms of physical distress Progress to 30 minutes of continuous aerobic without signs/symptoms of physical distress   Intensity  THRR unchanged THRR unchanged THRR unchanged THRR unchanged   Progression   Progression  Continue progressive overload as per policy without signs/symptoms or physical distress. Continue progressive overload as per policy without signs/symptoms or physical distress. Continue progressive overload as per policy without signs/symptoms or physical distress. Continue progressive overload as per policy without signs/symptoms or physical distress.   Resistance Training   Training Prescription (read-only) Yes Yes Yes Yes Yes   Weight (read-only) '4 4 4 4 4   '$ Reps (read-only) 10-12 10-12 10-12 10-12 10-12   Interval Training   Interval Training  Yes Yes Yes Yes   Equipment  REL-XR REL-XR REL-XR REL-XR   Comments  L3/60W for 3 min followed by L7/0W for 1 minute, repeat for 15 minutes continuously L3/60W for 3 min followed by L7/0W for 1 minute, repeat for 15  minutes continuously L3/60W for 3 min followed by L7/0W for 1 minute, repeat for 15 minutes continuously L3/60W for 3 min followed by L7/0W for 1 minute, repeat for 15 minutes continuously   Treadmill   MPH (read-only) 2.1 2.1 2.1 2.6 2.7   Grade (read-only) 0 0 1 1 0   Minutes (read-only) '15 15 15 15 15   '$ NuStep  Level (read-only) '5 5 5 8 8   '$ Watts (read-only) 60 60 60 70 70   Minutes (read-only) '15 15 15 15 15   '$ REL-XR   Level (read-only) '6 7 7 8 8   '$ Watts (read-only) 70 80 80 100 100   Minutes (read-only) '15 15 15 15 15     '$ 04/18/15 1102 06/06/15 1253 06/11/15 1200 06/20/15 1300 06/23/15 1500   Exercise Review   Progression -- Yes Yes  Yes   Response to Exercise   Blood Pressure (Admit) 128/60 mmHg 146/76 mmHg   152/80 mmHg   Blood Pressure (Exercise) 148/60 mmHg 154/68 mmHg   170/70 mmHg   Blood Pressure (Exit) 124/64 mmHg 142/80 mmHg   132/74 mmHg   Heart Rate (Admit) 98 bpm 84 bpm   87 bpm   Heart Rate (Exercise) 113 bpm 127 bpm   129 bpm   Heart Rate (Exit) 102 bpm 80 bpm   102 bpm   Oxygen Saturation (Admit) 94 % 96 %  Room air --  Room air  97 %   Oxygen Saturation (Exercise) 94 % 94 %  Room air --  Room air  93 %   Oxygen Saturation (Exit) 94 % 96 %  Room air --  Room air  94 %   Rating of Perceived Exertion (Exercise) '11 12   13   '$ Perceived Dyspnea (Exercise) '3 3   4   '$ Symptoms None --  None --  None  None   Comments --    Started the EL goal due to EL at home.   Duration Progress to 30 minutes of continuous aerobic without signs/symptoms of physical distress Progress to 45 minutes of aerobic exercise without signs/symptoms of physical distress Progress to 45 minutes of aerobic exercise without signs/symptoms of physical distress  Progress to 50 minutes of aerobic without signs/symptoms of physical distress   Intensity THRR unchanged Rest + 30 Rest + 30  Rest + 40   Progression   Progression Continue progressive overload as per policy without signs/symptoms or  physical distress. Continue to progress workloads to maintain intensity without signs/symptoms of physical distress. Continue to progress workloads to maintain intensity without signs/symptoms of physical distress. Continue to progress workloads to maintain intensity without signs/symptoms of physical distress. Continue to progress workloads to maintain intensity without signs/symptoms of physical distress.   Resistance Training   Training Prescription  Yes Yes  Yes   Weight  '4 4  4   '$ Reps  10-15 10-15  10-12   Interval Training   Interval Training Yes       Equipment REL-XR REL-XR REL-XR     Comments L3/60W for 3 min followed by L7/0W for 1 minute, repeat for 15 minutes continuously --  LEvel 3 , 60 watts --  LEvel 3 , 60 watts     Treadmill   MPH  2.7   2.7   Grade  1.5   1.5   Minutes  15   10   NuStep   Level  '8 10 10 10   '$ Watts  70 80 100 100   Minutes  '15 15 15 15   '$ Elliptical   Level     5   Speed     3.4   Minutes     5   REL-XR   Level  '3 10  10   '$ Watts  60 100  100   Minutes  '15 15  15     '$ 06/30/15  1500 07/07/15 0800         Exercise Review   Progression Yes Yes      Response to Exercise   Blood Pressure (Admit)  156/84 mmHg  Data on 07/04/2015      Blood Pressure (Exercise)  152/70 mmHg      Blood Pressure (Exit)  130/80 mmHg      Heart Rate (Admit)  78 bpm      Heart Rate (Exercise)  127 bpm      Heart Rate (Exit)  100 bpm      Oxygen Saturation (Admit)  98 %      Oxygen Saturation (Exercise)  95 %      Oxygen Saturation (Exit)  95 %      Rating of Perceived Exertion (Exercise)  13      Perceived Dyspnea (Exercise)  4      Symptoms None None      Duration Progress to 50 minutes of aerobic without signs/symptoms of physical distress       Intensity Rest + 40 Rest + 40      Progression   Progression Continue to progress workloads to maintain intensity without signs/symptoms of physical distress. Continue to progress workloads to maintain intensity without  signs/symptoms of physical distress.      Resistance Training   Training Prescription Yes Yes      Weight 4 4      Reps 10-12 10-12      Treadmill   MPH 2.7 2.9      Grade 2 2      Minutes 10 10      NuStep   Level 10 10      Watts 100 100      Minutes 15 15      Elliptical   Level 5 5      Speed 3.5 3.5      Minutes 5 5      REL-XR   Level 10 10      Watts 100 100      Minutes 15 15      Home Exercise Plan   Plans to continue exercise at  Home      Frequency  Add 2 additional days to program exercise sessions.  Home Treadmill, Elliptical, Biodex Bicycle         Exercise Comments:     Exercise Comments      07/04/15 1233           Exercise Comments Tron does NOT have Silver Cassell Clement will exercise at home since he has a treadmill, an elliptical.           Discharge Exercise Prescription (Final Exercise Prescription Changes):     Exercise Prescription Changes - 07/07/15 0800    Exercise Review   Progression Yes   Response to Exercise   Blood Pressure (Admit) 156/84 mmHg  Data on 07/04/2015   Blood Pressure (Exercise) 152/70 mmHg   Blood Pressure (Exit) 130/80 mmHg   Heart Rate (Admit) 78 bpm   Heart Rate (Exercise) 127 bpm   Heart Rate (Exit) 100 bpm   Oxygen Saturation (Admit) 98 %   Oxygen Saturation (Exercise) 95 %   Oxygen Saturation (Exit) 95 %   Rating of Perceived Exertion (Exercise) 13   Perceived Dyspnea (Exercise) 4   Symptoms None   Intensity Rest + 40   Progression   Progression Continue to progress workloads to maintain intensity without signs/symptoms of physical distress.   Resistance  Training   Training Prescription Yes   Weight 4   Reps 10-12   Treadmill   MPH 2.9   Grade 2   Minutes 10   NuStep   Level 10   Watts 100   Minutes 15   Elliptical   Level 5   Speed 3.5   Minutes 5   REL-XR   Level 10   Watts 100   Minutes 15   Home Exercise Plan   Plans to continue exercise at Home   Frequency Add 2 additional days to  program exercise sessions.  Home Treadmill, Elliptical, Biodex Bicycle       Nutrition:  Target Goals: Understanding of nutrition guidelines, daily intake of sodium '1500mg'$ , cholesterol '200mg'$ , calories 30% from fat and 7% or less from saturated fats, daily to have 5 or more servings of fruits and vegetables.  Biometrics:     Pre Biometrics - 02/18/15 1819    Pre Biometrics   Height '5\' 9"'$  (1.753 m)   Weight 261 lb 4.8 oz (118.525 kg)   Waist Circumference 48 inches   Hip Circumference 53.5 inches   Waist to Hip Ratio 0.9 %   BMI (Calculated) 38.7       Nutrition Therapy Plan and Nutrition Goals:     Nutrition Therapy & Goals - 02/18/15 1340    Nutrition Therapy   Diet Mr Alewine is interested in meeting the dietitian. His wife does all the cooking - they like vegtables and salads. He does not eat much sweets, but he does not watch his portion sizes.      Nutrition Discharge: Rate Your Plate Scores:   Psychosocial: Target Goals: Acknowledge presence or absence of depression, maximize coping skills, provide positive support system. Participant is able to verbalize types and ability to use techniques and skills needed for reducing stress and depression.  Initial Review & Psychosocial Screening:     Initial Psych Review & Screening - 02/18/15 [place]? Yes   Comments Mr Kabler has good family support. He states he misses the activity he use to do before the COPD, but he is not depressed.   Barriers   Psychosocial barriers to participate in program There are no identifiable barriers or psychosocial needs.;The patient should benefit from training in stress management and relaxation.   Screening Interventions   Interventions Encouraged to exercise      Quality of Life Scores:     Quality of Life - 06/27/15 1509    Quality of Life Scores   Health/Function Pre 13.13 %   Health/Function Post 20.38 %   Health/Function % Change 55.22  %   Socioeconomic Pre 28.44 %   Socioeconomic Post 29.25 %   Socioeconomic % Change  2.85 %   Psych/Spiritual Pre 30 %   Psych/Spiritual Post 30 %   Psych/Spiritual % Change 0 %   Family Pre 25.2 %   Family Post 30 %   Family % Change 19.05 %   GLOBAL Pre 21.49 %   GLOBAL Post 25.56 %   GLOBAL % Change 18.94 %      PHQ-9:     Recent Review Flowsheet Data    Depression screen [facility] 2/9 07/04/2015 06/27/2015 02/18/2015   Decreased Interest 0 0 0   Down, Depressed, Hopeless 0 0 0   PHQ - 2 Score 0 0 0   Altered sleeping 0 0 -   Tired, decreased energy 1 1 -  Change in appetite 0 0 -   Feeling bad or failure about yourself  1 1 -   Trouble concentrating 0 0 -   Moving slowly or fidgety/restless 0 0 -   Suicidal thoughts 0 0 -   PHQ-9 Score 2 2 -   Difficult doing work/chores Not difficult at all Not difficult at all -      Psychosocial Evaluation and Intervention:     Psychosocial Evaluation - 06/11/15 1307    Psychosocial Evaluation & Interventions   Comments Counselor met with Mr. Aycock today for psychosocial evaluation.  He is a 70 year old who has COPD and diabetes.  He has a strong support system with a spouse of 17 years and (2) Adult children who live close by, as well as active involvement in his local church.  Mr. Uhlig has contended with sleep apnea for years and states the CPAP has helped.  He denies a history of depression or anxiety, or current symptoms - but takes medications that are meant to slow down his heart which can also treat depression/anxiety symptoms.  He reports typically being in a positive mood and other than his health he has minimal stress in his life.  Mr. Hagedorn has goals to complete this program and be stronger & get back to activities he enjoys.  [place] more there and for longer periods of time.  He already has seen an improvement since being here with increased energy and stamina and has lost 10-15 pounds.   Mr. Tuazon has a treadmill and other gym equipment at his home that he plans to use following completion of this program.        Psychosocial Re-Evaluation:     Psychosocial Re-Evaluation      03/28/15 1254 04/02/15 1310 04/11/15 1214 06/25/15 1157     Psychosocial Re-Evaluation   Interventions Encouraged to attend Cardiac Rehabilitation for the exercise Encouraged to attend Cardiac Rehabilitation for the exercise      Comments Exodus said sometimes it is hard to accept that his blood pressure and blood sugars and breathing won't get any better due to [employer] in [place] "but it is just something I need to accept.  Eveline Keto left a vm on the Pulmonary Rehab vm that he is still sick and won't be in to exercise today.  Radek has had a lot going on the past 10 days and lost 8 lbs not feeling well.  Follow up with Mr. Cruickshank stating he is experiencing multiple benefits from this program.  He is feeling stronger and more steady and is able to return to more normal activities he hasn't done in awhile.  Mr. Iorio states he is doing yard work and gardening and even helping remodel a home for his daughter.  He states he is sleeping okay and his mood has remained stable since coming into this program.  Counselor commended Mr. Breton for all of his hard work and progress made.        Education: Education Goals: Education classes will be provided on a weekly basis, covering required topics. Participant will state understanding/return demonstration of topics presented.  Learning Barriers/Preferences:     Learning Barriers/Preferences - 02/18/15 1340    Learning Barriers/Preferences   Learning Barriers None   Learning Preferences Group Instruction;Individual Instruction;Pictoral;Skilled Demonstration;Written Material;Verbal Instruction;Video      Education Topics: Initial Evaluation Education: - Verbal, written and demonstration of respiratory meds, RPE/PD scales, oximetry  and breathing  techniques. Instruction on use of nebulizers and MDIs: cleaning and proper use, rinsing mouth with steroid doses and importance of monitoring MDI activations.          Pulmonary Rehab from 07/02/2015 in [facility] Cardiac and Pulmonary Rehab   Date  06/30/15   Educator  LB   Instruction Review Code  2- meets goals/outcomes      General Nutrition Guidelines/Fats and Fiber: -Group instruction provided by verbal, written material, models and posters to present the general guidelines for heart healthy nutrition. Gives an explanation and review of dietary fats and fiber.      Pulmonary Rehab from 07/02/2015 in [facility] Cardiac and Pulmonary Rehab   Date  03/31/15   Educator  C. Lenny Pastel, RD   Instruction Review Code  2- meets goals/outcomes      Controlling Sodium/Reading Food Labels: -Group verbal and written material supporting the discussion of sodium use in heart healthy nutrition. Review and explanation with models, verbal and written materials for utilization of the food label.      Pulmonary Rehab from 07/02/2015 in [facility] Cardiac and Pulmonary Rehab   Date  04/14/15   Educator  LB   Instruction Review Code  2- meets goals/outcomes      Exercise Physiology & Risk Factors: - Group verbal and written instruction with models to review the exercise physiology of the cardiovascular system and associated critical values. Details cardiovascular disease risk factors and the goals associated with each risk factor.   Aerobic Exercise & Resistance Training: - Gives group verbal and written discussion on the health impact of inactivity. On the components of aerobic and resistive training programs and the benefits of this training and how to safely progress through these programs.      Pulmonary Rehab from 07/02/2015 in [facility] Cardiac and Pulmonary Rehab   Date  03/19/15   Educator  SWay   Instruction Review Code  2- meets goals/outcomes      Flexibility, Balance, General Exercise Guidelines: - Provides  group verbal and written instruction on the benefits of flexibility and balance training programs. Provides general exercise guidelines with specific guidelines to those with heart or lung disease. Demonstration and skill practice provided.      Pulmonary Rehab from 07/02/2015 in [facility] Cardiac and Pulmonary Rehab   Date  07/02/15   Educator  Salley Hews, PT   Instruction Review Code  2- meets goals/outcomes      Stress Management: - Provides group verbal and written instruction about the health risks of elevated stress, cause of high stress, and healthy ways to reduce stress.      Pulmonary Rehab from 07/02/2015 in [facility] Cardiac and Pulmonary Rehab   Date  04/16/15   Educator  [facility]   Instruction Review Code  2- meets goals/outcomes      Depression: - Provides group verbal and written instruction on the correlation between heart/lung disease and depressed mood, treatment options, and the stigmas associated with seeking treatment.      Pulmonary Rehab from 07/02/2015 in [facility] Cardiac and Pulmonary Rehab   Date  05/28/15   Educator  Berle Mull   Instruction Review Code  2- meets goals/outcomes      Exercise & Equipment Safety: - Individual verbal instruction and demonstration of equipment use and safety with use of the equipment.      Pulmonary Rehab from 07/02/2015 in [facility] Cardiac and Pulmonary Rehab   Date  02/26/15   Educator  C. Catharina Pica,RN   Instruction  Review Code  1- partially meets, needs review/practice      Infection Prevention: - Provides verbal and written material to individual with discussion of infection control including proper hand washing and proper equipment cleaning during exercise session.      Pulmonary Rehab from 07/02/2015 in [facility] Cardiac and Pulmonary Rehab   Date  02/26/15   Educator  C. EnterkinRN   Instruction Review Code  1- partially meets, needs review/practice      Falls Prevention: - Provides verbal and written material to individual with  discussion of falls prevention and safety.      Pulmonary Rehab from 07/02/2015 in [facility] Cardiac and Pulmonary Rehab   Date  02/26/15   Educator  C. Arvo Ealy,RN   Instruction Review Code  1- partially meets, needs review/practice      Diabetes: - Individual verbal and written instruction to review signs/symptoms of diabetes, desired ranges of glucose level fasting, after meals and with exercise. Advice that pre and post exercise glucose checks will be done for 3 sessions at entry of program.      Pulmonary Rehab from 07/02/2015 in [facility] Cardiac and Pulmonary Rehab   Date  02/26/15   Educator  C. EnterkinRN [facility] said he did not check his blood sugar before Pulm Reha]   Instruction Review Code  1- partially meets, needs review/practice [Cobe reports that he has had diabetes for about 20 years. ]      Chronic Lung Diseases: - Group verbal and written instruction to review new updates, new respiratory medications, new advancements in procedures and treatments. Provide informative websites and "800" numbers of self-education.   Lung Procedures: - Group verbal and written instruction to describe testing methods done to diagnose lung disease. Review the outcome of test results. Describe the treatment choices: Pulmonary Function Tests, ABGs and oximetry.   Energy Conservation: - Provide group verbal and written instruction for methods to conserve energy, plan and organize activities. Instruct on pacing techniques, use of adaptive equipment and posture/positioning to relieve shortness of breath.   Triggers: - Group verbal and written instruction to review types of environmental controls: home humidity, furnaces, filters, dust mite/pet prevention, HEPA vacuums. To discuss weather changes, air quality and the benefits of nasal washing.      Pulmonary Rehab from 07/02/2015 in [facility] Cardiac and Pulmonary Rehab   Date  03/10/15   Educator  LB   Instruction Review Code  2- meets goals/outcomes       Exacerbations: - Group verbal and written instruction to provide: warning signs, infection symptoms, calling MD promptly, preventive modes, and value of vaccinations. Review: effective airway clearance, coughing and/or vibration techniques. Create an Sport and exercise psychologist.   Oxygen: - Individual and group verbal and written instruction on oxygen therapy. Includes supplement oxygen, available portable oxygen systems, continuous and intermittent flow rates, oxygen safety, concentrators, and Medicare reimbursement for oxygen.   Respiratory Medications: - Group verbal and written instruction to review medications for lung disease. Drug class, frequency, complications, importance of spacers, rinsing mouth after steroid MDI's, and proper cleaning methods for nebulizers.      Pulmonary Rehab from 02/18/2015 in [facility] PUL[facility] REHAB   Date  02/18/15   Educator  LB   Instruction Review Code  2- meets goals/outcomes      AED/CPR: - Group verbal and written instruction with the use of models to demonstrate the basic use of the AED with the basic ABC's of resuscitation.   Breathing Retraining: - Provides individuals verbal  and written instruction on purpose, frequency, and proper technique of diaphragmatic breathing and pursed-lipped breathing. Applies individual practice skills.      Pulmonary Rehab from 07/02/2015 in [facility] Cardiac and Pulmonary Rehab   Date  02/26/15   Educator  C. EnterkinRN   Instruction Review Code  1- partially meets, needs review/practice [Adien said would start coughing alot since his R nare conges]      Anatomy and Physiology of the Lungs: - Group verbal and written instruction with the use of models to provide basic lung anatomy and physiology related to function, structure and complications of lung disease.   Heart Failure: - Group verbal and written instruction on the basics of heart failure: signs/symptoms, treatments, explanation of ejection  fraction, enlarged heart and cardiomyopathy.      Pulmonary Rehab from 07/02/2015 in [facility] Cardiac and Pulmonary Rehab   Date  06/20/15   Educator  [place]   Instruction Review Code  2- meets goals/outcomes      Sleep Apnea: - Individual verbal and written instruction to review Obstructive Sleep Apnea. Review of risk factors, methods for diagnosing and types of masks and machines for OSA.      Pulmonary Rehab from 02/18/2015 in [place]   Date  02/18/15   Educator  LB   Instruction Review Code  2- meets goals/outcomes      Anxiety: - Provides group, verbal and written instruction on the correlation between heart/lung disease and anxiety, treatment options, and management of anxiety.   Relaxation: - Provides group, verbal and written instruction about the benefits of relaxation for patients with heart/lung disease. Also provides patients with examples of relaxation techniques.   Knowledge Questionnaire Score:     Knowledge Questionnaire Score - 07/07/15 9629    Knowledge Questionnaire Score   Post Score 10/10       Core Components/Risk Factors/Patient Goals at Admission:     Personal Goals and Risk Factors at Admission - 02/18/15 1340    Core Components/Risk Factors/Patient Goals on Admission    Weight Management Yes   Intervention (read-only) Learn and follow the exercise and diet guidelines while in the program. Utilize the nutrition and education classes to help gain knowledge of the diet and exercise expectations in the program  Mr Crear is interested in meeting the dietitian. His wife does all the cooking - they like vegtables and salads. He does not eat much sweets, but he does not watch his portion sizes.   Admit Weight 260 lb (117.935 kg)   Goal Weight: Short Term 200 lb (90.719 kg)   Sedentary Yes   Intervention (read-only) While in program, learn and follow the exercise prescription taught. Start at a low level workload and  increase workload after able to maintain previous level for 30 minutes. Increase time before increasing intensity.  Mr Hodkinson wants to improve his exercise capacity. He likes to do yardwork and wood working.   Tobacco Cessation Yes   Number of packs per day 2ppd for 58yr; he quit a week ago - can not use chantix and uses no other nicotine aid.   Intervention (read-only) Utilize your health care professional team to help with smoking cessation while in the program. Your doctor can prescribe medications to aid in cessation. The program can provide information and counseling as needed.   Improve shortness of breath with ADL's Yes   Intervention (read-only) While in program, learn and follow the exercise prescription taught. Start at a low level workload  and increase workload ad advised by the exercise physiologist. Increase time before increasing intensity.  Mr Fariss would like to have a better understanding of his shortness of breath and less fear with it.    Develop more efficient breathing techniques such as purse lipped breathing and diaphragmatic breathing; and practicing self-pacing with activity Yes   Intervention (read-only) While in program, learn and utilize the specific breathing techniques taught to you. Continue to practice and use the techniques as needed.  Instructed on PLB and it's benefits.   Increase knowledge of respiratory medications and ability to use respiratory devices properly  Yes   Intervention (read-only) While in program, learn to administer MDI, nebulizer, and spacer properly.;Learn to take respiratory medicine as ordered.;While in program, learn to Clean MDI, nebulizers, and spacers properly.  Reviewed MDI's and gave Mr Shankman a spacer.   Diabetes Yes   Goal Blood glucose control identified by blood glucose values, HgbA1C. Participant verbalizes understanding of the signs/symptoms of hyper/hypo glycemia, proper foot care and importance of medication and nutrition plan for  blood glucose control.   Intervention (read-only) Provide nutrition & aerobic exercise along with prescribed medications to achieve blood glucose in normal ranges: Fasting 65-99 mg/dL   Hypertension Yes   Goal Participant will see blood pressure controlled within the values of 140/57m/Hg or within value directed by their physician.   Intervention (read-only) Provide nutrition & aerobic exercise along with prescribed medications to achieve BP 140/90 or less.   Understand more about Heart/Pulmonary Disease. Yes  Mr TCanionwould like to learn more about COPD and the daily management of the disease.   Intervention While in program utilize professionals for any questions, and attend the education sessions. Great websites to use are www.americanheart.org or www.lung.org for reliable information.  Reviewed Mr Volker's MDIs: Albuterol, Spiriva Respimat, and Symbicort. Instructed on aerochamber and gave him a new chamber.      Core Components/Risk Factors/Patient Goals Review:      Goals and Risk Factor Review      02/26/15 1130 02/28/15 1301 03/05/15 1130 03/10/15 1130 03/10/15 1405   Core Components/Risk Factors/Patient Goals Review   Personal Goals Review Quit Smoking (p) Weight Management;Diabetes;Hypertension Increase knowledge of respiratory medications and ability to use respiratory devices properly. Increase Aerobic Exercise and Physical Activity;Improve shortness of breath with ADL's;Develop more efficient breathing techniques such as purse lipped breathing and diaphragmatic breathing and practicing self-pacing with activity.;Understand more about Heart/Pulmonary Disease Quit Smoking   Weight Management (read-only)   Goals Progress/Improvement seen  (p) Yes   Yes   Comments  (p) Has gone from 50 in pants to 48 inche pants.    Continues to stay tobacco free.  States is the hardest thing he has ever done and he is determined to stay quit.   Increase Aerobic Exercise and Physical Activity  (read-only)   Goals Progress/Improvement seen     Yes    Comments    Mr TResslerstates he feels stronger when he is walking. He was motivated today on the treadmill to see the 1 mile marker!    Quit Smoking (read-only)   Goals Progress/Improvement seen Yes  Yes     Comments Mr TMesterhas not had a cigarrette since his quit date which is now about 2 weeks. He does not use any nicotine replacement.  Mr TMahrtis smoke free - no cigarrettes since his quit date.     Understand more about Heart/Pulmonary Disease (read-only)   Goals Progress/Improvement seen  Yes    Comments    Mr Reihl attended the Trigger lecture today which had good tips on staying healthy with COPD by managing triggers in the home and outdoors with pollen and air pollution.    Improve shortness of breath with ADL's (read-only)   Comments    Mr Keadle has completed 2 weeks of exercise and has not noticed an improvement in his shortness of breath. As he progresses with his exercise, I am hoping he will have some change to his breathing.     Breathing Techniques (read-only)   Comments    PLB is a hard technique for Mr Gamel. I recommended he take a breath in through his mouth instead of his nose and then blow out of his mouth with pursed lips.    Increase knowledge of respiratory medications (read-only)   Goals Progress/Improvement seen    Yes     Comments   Mr Massie carries his Poventil inhaler with him and did use it before [name] and after the treadmill. His exercise goal did increase to 46mns and 1.540m.     Diabetes (read-only)   Goal  (p) Blood glucose control identified by blood glucose values, HgbA1C. Participant verbalizes understanding of the signs/symptoms of hyper/hypo glycemia, proper foot care and importance of medication and nutrition plan for blood glucose control.        03/19/15 1502 03/24/15 1205 03/28/15 1253 04/01/15 0651 04/11/15 1215   Weight Management (read-only)   Goals Progress/Improvement seen      Yes   Comments     LeJavenas lost 8lbs just not feeling like eating the past 10 days has seen MD and antibiotic changed etc.    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  LeLuizas been making impressive improvments on all the machines during his exercise time. He can exercise continuously for the entire 55 minutes allotted for aerobic exercise. For the rest of his time in the program we will continue to manipulate his intensity in order to keep him moving forward with progression.     Quit Smoking (read-only)   Goals Progress/Improvement seen Yes    Yes   Comments No cigarrettes since his quit date.       Understand more about Heart/Pulmonary Disease (read-only)   Goals Progress/Improvement seen      Yes   Comments     LeKazumiaid he has recently had more problems with his lungs and MD increased his steroids.    Improve shortness of breath with ADL's (read-only)   Goals Progress/Improvement seen   Yes   Yes   Comments  Feels like his SOB is steadily improving, however, no large gains noted.   LeVenusaid he can tell he hasn't been able to exerise the past 10 days.   Breathing Techniques (read-only)   Goals Progress/Improvement seen   Yes      Comments  LeJarrahas been attempting to use correct  breathing techniques, however, does forget sometimes and notices when SOB increases.  Is trying to work on this daily.      Increase knowledge of respiratory medications (read-only)   Goals Progress/Improvement seen      Yes   Comments     LeJeriaid he is still on his 3 inhalers  and has no questions.    Diabetes (read-only)   Progress seen towards goals   Yes  Yes   Comments   Daelen  reports his blood sugar 3 days ago 3 hours after a meal was good at 138.   Blood sugar in the 200 range today since his  steroids were increased.    Hypertension (read-only)   Progress seen toward goals   Yes  Yes   Comments   Gerik reports the MD recently increased his or changed his medicine for his blood  pressure. Systolic in 154'M upon arrival.   Blood pressure is very stable and good.      05/30/15 1311 06/23/15 1130 07/07/15 0834       Core Components/Risk Factors/Patient Goals Review   Personal Goals Review Develop more efficient breathing techniques such as purse lipped breathing and diaphragmatic breathing and practicing self-pacing with activity. Sedentary;Increase Strength and Stamina;Improve shortness of breath with ADL's;Develop more efficient breathing techniques such as purse lipped breathing and diaphragmatic breathing and practicing self-pacing with activity.;Increase knowledge of respiratory medications and ability to use respiratory devices properly. Increase Strength and Stamina;Develop more efficient breathing techniques such as purse lipped breathing and diaphragmatic breathing and practicing self-pacing with activity.;Increase knowledge of respiratory medications and ability to use respiratory devices properly.     Review Reviewed PLB while exercising Mr Manard has made improvements in is stamina and strength. 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 At home, he has an Elliptical, treadmill, and biodes bike which he plans to continuee as his exercise plan for 2-3 days/week . His daughter and wife plan toexercise with him. In [place] today, we started Mr Edie on the EL and will continue it until he graduates. He has a good understanding of his MDI's and uses PLB independently with activity. is exercise on Mr Nagy graduates today from [employer]. He has improved his stamina and endurance; he is more active in his daily life. His post 12md improved by 3322f 42% - minimal importance difference for COPD is 98.85f66fHe has  gained knowledge about his MDI' and uses PLB. He plans to continue exercise at home on his home equipment  with his wife and daughter  as part of his daily management of his COPD.     Expected Outcomes This will help relieve SOB while exercising. Continue exercising after graduation and continue managing his COPD daily          Core Components/Risk Factors/Patient Goals at Discharge (Final Review):      Goals and Risk Factor Review - 07/07/15 0834    Core Components/Risk Factors/Patient Goals Review   Personal Goals Review Increase Strength and Stamina;Develop more efficient breathing techniques such as purse lipped breathing and diaphragmatic breathing and practicing self-pacing with activity.;Increase knowledge of respiratory medications and ability to use respiratory devices properly.   Review Mr TucHaraaduates today from [employer] has improved his stamina and endurance; he is more active in his daily life. His post [id number] by [id number]% - minimal importance difference for COPD is 98.85ft. 73fhas gained knowledge about his MDI' and uses PLB. He plans to continue exercise at home on his home equipment  with his wife and daughter  as part of his daily management of his COPD.      ITP Comments:     ITP Comments      02/26/15 1130 02/28/15  1149 03/07/15 1241 03/10/15 1301 03/14/15 1141   ITP Comments Chronic pain level "6"; unchanged with exercise Personal and exercise goals expected to be met in 33 more sessions. Progress on specific individualized goals will be charted in patient's ITP. Upon completion of the program the patient will be comfortable managing exercise goals and progression on their own.  Personal and exercise goals expected to be met in 31 more sessions. Progress on specific individualized goals will be charted in patient's ITP. Upon completion of the program the patient will be comfortable managing exercise goals and progression on their own.  Personal and Exercsie  goals expeted to be met in 30   more sessions. Progress on specific goals will be charted in the ITP.  Personal and exercise goals expected to be met in 29 more sessions. Progress on specific individualized goals will be charted in patient's ITP. Upon completion of the program the patient will be comfortable managing exercise goals and progression on their own.      03/21/15 1150 03/21/15 1320 03/24/15 1219 03/26/15 1254 03/28/15 1230   ITP Comments Personal and exercise goals expected to be met in 26 more sessions. Progress on specific individualized goals will be charted in patient's ITP. Upon completion of the program the patient will be comfortable managing exercise goals and progression on their own.  Mr. Terry has a cut on his left pointer finger from a knife.  It is swollen and red, but has no puss.  He is going to the [place] today to have it looked at. Mr. Djordjevic went to the [place]. He reported they gave him a tetanus shot and cleaned it out. He also reported they gave him 10 days of antibiotics. We discussed for him to possibly eat some yougart while he is taking antibiotics. Gerik should achieve his goals by 25 more sessions.  Cuauhtemoc should complete his goals including exercise goals by session 36 and he is at session 13 today.  Personal and exercise goals expected to be met in 22 more sessions. Progress on specific individualized goals will be charted in patient's ITP. Upon completion of the program the patient will be comfortable managing exercise goals and progression on their own.      03/28/15 1246 03/28/15 1255 03/31/15 1214 03/31/15 1246 04/02/15 1308   ITP Comments Simeon Craft states he checks his blood sugars every few days and it was 138 three hours after a meal. Bralen reports they recently increased his blood pressure medicine but his systolic is still in the 885'O and he has an appt in the future to follow up with his MD about that. Shimshon said his weight is staying about the same since he knows he likes  his meat. He said he has already met with 2 registered [facility] dieticians.  Graydon said sometimes it is hard to accept that his blood pressure and blood sugars and breathing won't get any better due to [employer] [place] "but it is just something I need to accept.  Hermon's finger infection is doing better. Naven should complete his exercise goals by visit 36 or by the 36th session.  Sinan left early since he said he fellt terrible after being up all night coughing. [place] reports he just finished 10 days of antibiotics for his finger. I advised him to contact his MD. Simeon Craft did hear education by our Registered Dietician. He did 5 minute warm up on Recumbent Elliptical on Level 3 and only a couple of minutes of level 7 then  I encouraged him to leave and check with his MD.  Eveline Keto left a vm on the Pulmonary Rehab vm that he is still sick and won't be in to exercise today.      04/11/15 1213 04/11/15 1230 04/16/15 1216 04/18/15 1238 04/18/15 1335   ITP Comments Simeon Craft lost 8 lbs in the past 10 days since he said he felt sick and couldn't eat. Blood sugar today was in the 200 range. Chord reports his steroids were increased and he is on an antibioitic.  Personal and exercise goals expected to be met in 21 more sessions. Progress on specific individualized goals will be charted in patient's ITP. Upon completion of the program the patient will be comfortable managing exercise goals and progression on their own.  Personal and exercise goals expected to be met in 19 more sessions. Progress on specific individualized goals will be charted in patient's ITP. Upon completion of the program the patient will be comfortable managing exercise goals and progression on their own.  Personal and exercise goals expected to be met in 18 more sessions. Progress on specific individualized goals will be charted in patient's ITP. Upon completion of the program the patient will be comfortable managing exercise goals and progression on  their own.  Mr Tackitt shortened his exercise goal on the Treadmill to 50mns - he became very sweaty and light-headed. The TM was stopped and vitals taken: 148/60 BP; 96%/117, and glucose was 175. He rested , had water, and continued exercise on the NS.     05/14/15 1130           ITP Comments Mr TBianchiniwill be off for a week or two to care for his brother's wife during his brother's surgery.          Comments: 30 day review

## 2015-07-07 NOTE — Progress Notes (Signed)
Discharge Summary  Patient Details  Name: Jeffrey Burgess MRN: [id number] Date of Birth: 1945-07-17 Referring Provider: [facility]    Number of Visits: 45  Reason for Discharge:  Patient reached a stable level of exercise. Patient independent in their exercise.  Smoking History:  History  Smoking status   Former Smoker -- 1.00 packs/day for 57 years   Types: Cigarettes   Quit date: 02/10/2015  Smokeless tobacco   Former User    Diagnosis:  Chronic obstructive pulmonary disease, unspecified COPD type (HCC)  ADL [facility]:     Pulmonary Assessment Scores      02/18/15 1340 04/16/15 1130 06/27/15 1247   ADL UCSD   ADL Phase Entry Mid Exit   SOB Score total 65 56 32   Rest 1 0 0   Walk 2 2 2    Stairs 4 5 3    Bath 2 1 0   Dress 2 1 0   Shop 4 4 2      07/07/15 0823       ADL [facility]   ADL Phase Mid        Initial Exercise Prescription:     Initial Exercise Prescription - 02/18/15 1800    Date of Initial Exercise RX and Referring Provider   Date 02/18/15   Treadmill   MPH 1   Grade 0   Minutes 10   Recumbant Bike   Level 2   RPM 40   Watts 20   Minutes 10   NuStep   Level 2   Watts 40   Minutes 10   Arm Ergometer   Level 1   Watts 10   Minutes 10   Recumbant Elliptical   Level 2   RPM 40   Watts 20   Minutes 10   REL-XR   Level 2   Watts 40   Minutes 10   Biostep-RELP   Level 2   Watts 40   Minutes 10   Prescription Details   Frequency (times per week) 3   Duration Progress to 30 minutes of continuous aerobic without signs/symptoms of physical distress   Intensity   THRR REST +  30   Ratings of Perceived Exertion 11-15   Perceived Dyspnea 2-4   Progression   Progression Continue progressive overload as per policy without signs/symptoms or physical distress.   Resistance Training   Training Prescription Yes   Weight 2   Reps 10-15      Discharge Exercise Prescription (Final Exercise Prescription Changes):      Exercise Prescription Changes - 07/07/15 0800    Exercise Review   Progression Yes   Response to Exercise   Blood Pressure (Admit) 156/84 mmHg  Data on 07/04/2015   Blood Pressure (Exercise) 152/70 mmHg   Blood Pressure (Exit) 130/80 mmHg   Heart Rate (Admit) 78 bpm   Heart Rate (Exercise) 127 bpm   Heart Rate (Exit) 100 bpm   Oxygen Saturation (Admit) 98 %   Oxygen Saturation (Exercise) 95 %   Oxygen Saturation (Exit) 95 %   Rating of Perceived Exertion (Exercise) 13   Perceived Dyspnea (Exercise) 4   Symptoms None   Intensity Rest + 40   Progression   Progression Continue to progress workloads to maintain intensity without signs/symptoms of physical distress.   Resistance Training   Training Prescription Yes   Weight 4   Reps 10-12   Treadmill   MPH 2.9   Grade 2   Minutes 10  NuStep   Level 10   Watts 100   Minutes 15   Elliptical   Level 5   Speed 3.5   Minutes 5   REL-XR   Level 10   Watts 100   Minutes 15   Home Exercise Plan   Plans to continue exercise at Home   Frequency Add 2 additional days to program exercise sessions.  Home Treadmill, Elliptical, Biodex Bicycle      Functional Capacity:     6 Minute Walk      02/18/15 1817 04/16/15 1303 07/04/15 1202   6 Minute Walk   Phase Initial Mid Program Discharge   Distance 802 feet 1000 feet 1140 feet   Walk Time 6 minutes 6 minutes 6 minutes   # of Rest Breaks  0    RPE Perceived Dyspnea  Symptoms No No    Resting HR 88 bpm 78 bpm 78 bpm   Resting BP 136/72 mmHg 126/70 mmHg 156/84 mmHg   Max Ex. HR 86 bpm 88 bpm 90 bpm   Max Ex. BP 146/72 mmHg 148/80 mmHg 152/70 mmHg      Psychological, QOL, Others - Outcomes: PHQ 2/9: Depression screen [facility] 2/9 07/04/2015 06/27/2015 02/18/2015  Decreased Interest 0 0 0  Down, Depressed, Hopeless 0 0 0  PHQ - 2 Score 0 0 0  Altered sleeping 0 0 -  Tired, decreased energy 1 1 -  Change in appetite 0 0 -  Feeling bad or failure about  yourself  1 1 -  Trouble concentrating 0 0 -  Moving slowly or fidgety/restless 0 0 -  Suicidal thoughts 0 0 -  PHQ-9 Score 2 2 -  Difficult doing work/chores Not difficult at all Not difficult at all -    Quality of Life:     Quality of Life - 06/27/15 1509    Quality of Life Scores   Health/Function Pre 13.13 %   Health/Function Post 20.38 %   Health/Function % Change 55.22 %   Socioeconomic Pre 28.44 %   Socioeconomic Post 29.25 %   Socioeconomic % Change  2.85 %   Psych/Spiritual Pre 30 %   Psych/Spiritual Post 30 %   Psych/Spiritual % Change 0 %   Family Pre 25.2 %   Family Post 30 %   Family % Change 19.05 %   GLOBAL Pre 21.49 %   GLOBAL Post 25.56 %   GLOBAL % Change 18.94 %      Personal Goals: Goals established at orientation with interventions provided to work toward goal.     Personal Goals and Risk Factors at Admission - 02/18/15 1340    Core Components/Risk Factors/Patient Goals on Admission    Weight Management Yes   Intervention (read-only) Learn and follow the exercise and diet guidelines while in the program. Utilize the nutrition and education classes to help gain knowledge of the diet and exercise expectations in the program  Jeffrey Burgess is interested in meeting the dietitian. His wife does all the cooking - they like vegtables and salads. He does not eat much sweets, but he does not watch his portion sizes.   Admit Weight 260 lb (117.935 kg)   Goal Weight: Short Term 200 lb (90.719 kg)   Sedentary Yes   Intervention (read-only) While in program, learn and follow the exercise prescription taught. Start at a low level workload and increase workload after able to maintain previous level for 30  minutes. Increase time before increasing intensity.  Jeffrey Burgess wants to improve his exercise capacity. He likes to do yardwork and wood working.   Tobacco Cessation Yes   Number of packs per day 2ppd for 37yrs; he quit a week ago - can not use chantix and uses no  other nicotine aid.   Intervention (read-only) Utilize your health care professional team to help with smoking cessation while in the program. Your doctor can prescribe medications to aid in cessation. The program can provide information and counseling as needed.   Improve shortness of breath with ADL's Yes   Intervention (read-only) While in program, learn and follow the exercise prescription taught. Start at a low level workload and increase workload ad advised by the exercise physiologist. Increase time before increasing intensity.  Jeffrey Fjeld would like to have a better understanding of his shortness of breath and less fear with it.    Develop more efficient breathing techniques such as purse lipped breathing and diaphragmatic breathing; and practicing self-pacing with activity Yes   Intervention (read-only) While in program, learn and utilize the specific breathing techniques taught to you. Continue to practice and use the techniques as needed.  Instructed on PLB and it's benefits.   Increase knowledge of respiratory medications and ability to use respiratory devices properly  Yes   Intervention (read-only) While in program, learn to administer MDI, nebulizer, and spacer properly.;Learn to take respiratory medicine as ordered.;While in program, learn to Clean MDI, nebulizers, and spacers properly.  Reviewed MDI's and gave Jeffrey Cardozo a spacer.   Diabetes Yes   Goal Blood glucose control identified by blood glucose values, HgbA1C. Participant verbalizes understanding of the signs/symptoms of hyper/hypo glycemia, proper foot care and importance of medication and nutrition plan for blood glucose control.   Intervention (read-only) Provide nutrition & aerobic exercise along with prescribed medications to achieve blood glucose in normal ranges: Fasting 65-99 mg/dL   Hypertension Yes   Goal Participant will see blood pressure controlled within the values of 140/4mm/Hg or within value directed by their  physician.   Intervention (read-only) Provide nutrition & aerobic exercise along with prescribed medications to achieve BP 140/90 or less.   Understand more about Heart/Pulmonary Disease. Yes  Jeffrey Caseres would like to learn more about COPD and the daily management of the disease.   Intervention While in program utilize professionals for any questions, and attend the education sessions. Great websites to use are www.americanheart.org or www.lung.org for reliable information.  Reviewed Jeffrey Solorio's MDIs: Albuterol, Spiriva Respimat, and Symbicort. Instructed on aerochamber and gave him a new chamber.       Personal Goals Discharge:     Goals and Risk Factor Review      02/26/15 1130 02/28/15 1301 03/05/15 1130 03/10/15 1130 03/10/15 1405   Core Components/Risk Factors/Patient Goals Review   Personal Goals Review Quit Smoking (p) Weight Management;Diabetes;Hypertension Increase knowledge of respiratory medications and ability to use respiratory devices properly. Increase Aerobic Exercise and Physical Activity;Improve shortness of breath with ADL's;Develop more efficient breathing techniques such as purse lipped breathing and diaphragmatic breathing and practicing self-pacing with activity.;Understand more about Heart/Pulmonary Disease Quit Smoking   Weight Management (read-only)   Goals Progress/Improvement seen  (p) Yes   Yes   Comments  (p) Has gone from 50 in pants to 48 inche pants.    Continues to stay tobacco free.  States is the hardest thing he has ever done and he is determined to stay quit.   Increase Aerobic  Exercise and Physical Activity (read-only)   Goals Progress/Improvement seen     Yes    Comments    Jeffrey Delaughter states he feels stronger when he is walking. He was motivated today on the treadmill to see the 1 mile marker!    Quit Smoking (read-only)   Goals Progress/Improvement seen Yes  Yes     Comments Jeffrey Raczkowski has not had a cigarrette since his quit date which is now about 2  weeks. He does not use any nicotine replacement.  Jeffrey Shostak is smoke free - no cigarrettes since his quit date.     Understand more about Heart/Pulmonary Disease (read-only)   Goals Progress/Improvement seen     Yes    Comments    Jeffrey Tully attended the Trigger lecture today which had good tips on staying healthy with COPD by managing triggers in the home and outdoors with pollen and air pollution.    Improve shortness of breath with ADL's (read-only)   Comments    Jeffrey Bessinger has completed 2 weeks of exercise and has not noticed an improvement in his shortness of breath. As he progresses with his exercise, I am hoping he will have some change to his breathing.     Breathing Techniques (read-only)   Comments    PLB is a hard technique for Jeffrey Shankman. I recommended he take a breath in through his mouth instead of his nose and then blow out of his mouth with pursed lips.    Increase knowledge of respiratory medications (read-only)   Goals Progress/Improvement seen    Yes     Comments   Jeffrey Mahoney carries his Poventil inhaler with him and did use it before [name] and after the treadmill. His exercise goal did increase to and 1.52mph.     Diabetes (read-only)   Goal  (p) Blood glucose control identified by blood glucose values, HgbA1C. Participant verbalizes understanding of the signs/symptoms of hyper/hypo glycemia, proper foot care and importance of medication and nutrition plan for blood glucose control.        03/19/15 1502 03/24/15 1205 03/28/15 1253 04/01/15 0651 04/11/15 1215   Weight Management (read-only)   Goals Progress/Improvement seen     Yes   Comments     Manu has lost 8lbs just not feeling like eating the past 10 days has seen MD and antibiotic changed etc.    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  Yasser has been making impressive improvments on all the machines during his exercise time. He can exercise continuously for the entire 55 minutes allotted for aerobic exercise. For the rest of his time in the program we will continue to manipulate his intensity in order to keep him moving forward with progression.     Quit Smoking (read-only)   Goals Progress/Improvement seen Yes    Yes   Comments No cigarrettes since his quit date.       Understand more about Heart/Pulmonary Disease (read-only)   Goals Progress/Improvement seen      Yes   Comments     Siddiq said he has recently had more problems with his lungs and MD increased his steroids.    Improve shortness of breath with ADL's (read-only)   Goals Progress/Improvement seen   Yes  Yes   Comments  Feels like his SOB is steadily improving, however, no large gains noted.   Santosh said he can tell he hasn't been able to exerise the past 10 days.   Breathing Techniques (read-only)   Goals Progress/Improvement seen   Yes      Comments  Dhruv has been attempting to use correct  breathing techniques, however, does forget sometimes and notices when SOB increases.  Is trying to work on this daily.      Increase knowledge of respiratory medications (read-only)   Goals Progress/Improvement seen      Yes   Comments     Tylique said he is still on his 3 inhalers and has no questions.    Diabetes (read-only)   Progress seen towards goals   Yes  Yes   Comments   Marquest  reports his blood sugar 3 days ago 3 hours after a meal was good at 138.   Blood sugar in the 200 range today since his steroids were increased.    Hypertension (read-only)   Progress seen toward goals   Yes  Yes   Comments   Terrace reports the MD recently increased his or changed his medicine for his blood  pressure. Systolic in 140's upon arrival.   Blood pressure is very stable and good.      05/30/15 1311 06/23/15 1130 07/07/15 0834       Core Components/Risk  Factors/Patient Goals Review   Personal Goals Review Develop more efficient breathing techniques such as purse lipped breathing and diaphragmatic breathing and practicing self-pacing with activity. Sedentary;Increase Strength and Stamina;Improve shortness of breath with ADL's;Develop more efficient breathing techniques such as purse lipped breathing and diaphragmatic breathing and practicing self-pacing with activity.;Increase knowledge of respiratory medications and ability to use respiratory devices properly. Increase Strength and Stamina;Develop more efficient breathing techniques such as purse lipped breathing and diaphragmatic breathing and practicing self-pacing with activity.;Increase knowledge of respiratory medications and ability to use respiratory devices properly.     Review Reviewed PLB while exercising Jeffrey Deberg has made improvements in is stamina and strength. 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 At home, he has an Elliptical, treadmill, and biodes bike which he plans to continuee as his exercise plan for 2-3 days/week . His daughter and wife plan toexercise with him. In [name] today, we started Jeffrey Branon on the EL and will continue it until he graduates. He has a good understanding of his MDI's and uses PLB independently with activity. is exercise on Jeffrey Curren graduates today from [employer]. He has improved his stamina and endurance; he is more active in his daily life. His post improved by 386ft, 42% - minimal importance difference for COPD is 98.68ft. He has gained knowledge about his MDI' and uses PLB. He plans to continue exercise at home on his home equipment  with his wife and daughter  as part of his daily management of his COPD.     Expected Outcomes This will help relieve SOB while exercising. Continue exercising after graduation and continue managing his COPD daily          Nutrition & Weight  - Outcomes:     Pre Biometrics - 02/18/15 1819    Pre Biometrics   Height 5\' 9"  (1.753 m)   Weight 261 lb 4.8 oz (118.525 kg)   Waist Circumference 48 inches   Hip  Circumference 53.5 inches   Waist to Hip Ratio 0.9 %   BMI (Calculated) 38.7       Nutrition:     Nutrition Therapy & Goals - 02/18/15 1340    Nutrition Therapy   Diet Jeffrey Mackie is interested in meeting the dietitian. His wife does all the cooking - they like vegtables and salads. He does not eat much sweets, but he does not watch his portion sizes.      Nutrition Discharge:   Education Questionnaire Score:     Knowledge Questionnaire Score - 07/07/15 0947    Knowledge Questionnaire Score   Post Score 10/10      Goals reviewed with patient; copy given to patient.

## 2017-03-14 ENCOUNTER — Encounter: Payer: Non-veteran care | Attending: General Practice | Admitting: *Deleted

## 2017-03-14 ENCOUNTER — Encounter: Payer: Self-pay | Admitting: *Deleted

## 2017-03-14 VITALS — Ht 68.9 in | Wt 254.9 lb

## 2017-03-14 DIAGNOSIS — I208 Other forms of angina pectoris: Secondary | ICD-10-CM | POA: Diagnosis present

## 2017-03-14 DIAGNOSIS — F17210 Nicotine dependence, cigarettes, uncomplicated: Secondary | ICD-10-CM | POA: Diagnosis not present

## 2017-03-14 DIAGNOSIS — Z720 Tobacco use: Secondary | ICD-10-CM

## 2017-03-14 DIAGNOSIS — M069 Rheumatoid arthritis, unspecified: Secondary | ICD-10-CM | POA: Diagnosis not present

## 2017-03-14 DIAGNOSIS — Z7982 Long term (current) use of aspirin: Secondary | ICD-10-CM | POA: Insufficient documentation

## 2017-03-14 DIAGNOSIS — Z9989 Dependence on other enabling machines and devices: Secondary | ICD-10-CM

## 2017-03-14 DIAGNOSIS — I252 Old myocardial infarction: Secondary | ICD-10-CM | POA: Insufficient documentation

## 2017-03-14 DIAGNOSIS — E785 Hyperlipidemia, unspecified: Secondary | ICD-10-CM | POA: Diagnosis not present

## 2017-03-14 DIAGNOSIS — I2510 Atherosclerotic heart disease of native coronary artery without angina pectoris: Secondary | ICD-10-CM | POA: Insufficient documentation

## 2017-03-14 DIAGNOSIS — G4733 Obstructive sleep apnea (adult) (pediatric): Secondary | ICD-10-CM | POA: Insufficient documentation

## 2017-03-14 DIAGNOSIS — I10 Essential (primary) hypertension: Secondary | ICD-10-CM | POA: Diagnosis not present

## 2017-03-14 DIAGNOSIS — Z794 Long term (current) use of insulin: Secondary | ICD-10-CM | POA: Insufficient documentation

## 2017-03-14 DIAGNOSIS — J45909 Unspecified asthma, uncomplicated: Secondary | ICD-10-CM | POA: Diagnosis not present

## 2017-03-14 DIAGNOSIS — I25118 Atherosclerotic heart disease of native coronary artery with other forms of angina pectoris: Secondary | ICD-10-CM

## 2017-03-14 DIAGNOSIS — Z79899 Other long term (current) drug therapy: Secondary | ICD-10-CM | POA: Insufficient documentation

## 2017-03-14 DIAGNOSIS — E119 Type 2 diabetes mellitus without complications: Secondary | ICD-10-CM | POA: Diagnosis not present

## 2017-03-14 NOTE — Progress Notes (Signed)
Cardiac Individual Treatment Plan  Patient Details  Name: Jeffrey Burgess MRN: [id number] Date of Birth: 03/28/1945 Referring Provider:     Cardiac Rehab from 03/14/2017 in [facility] Cardiac and Pulmonary Rehab  Referring Provider  Haskel Khan MD  [facility]      Initial Encounter Date:    Cardiac Rehab from 03/14/2017 in [facility] Cardiac and Pulmonary Rehab  Date  03/14/17  Referring Provider  Haskel Khan MD  [facility]      Visit Diagnosis: Stable angina (HCC)  Hypertension, unspecified type  Coronary artery disease of native heart with stable angina pectoris, unspecified vessel or lesion type (HCC)  OSA on CPAP  Tobacco abuse  Dyslipidemia  Patient's Home Medications on Admission:  Current Outpatient Medications:  .  benzonatate (TESSALON) 200 MG capsule, Take 200 mg by mouth 3 (three) times daily as needed for cough., Disp: , Rfl:  .  busPIRone (BUSPAR) 10 MG tablet, Take 10 mg by mouth 3 (three) times daily. , Disp: , Rfl:  .  cetirizine (ZYRTEC) 10 MG tablet, Take 10 mg by mouth daily., Disp: , Rfl:  .  Cholecalciferol (VITAMIN D3 SUPER STRENGTH) 2000 units TABS, Take 2,000 Units by mouth., Disp: , Rfl:  .  docusate sodium (STOOL SOFTENER) 100 MG capsule, Take 100 mg by mouth., Disp: , Rfl:  .  fluticasone (FLONASE) 50 MCG/ACT nasal spray, Place 1 spray into the nose., Disp: , Rfl:  .  glucose blood test strip, 1 each by Other route as needed for other. Use as instructed, Disp: , Rfl:  .  lisinopril (PRINIVIL,ZESTRIL) 10 MG tablet, Take 10 mg by mouth daily., Disp: , Rfl:  .  metFORMIN (GLUCOPHAGE) 500 MG tablet, Take 500 mg by mouth 2 (two) times daily after a meal. , Disp: , Rfl:  .  metoprolol succinate (TOPROL-XL) 100 MG 24 hr tablet, Take 100 mg by mouth daily. Take with or immediately following a meal., Disp: , Rfl:  .  montelukast (SINGULAIR) 10 MG tablet, Take 10 mg by mouth., Disp: , Rfl:  .  nicotine polacrilex (COMMIT) 2 MG lozenge, Place inside cheek., Disp: ,  Rfl:  .  pramipexole (MIRAPEX) 0.5 MG tablet, Take 0.5 mg by mouth., Disp: , Rfl:  .  pregabalin (LYRICA) 100 MG capsule, Take 200 mg by mouth every morning. , Disp: , Rfl:  .  ranitidine (ZANTAC) 150 MG capsule, Take 150 mg by mouth 2 (two) times daily. , Disp: , Rfl:  .  rosuvastatin (CRESTOR) 20 MG tablet, Take 20 mg by mouth., Disp: , Rfl:  .  tiotropium (SPIRIVA) 18 MCG inhalation capsule, Place 18 mcg into inhaler and inhale., Disp: , Rfl:  .  vitamin E 400 UNIT capsule, Take 400 Units by mouth., Disp: , Rfl:  .  acetaminophen (TYLENOL) 500 MG tablet, Take 500 mg by mouth., Disp: , Rfl:  .  albuterol (PROAIR HFA) 108 (90 Base) MCG/ACT inhaler, Inhale 2 puffs into the lungs., Disp: , Rfl:  .  aspirin EC 81 MG tablet, Take 81 mg by mouth., Disp: , Rfl:  .  beclomethasone (BECONASE-AQ) 42 MCG/SPRAY nasal spray, Place 2 sprays into the nose., Disp: , Rfl:  .  budesonide-formoterol (SYMBICORT) 160-4.5 MCG/ACT inhaler, Inhale 2 puffs into the lungs., Disp: , Rfl:  .  buPROPion (WELLBUTRIN) 100 MG tablet, Take 100 mg by mouth., Disp: , Rfl:  .  insulin aspart protamine - aspart (NOVOLOG 70/30 MIX) (70-30) 100 UNIT/ML FlexPen, Inject 18 Units into the skin., Disp: ,  Rfl:  .  metoprolol tartrate (LOPRESSOR) 25 MG tablet, Take 25 mg by mouth., Disp: , Rfl:  .  nitroGLYCERIN (NITROSTAT) 0.4 MG SL tablet, Place 0.4 mg under the tongue., Disp: , Rfl:  .  Omega-3 1000 MG CAPS, Take 2 g by mouth., Disp: , Rfl:  .  VITAMIN B COMPLEX-C CAPS, Take 1 tablet by mouth., Disp: , Rfl:   Past Medical History: Past Medical History:  Diagnosis Date  . Asthma   . Diabetes (HCC)   . Hyperlipidemia   . Hypertension   . Myocardial infarction (HCC)   . Rheumatoid arthritis (HCC)   . Sleep apnea     Tobacco Use: Social History   Tobacco Use  Smoking Status Current Every Day Smoker  . Packs/day: 0.25  . Years: 57.00  . Pack years: 14.25  . Types: Cigarettes  Smokeless Tobacco Former Neurosurgeon  Tobacco  Comment   reports smoking 1 cig each morning.    Labs: Recent Review Flowsheet Data    There is no flowsheet data to display.       Exercise Target Goals: Date: 03/14/17  Exercise Program Goal: Individual exercise prescription set using results from initial 6 min walk test and THRR while considering  patient's activity barriers and safety.   Exercise Prescription Goal: Initial exercise prescription builds to 30-45 minutes a day of aerobic activity, 2-3 days per week.  Home exercise guidelines will be given to patient during program as part of exercise prescription that the participant will acknowledge.  Activity Barriers & Risk Stratification: Activity Barriers & Cardiac Risk Stratification - 03/14/17 1436      Activity Barriers & Cardiac Risk Stratification   Activity Barriers  Arthritis;Back Problems;Shortness of Breath;Muscular Weakness    Cardiac Risk Stratification  Moderate       6 Minute Walk: 6 Minute Walk    Row Name 03/14/17 1427         6 Minute Walk   Phase  Initial     Distance  902 feet     Walk Time  6 minutes     # of Rest Breaks  0     MPH  1.71     METS  2.05     RPE  13     Perceived Dyspnea   2     VO2 Peak  7.17     Symptoms  Yes (comment)     Comments  SOB     Resting HR  67 bpm     Resting BP  126/64     Resting Oxygen Saturation   98 %     Exercise Oxygen Saturation  during 6 min walk  95 %     Max Ex. HR  104 bpm     Max Ex. BP  176/84     2 Minute Post BP  132/76        Oxygen Initial Assessment:   Oxygen Re-Evaluation:   Oxygen Discharge (Final Oxygen Re-Evaluation):   Initial Exercise Prescription: Initial Exercise Prescription - 03/14/17 1400      Date of Initial Exercise RX and Referring Provider   Date  03/14/17    Referring Provider  Haskel Khan MD  [facility]      Treadmill   MPH  1.4    Grade  0.5    Minutes  15    METs  2.17      Arm Ergometer   Level  1    Watts  38  RPM  25    Minutes  15     METs  2      T5 Nustep   Level  1    SPM  80    Minutes  15    METs  2      Prescription Details   Frequency (times per week)  3    Duration  Progress to 45 minutes of aerobic exercise without signs/symptoms of physical distress      Intensity   THRR 40-80% of Max Heartrate  101-134    Ratings of Perceived Exertion  11-13    Perceived Dyspnea  0-4      Progression   Progression  Continue to progress workloads to maintain intensity without signs/symptoms of physical distress.      Resistance Training   Training Prescription  Yes    Weight  3 lbs    Reps  10-15       Perform Capillary Blood Glucose checks as needed.  Exercise Prescription Changes: Exercise Prescription Changes    Row Name 03/14/17 1400             Response to Exercise   Blood Pressure (Admit)  126/64       Blood Pressure (Exercise)  176/84       Blood Pressure (Exit)  132/76       Heart Rate (Admit)  67 bpm       Heart Rate (Exercise)  104 bpm       Heart Rate (Exit)  75 bpm       Oxygen Saturation (Admit)  98 %       Oxygen Saturation (Exercise)  95 %       Rating of Perceived Exertion (Exercise)  13       Perceived Dyspnea (Exercise)  2       Symptoms  SOB       Comments  walk test results          Exercise Comments:   Exercise Goals and Review: Exercise Goals    Row Name 03/14/17 1443             Exercise Goals   Increase Physical Activity  Yes       Intervention  Provide advice, education, support and counseling about physical activity/exercise needs.;Develop an individualized exercise prescription for aerobic and resistive training based on initial evaluation findings, risk stratification, comorbidities and participant's personal goals.       Expected Outcomes  Short Term: Attend rehab on a regular basis to increase amount of physical activity.;Long Term: Add in home exercise to make exercise part of routine and to increase amount of physical activity.;Long Term: Exercising  regularly at least 3-5 days a week.       Increase Strength and Stamina  Yes       Intervention  Provide advice, education, support and counseling about physical activity/exercise needs.;Develop an individualized exercise prescription for aerobic and resistive training based on initial evaluation findings, risk stratification, comorbidities and participant's personal goals.       Expected Outcomes  Short Term: Increase workloads from initial exercise prescription for resistance, speed, and METs.;Short Term: Perform resistance training exercises routinely during rehab and add in resistance training at home;Long Term: Improve cardiorespiratory fitness, muscular endurance and strength as measured by increased METs and functional capacity ( )       Able to understand and use rate of perceived exertion (RPE) scale  Yes       Intervention  Provide education and explanation on how to use RPE scale       Expected Outcomes  Short Term: Able to use RPE daily in rehab to express subjective intensity level;Long Term:  Able to use RPE to guide intensity level when exercising independently       Able to understand and use Dyspnea scale  Yes       Intervention  Provide education and explanation on how to use Dyspnea scale       Expected Outcomes  Short Term: Able to use Dyspnea scale daily in rehab to express subjective sense of shortness of breath during exertion;Long Term: Able to use Dyspnea scale to guide intensity level when exercising independently       Knowledge and understanding of Target Heart Rate Range (THRR)  Yes       Intervention  Provide education and explanation of THRR including how the numbers were predicted and where they are located for reference       Expected Outcomes  Short Term: Able to state/look up THRR;Long Term: Able to use THRR to govern intensity when exercising independently;Short Term: Able to use daily as guideline for intensity in rehab       Able to check pulse independently  Yes        Intervention  Provide education and demonstration on how to check pulse in carotid and radial arteries.;Review the importance of being able to check your own pulse for safety during independent exercise       Expected Outcomes  Short Term: Able to explain why pulse checking is important during independent exercise;Long Term: Able to check pulse independently and accurately       Understanding of Exercise Prescription  Yes       Intervention  Provide education, explanation, and written materials on patient's individual exercise prescription       Expected Outcomes  Short Term: Able to explain program exercise prescription;Long Term: Able to explain home exercise prescription to exercise independently          Exercise Goals Re-Evaluation :   Discharge Exercise Prescription (Final Exercise Prescription Changes): Exercise Prescription Changes - 03/14/17 1400      Response to Exercise   Blood Pressure (Admit)  126/64    Blood Pressure (Exercise)  176/84    Blood Pressure (Exit)  132/76    Heart Rate (Admit)  67 bpm    Heart Rate (Exercise)  104 bpm    Heart Rate (Exit)  75 bpm    Oxygen Saturation (Admit)  98 %    Oxygen Saturation (Exercise)  95 %    Rating of Perceived Exertion (Exercise)  13    Perceived Dyspnea (Exercise)  2    Symptoms  SOB    Comments  walk test results       Nutrition:  Target Goals: Understanding of nutrition guidelines, daily intake of sodium 1500mg , cholesterol 200mg , calories 30% from fat and 7% or less from saturated fats, daily to have 5 or more servings of fruits and vegetables.  Biometrics: Pre Biometrics - 03/14/17 1444      Pre Biometrics   Height  5' 8.9" (1.75 m)    Weight  254 lb 14.4 oz (115.6 kg)    Waist Circumference  48 inches    Hip Circumference  48 inches    Waist to Hip Ratio  1 %    BMI (Calculated)  37.75    Single Leg Stand  3.34 seconds  Nutrition Therapy Plan and Nutrition Goals: Nutrition Therapy & Goals -  03/14/17 1432      Intervention Plan   Intervention  Prescribe, educate and counsel regarding individualized specific dietary modifications aiming towards targeted core components such as weight, hypertension, lipid management, diabetes, heart failure and other comorbidities.;Nutrition handout(s) given to patient.    Expected Outcomes  Short Term Goal: Understand basic principles of dietary content, such as calories, fat, sodium, cholesterol and nutrients.;Short Term Goal: A plan has been developed with personal nutrition goals set during dietitian appointment.;Long Term Goal: Adherence to prescribed nutrition plan.       Nutrition Assessments: Nutrition Assessments - 03/14/17 1432      MEDFICTS Scores   Pre Score  40       Nutrition Goals Re-Evaluation:   Nutrition Goals Discharge (Final Nutrition Goals Re-Evaluation):   Psychosocial: Target Goals: Acknowledge presence or absence of significant depression and/or stress, maximize coping skills, provide positive support system. Participant is able to verbalize types and ability to use techniques and skills needed for reducing stress and depression.   Initial Review & Psychosocial Screening: Initial Psych Review & Screening - 03/14/17 1433      Initial Review   Current issues with  Current Sleep Concerns He and his wife have been having issues sleeping for the last 4 months      Family Dynamics   Good Support System?  Yes      Screening Interventions   Interventions  Encouraged to exercise;Program counselor consult    Expected Outcomes  Short Term goal: Utilizing psychosocial counselor, staff and physician to assist with identification of specific Stressors or current issues interfering with healing process. Setting desired goal for each stressor or current issue identified.;Long Term Goal: Stressors or current issues are controlled or eliminated.;Short Term goal: Identification and review with participant of any Quality of Life or  Depression concerns found by scoring the questionnaire.;Long Term goal: The participant improves quality of Life and PHQ9 Scores as seen by post scores and/or verbalization of changes       Quality of Life Scores:  Quality of Life - 03/14/17 1434      Quality of Life Scores   Health/Function Pre  20.53 %    Socioeconomic Pre  21 %    Psych/Spiritual Pre  21 %    Family Pre  21 %    GLOBAL Pre  20.53 %      Scores of 19 and below usually indicate a poorer quality of life in these areas.  A difference of  2-3 points is a clinically meaningful difference.  A difference of 2-3 points in the total score of the Quality of Life Index has been associated with significant improvement in overall quality of life, self-image, physical symptoms, and general health in studies assessing change in quality of life.  PHQ-9: Recent Review Flowsheet Data    Depression screen [facility] 2/9 03/14/2017 07/04/2015 06/27/2015 02/18/2015   Decreased Interest 0 0 0 0   Down, Depressed, Hopeless 0 0 0 0   PHQ - 2 Score 0 0 0 0   Altered sleeping 1 0 0 -   Tired, decreased energy 2 1 1  -   Change in appetite 0 0 0 -   Feeling bad or failure about yourself  0 1 1 -   Trouble concentrating 0 0 0 -   Moving slowly or fidgety/restless 0 0 0 -   Suicidal thoughts 0 0 0 -   PHQ-9 Score  3 2 2  -   Difficult doing work/chores Not difficult at all Not difficult at all Not difficult at all -     Interpretation of Total Score  Total Score Depression Severity:  1-4 = Minimal depression, 5-9 = Mild depression, 10-14 = Moderate depression, 15-19 = Moderately severe depression, 20-27 = Severe depression   Psychosocial Evaluation and Intervention:   Psychosocial Re-Evaluation:   Psychosocial Discharge (Final Psychosocial Re-Evaluation):   Vocational Rehabilitation: Provide vocational rehab assistance to qualifying candidates.   Vocational Rehab Evaluation & Intervention: Vocational Rehab - 03/14/17 1435      Initial  Vocational Rehab Evaluation & Intervention   Assessment shows need for Vocational Rehabilitation  No       Education: Education Goals: Education classes will be provided on a variety of topics geared toward better understanding of heart health and risk factor modification. Participant will state understanding/return demonstration of topics presented as noted by education test scores.  Learning Barriers/Preferences: Learning Barriers/Preferences - 03/14/17 1434      Learning Barriers/Preferences   Learning Barriers  Hearing;Exercise Concerns    Learning Preferences  None       Education Topics: General Nutrition Guidelines/Fats and Fiber: -Group instruction provided by verbal, written material, models and posters to present the general guidelines for heart healthy nutrition. Gives an explanation and review of dietary fats and fiber.   Pulmonary Rehab from 07/02/2015 in [facility] Cardiac and Pulmonary Rehab  Date  03/31/15  Educator  C. Benita Gutter, RD  Instruction Review Code (retired)  2- meets goals/outcomes      Controlling Sodium/Reading Food Labels: -Group verbal and written material supporting the discussion of sodium use in heart healthy nutrition. Review and explanation with models, verbal and written materials for utilization of the food label.   Pulmonary Rehab from 07/02/2015 in [facility] Cardiac and Pulmonary Rehab  Date  04/14/15  Educator  LB  Instruction Review Code (retired)  2- meets goals/outcomes      Exercise Physiology & Risk Factors: - Group verbal and written instruction with models to review the exercise physiology of the cardiovascular system and associated critical values. Details cardiovascular disease risk factors and the goals associated with each risk factor.   Aerobic Exercise & Resistance Training: - Gives group verbal and written discussion on the health impact of inactivity. On the components of aerobic and resistive training programs and the benefits of this  training and how to safely progress through these programs.   Pulmonary Rehab from 07/02/2015 in [facility] Cardiac and Pulmonary Rehab  Date  03/19/15  Educator  SWay  Instruction Review Code (retired)  2- Biomedical scientist, Balance, General Exercise Guidelines: - Provides group verbal and written instruction on the benefits of flexibility and balance training programs. Provides general exercise guidelines with specific guidelines to those with heart or lung disease. Demonstration and skill practice provided.   Pulmonary Rehab from 07/02/2015 in [facility] Cardiac and Pulmonary Rehab  Date  07/02/15  Educator  Rhett Bannister, PT  Instruction Review Code (retired)  2- meets goals/outcomes      Stress Management: - Provides group verbal and written instruction about the health risks of elevated stress, cause of high stress, and healthy ways to reduce stress.   Pulmonary Rehab from 07/02/2015 in [facility] Cardiac and Pulmonary Rehab  Date  04/16/15  Educator  [facility]  Instruction Review Code (retired)  2- meets goals/outcomes      Depression: - Provides group verbal and written instruction on  the correlation between heart/lung disease and depressed mood, treatment options, and the stigmas associated with seeking treatment.   Pulmonary Rehab from 07/02/2015 in [facility] Cardiac and Pulmonary Rehab  Date  05/28/15  Educator  Heloise Ochoa  Instruction Review Code (retired)  2- meets Building services engineer & Physiology of the Heart: - Group verbal and written instruction and models provide basic cardiac anatomy and physiology, with the coronary electrical and arterial systems. Review of: AMI, Angina, Valve disease, Heart Failure, Cardiac Arrhythmia, Pacemakers, and the ICD.   Pulmonary Rehab from 07/02/2015 in [facility] Cardiac and Pulmonary Rehab  Date  02/28/15  Educator  CE  Instruction Review Code (retired)  2- meets goals/outcomes      Cardiac Procedures: - Group verbal and written  instruction to review commonly prescribed medications for heart disease. Reviews the medication, class of the drug, and side effects. Includes the steps to properly store meds and maintain the prescription regimen. (beta blockers and nitrates)   Cardiac Medications I: - Group verbal and written instruction to review commonly prescribed medications for heart disease. Reviews the medication, class of the drug, and side effects. Includes the steps to properly store meds and maintain the prescription regimen.   Pulmonary Rehab from 07/02/2015 in [facility] Cardiac and Pulmonary Rehab  Date  06/27/15  Educator  CE  Instruction Review Code (retired)  2- meets goals/outcomes      Cardiac Medications II: -Group verbal and written instruction to review commonly prescribed medications for heart disease. Reviews the medication, class of the drug, and side effects. (all other drug classes)    Go Sex-Intimacy & Heart Disease, Get SMART - Goal Setting: - Group verbal and written instruction through game format to discuss heart disease and the return to sexual intimacy. Provides group verbal and written material to discuss and apply goal setting through the application of the S.M.A.R.T. Method.   Other Matters of the Heart: - Provides group verbal, written materials and models to describe Heart Failure, Angina, Valve Disease, Peripheral Artery Disease, and Diabetes in the realm of heart disease. Includes description of the disease process and treatment options available to the cardiac patient.   Exercise & Equipment Safety: - Individual verbal instruction and demonstration of equipment use and safety with use of the equipment.   Cardiac Rehab from 03/14/2017 in [facility] Cardiac and Pulmonary Rehab  Date  03/14/17  Educator  [facility]  Instruction Review Code  1- Verbalizes Understanding      Infection Prevention: - Provides verbal and written material to individual with discussion of infection control including  proper hand washing and proper equipment cleaning during exercise session.   Cardiac Rehab from 03/14/2017 in [facility] Cardiac and Pulmonary Rehab  Date  03/14/17  Educator  [facility]  Instruction Review Code  1- Verbalizes Understanding      Falls Prevention: - Provides verbal and written material to individual with discussion of falls prevention and safety.   Cardiac Rehab from 03/14/2017 in [facility] Cardiac and Pulmonary Rehab  Date  03/14/17  Educator  [facility]  Instruction Review Code  1- Verbalizes Understanding      Diabetes: - Individual verbal and written instruction to review signs/symptoms of diabetes, desired ranges of glucose level fasting, after meals and with exercise. Acknowledge that pre and post exercise glucose checks will be done for 3 sessions at entry of program.   Cardiac Rehab from 03/14/2017 in [facility] Cardiac and Pulmonary Rehab  Date  03/14/17  Educator  [facility]  Instruction Review Code  1- Verbalizes Understanding      Other: -Provides group and verbal instruction on various topics (see comments)    Knowledge Questionnaire Score: Knowledge Questionnaire Score - 03/14/17 1435      Knowledge Questionnaire Score   Pre Score  25/28 correct answers reviewed with Dedra Skeens       Core Components/Risk Factors/Patient Goals at Admission: Personal Goals and Risk Factors at Admission - 03/14/17 1423      Core Components/Risk Factors/Patient Goals on Admission    Weight Management  Yes;Obesity;Weight Loss    Intervention  Weight Management: Develop a combined nutrition and exercise program designed to reach desired caloric intake, while maintaining appropriate intake of nutrient and fiber, sodium and fats, and appropriate energy expenditure required for the weight goal.;Weight Management: Provide education and appropriate resources to help participant work on and attain dietary goals.;Weight Management/Obesity: Establish reasonable short term and long term weight goals.;Obesity: Provide  education and appropriate resources to help participant work on and attain dietary goals.    Admit Weight  254 lb 14.4 oz (115.6 kg)    Goal Weight: Short Term  250 lb (113.4 kg)    Goal Weight: Long Term  210 lb (95.3 kg)    Expected Outcomes  Short Term: Continue to assess and modify interventions until short term weight is achieved;Long Term: Adherence to nutrition and physical activity/exercise program aimed toward attainment of established weight goal;Weight Loss: Understanding of general recommendations for a balanced deficit meal plan, which promotes 1-2 lb weight loss per week and includes a negative energy balance of [phone] kcal/d;Understanding of distribution of calorie intake throughout the day with the consumption of 4-5 meals/snacks;Understanding recommendations for meals to include 15-35% energy as protein, 25-35% energy from fat, 35-60% energy from carbohydrates, less than 200mg  of dietary cholesterol, 20-35 gm of total fiber daily    Tobacco Cessation  Yes    Number of packs per day  1 cig each morning    Intervention  Assist the participant in steps to quit. Provide individualized education and counseling about committing to Tobacco Cessation, relapse prevention, and pharmacological support that can be provided by physician.;Education officer, environmental, assist with locating and accessing local/national Quit Smoking programs, and support quit date choice.    Expected Outcomes  Short Term: Will demonstrate readiness to quit, by selecting a quit date.;Long Term: Complete abstinence from all tobacco products for at least 12 months from quit date.;Short Term: Will quit all tobacco product use, adhering to prevention of relapse plan.    Diabetes  Yes    Intervention  Provide education about signs/symptoms and action to take for hypo/hyperglycemia.;Provide education about proper nutrition, including hydration, and aerobic/resistive exercise prescription along with prescribed medications to  achieve blood glucose in normal ranges: Fasting glucose 65-99 mg/dL    Expected Outcomes  Short Term: Participant verbalizes understanding of the signs/symptoms and immediate care of hyper/hypoglycemia, proper foot care and importance of medication, aerobic/resistive exercise and nutrition plan for blood glucose control.;Long Term: Attainment of HbA1C < 7%.    Hypertension  Yes    Intervention  Provide education on lifestyle modifcations including regular physical activity/exercise, weight management, moderate sodium restriction and increased consumption of fresh fruit, vegetables, and low fat dairy, alcohol moderation, and smoking cessation.;Monitor prescription use compliance.    Expected Outcomes  Short Term: Continued assessment and intervention until BP is < 140/45mm HG in hypertensive participants. < 130/39mm HG in hypertensive participants with diabetes, heart failure or chronic kidney disease.;Long Term:  Maintenance of blood pressure at goal levels.    Lipids  Yes    Intervention  Provide education and support for participant on nutrition & aerobic/resistive exercise along with prescribed medications to achieve LDL 70mg , HDL >40mg .    Expected Outcomes  Short Term: Participant states understanding of desired cholesterol values and is compliant with medications prescribed. Participant is following exercise prescription and nutrition guidelines.;Long Term: Cholesterol controlled with medications as prescribed, with individualized exercise RX and with personalized nutrition plan. Value goals: LDL < 70mg , HDL > 40 mg.       Core Components/Risk Factors/Patient Goals Review:    Core Components/Risk Factors/Patient Goals at Discharge (Final Review):    ITP Comments: ITP Comments    Row Name 03/14/17 1400           ITP Comments  Med review completed. Initial ITP created. Diagnosis can be found in [facility] from [place] 12/03/16          Comments: Initial ITP

## 2017-03-14 NOTE — Patient Instructions (Signed)
Patient Instructions  Patient Details  Name: Jeffrey Burgess MRN: [id number] Date of Birth: April 21, 1945 Referring Provider:  [facility]  Below are your personal goals for exercise, nutrition, and risk factors. Our goal is to help you stay on track towards obtaining and maintaining these goals. We will be discussing your progress on these goals with you throughout the program.  Initial Exercise Prescription: Initial Exercise Prescription - 03/14/17 1400      Date of Initial Exercise RX and Referring Provider   Date  03/14/17    Referring Provider  Haskel Khan MD  [facility]      Treadmill   MPH  1.4    Grade  0.5    Minutes  15    METs  2.17      Arm Ergometer   Level  1    Watts  38    RPM  25    Minutes  15    METs  2      T5 Nustep   Level  1    SPM  80    Minutes  15    METs  2      Prescription Details   Frequency (times per week)  3    Duration  Progress to 45 minutes of aerobic exercise without signs/symptoms of physical distress      Intensity   THRR 40-80% of Max Heartrate  101-134    Ratings of Perceived Exertion  11-13    Perceived Dyspnea  0-4      Progression   Progression  Continue to progress workloads to maintain intensity without signs/symptoms of physical distress.      Resistance Training   Training Prescription  Yes    Weight  3 lbs    Reps  10-15       Exercise Goals: Frequency: Be able to perform aerobic exercise two to three times per week in program working toward 2-5 days per week of home exercise.  Intensity: Work with a perceived exertion of 11 (fairly light) - 15 (hard) while following your exercise prescription.  We will make changes to your prescription with you as you progress through the program.   Duration: Be able to do 30 to 45 minutes of continuous aerobic exercise in addition to a 5 minute warm-up and a 5 minute cool-down routine.   Nutrition Goals: Your personal nutrition goals will be established when you do  your nutrition analysis with the dietician.  The following are general nutrition guidelines to follow: Cholesterol < 200mg /day Sodium < 1500mg /day Fiber: Men over 50 yrs - 30 grams per day  Personal Goals: Personal Goals and Risk Factors at Admission - 03/14/17 1423      Core Components/Risk Factors/Patient Goals on Admission    Weight Management  Yes;Obesity;Weight Loss    Intervention  Weight Management: Develop a combined nutrition and exercise program designed to reach desired caloric intake, while maintaining appropriate intake of nutrient and fiber, sodium and fats, and appropriate energy expenditure required for the weight goal.;Weight Management: Provide education and appropriate resources to help participant work on and attain dietary goals.;Weight Management/Obesity: Establish reasonable short term and long term weight goals.;Obesity: Provide education and appropriate resources to help participant work on and attain dietary goals.    Admit Weight  254 lb 14.4 oz (115.6 kg)    Goal Weight: Short Term  250 lb (113.4 kg)    Goal Weight: Long Term  210 lb (95.3 kg)  Expected Outcomes  Short Term: Continue to assess and modify interventions until short term weight is achieved;Long Term: Adherence to nutrition and physical activity/exercise program aimed toward attainment of established weight goal;Weight Loss: Understanding of general recommendations for a balanced deficit meal plan, which promotes 1-2 lb weight loss per week and includes a negative energy balance of [phone] kcal/d;Understanding of distribution of calorie intake throughout the day with the consumption of 4-5 meals/snacks;Understanding recommendations for meals to include 15-35% energy as protein, 25-35% energy from fat, 35-60% energy from carbohydrates, less than 200mg  of dietary cholesterol, 20-35 gm of total fiber daily    Tobacco Cessation  Yes    Number of packs per day  1 cig each morning    Intervention  Assist the  participant in steps to quit. Provide individualized education and counseling about committing to Tobacco Cessation, relapse prevention, and pharmacological support that can be provided by physician.; , assist with locating and accessing local/national Quit Smoking programs, and support quit date choice.    Expected Outcomes  Short Term: Will demonstrate readiness to quit, by selecting a quit date.;Long Term: Complete abstinence from all tobacco products for at least 12 months from quit date.;Short Term: Will quit all tobacco product use, adhering to prevention of relapse plan.    Diabetes  Yes    Intervention  Provide education about signs/symptoms and action to take for hypo/hyperglycemia.;Provide education about proper nutrition, including hydration, and aerobic/resistive exercise prescription along with prescribed medications to achieve blood glucose in normal ranges: Fasting glucose 65-99 mg/dL    Expected Outcomes  Short Term: Participant verbalizes understanding of the signs/symptoms and immediate care of hyper/hypoglycemia, proper foot care and importance of medication, aerobic/resistive exercise and nutrition plan for blood glucose control.;Long Term: Attainment of HbA1C < 7%.    Hypertension  Yes    Intervention  Provide education on lifestyle modifcations including regular physical activity/exercise, weight management, moderate sodium restriction and increased consumption of fresh fruit, vegetables, and low fat dairy, alcohol moderation, and smoking cessation.;Monitor prescription use compliance.    Expected Outcomes  Short Term: Continued assessment and intervention until BP is < 140/2mm HG in hypertensive participants. < 130/76mm HG in hypertensive participants with diabetes, heart failure or chronic kidney disease.;Long Term: Maintenance of blood pressure at goal levels.    Lipids  Yes    Intervention  Provide education and support for participant on nutrition &  aerobic/resistive exercise along with prescribed medications to achieve LDL 70mg , HDL >40mg .    Expected Outcomes  Short Term: Participant states understanding of desired cholesterol values and is compliant with medications prescribed. Participant is following exercise prescription and nutrition guidelines.;Long Term: Cholesterol controlled with medications as prescribed, with individualized exercise RX and with personalized nutrition plan. Value goals: LDL < 70mg , HDL > 40 mg.       Tobacco Use Initial Evaluation: Social History   Tobacco Use  Smoking Status Current Every Day Smoker  . Packs/day: 0.25  . Years: 57.00  . Pack years: 14.25  . Types: Cigarettes  Smokeless Tobacco Former 91m  Tobacco Comment   reports smoking 1 cig each morning.    Exercise Goals and Review: Exercise Goals    Row Name 03/14/17 1443             Exercise Goals   Increase Physical Activity  Yes       Intervention  Provide advice, education, support and counseling about physical activity/exercise needs.;Develop an individualized exercise prescription for aerobic and resistive training  based on initial evaluation findings, risk stratification, comorbidities and participant's personal goals.       Expected Outcomes  Short Term: Attend rehab on a regular basis to increase amount of physical activity.;Long Term: Add in home exercise to make exercise part of routine and to increase amount of physical activity.;Long Term: Exercising regularly at least 3-5 days a week.       Increase Strength and Stamina  Yes       Intervention  Provide advice, education, support and counseling about physical activity/exercise needs.;Develop an individualized exercise prescription for aerobic and resistive training based on initial evaluation findings, risk stratification, comorbidities and participant's personal goals.       Expected Outcomes  Short Term: Increase workloads from initial exercise prescription for resistance,  speed, and METs.;Short Term: Perform resistance training exercises routinely during rehab and add in resistance training at home;Long Term: Improve cardiorespiratory fitness, muscular endurance and strength as measured by increased METs and functional capacity ( )       Able to understand and use rate of perceived exertion (RPE) scale  Yes       Intervention  Provide education and explanation on how to use RPE scale       Expected Outcomes  Short Term: Able to use RPE daily in rehab to express subjective intensity level;Long Term:  Able to use RPE to guide intensity level when exercising independently       Able to understand and use Dyspnea scale  Yes       Intervention  Provide education and explanation on how to use Dyspnea scale       Expected Outcomes  Short Term: Able to use Dyspnea scale daily in rehab to express subjective sense of shortness of breath during exertion;Long Term: Able to use Dyspnea scale to guide intensity level when exercising independently       Knowledge and understanding of Target Heart Rate Range (THRR)  Yes       Intervention  Provide education and explanation of THRR including how the numbers were predicted and where they are located for reference       Expected Outcomes  Short Term: Able to state/look up THRR;Long Term: Able to use THRR to govern intensity when exercising independently;Short Term: Able to use daily as guideline for intensity in rehab       Able to check pulse independently  Yes       Intervention  Provide education and demonstration on how to check pulse in carotid and radial arteries.;Review the importance of being able to check your own pulse for safety during independent exercise       Expected Outcomes  Short Term: Able to explain why pulse checking is important during independent exercise;Long Term: Able to check pulse independently and accurately       Understanding of Exercise Prescription  Yes       Intervention  Provide education,  explanation, and written materials on patient's individual exercise prescription       Expected Outcomes  Short Term: Able to explain program exercise prescription;Long Term: Able to explain home exercise prescription to exercise independently          Copy of goals given to participant.

## 2017-03-23 ENCOUNTER — Encounter: Payer: Self-pay | Admitting: *Deleted

## 2017-03-23 DIAGNOSIS — I208 Other forms of angina pectoris: Secondary | ICD-10-CM

## 2017-03-23 NOTE — Progress Notes (Signed)
Cardiac Individual Treatment Plan  Patient Details  Name: Jeffrey Burgess MRN: [id number] Date of Birth: 1945-05-10 Referring Provider:     Cardiac Rehab from 03/14/2017 in [facility] Cardiac and Pulmonary Rehab  Referring Provider  [name] MD  [name]      Initial Encounter Date:    Cardiac Rehab from 03/14/2017 in [facility] Cardiac and Pulmonary Rehab  Date  03/14/17  Referring Provider  [name] MD  [name]      Visit Diagnosis: Stable angina ([place])  Patient's Home Medications on Admission:  Current Outpatient Medications:  .  acetaminophen (TYLENOL) 500 MG tablet, Take 500 mg by mouth., Disp: , Rfl:  .  albuterol (PROAIR HFA) 108 (90 Base) MCG/ACT inhaler, Inhale 2 puffs into the lungs., Disp: , Rfl:  .  aspirin EC 81 MG tablet, Take 81 mg by mouth., Disp: , Rfl:  .  beclomethasone (BECONASE-AQ) 42 MCG/SPRAY nasal spray, Place 2 sprays into the nose., Disp: , Rfl:  .  benzonatate (TESSALON) 200 MG capsule, Take 200 mg by mouth 3 (three) times daily as needed for cough., Disp: , Rfl:  .  budesonide-formoterol (SYMBICORT) 160-4.5 MCG/ACT inhaler, Inhale 2 puffs into the lungs., Disp: , Rfl:  .  buPROPion (WELLBUTRIN) 100 MG tablet, Take 100 mg by mouth., Disp: , Rfl:  .  busPIRone (BUSPAR) 10 MG tablet, Take 10 mg by mouth 3 (three) times daily. , Disp: , Rfl:  .  cetirizine (ZYRTEC) 10 MG tablet, Take 10 mg by mouth daily., Disp: , Rfl:  .  Cholecalciferol (VITAMIN D3 SUPER STRENGTH) 2000 units TABS, Take 2,000 Units by mouth., Disp: , Rfl:  .  docusate sodium (STOOL SOFTENER) 100 MG capsule, Take 100 mg by mouth., Disp: , Rfl:  .  fluticasone (FLONASE) 50 MCG/ACT nasal spray, Place 1 spray into the nose., Disp: , Rfl:  .  glucose blood test strip, 1 each by Other route as needed for other. Use as instructed, Disp: , Rfl:  .  insulin aspart protamine - aspart (NOVOLOG 70/30 MIX) (70-30) 100 UNIT/ML FlexPen, Inject 18 Units into the skin., Disp: , Rfl:  .  lisinopril  (PRINIVIL,ZESTRIL) 10 MG tablet, Take 10 mg by mouth daily., Disp: , Rfl:  .  metFORMIN (GLUCOPHAGE) 500 MG tablet, Take 500 mg by mouth 2 (two) times daily after a meal. , Disp: , Rfl:  .  metoprolol succinate (TOPROL-XL) 100 MG 24 hr tablet, Take 100 mg by mouth daily. Take with or immediately following a meal., Disp: , Rfl:  .  metoprolol tartrate (LOPRESSOR) 25 MG tablet, Take 25 mg by mouth., Disp: , Rfl:  .  montelukast (SINGULAIR) 10 MG tablet, Take 10 mg by mouth., Disp: , Rfl:  .  nicotine polacrilex (COMMIT) 2 MG lozenge, Place inside cheek., Disp: , Rfl:  .  nitroGLYCERIN (NITROSTAT) 0.4 MG SL tablet, Place 0.4 mg under the tongue., Disp: , Rfl:  .  Omega-3 1000 MG CAPS, Take 2 g by mouth., Disp: , Rfl:  .  pramipexole (MIRAPEX) 0.5 MG tablet, Take 0.5 mg by mouth., Disp: , Rfl:  .  pregabalin (LYRICA) 100 MG capsule, Take 200 mg by mouth every morning. , Disp: , Rfl:  .  ranitidine (ZANTAC) 150 MG capsule, Take 150 mg by mouth 2 (two) times daily. , Disp: , Rfl:  .  rosuvastatin (CRESTOR) 20 MG tablet, Take 20 mg by mouth., Disp: , Rfl:  .  tiotropium (SPIRIVA) 18 MCG inhalation capsule, Place 18 mcg into  inhaler and inhale., Disp: , Rfl:  .  VITAMIN B COMPLEX-C CAPS, Take 1 tablet by mouth., Disp: , Rfl:  .  vitamin E 400 UNIT capsule, Take 400 Units by mouth., Disp: , Rfl:   Past Medical History: Past Medical History:  Diagnosis Date  . Asthma   . Diabetes ([place])   . Hyperlipidemia   . Hypertension   . Myocardial infarction ([place])   . Rheumatoid arthritis ([place])   . Sleep apnea     Tobacco Use: Social History   Tobacco Use  Smoking Status Current Every Day Smoker  . Packs/day: 0.25  . Years: 57.00  . Pack years: 14.25  . Types: Cigarettes  Smokeless Tobacco Former [place]   reports smoking 1 cig each morning.    Labs: Recent Review Flowsheet Data    There is no flowsheet data to display.       Exercise Target Goals:    Exercise Program  Goal: Individual exercise prescription set using results from initial 6 min walk test and THRR while considering  patient's activity barriers and safety.   Exercise Prescription Goal: Initial exercise prescription builds to 30-45 minutes a day of aerobic activity, 2-3 days per week.  Home exercise guidelines will be given to patient during program as part of exercise prescription that the participant will acknowledge.  Activity Barriers & Risk Stratification: Activity Barriers & Cardiac Risk Stratification - 03/14/17 1436      Activity Barriers & Cardiac Risk Stratification   Activity Barriers  Arthritis;Back Problems;Shortness of Breath;Muscular Weakness    Cardiac Risk Stratification  Moderate       6 Minute Walk: 6 Minute Walk    Row Name 03/14/17 1427         6 Minute Walk   Phase  Initial     Distance  902 feet     Walk Time  6 minutes     # of Rest Breaks  0     MPH  1.71     METS  2.05     RPE  13     Perceived Dyspnea   2     VO2 Peak  7.17     Symptoms  Yes (comment)     Comments  SOB     Resting HR  67 bpm     Resting BP  126/64     Resting Oxygen Saturation   98 %     Exercise Oxygen Saturation  during 6 min walk  95 %     Max Ex. HR  104 bpm     Max Ex. BP  176/84     2 Minute Post BP  132/76        Oxygen Initial Assessment:   Oxygen Re-Evaluation:   Oxygen Discharge (Final Oxygen Re-Evaluation):   Initial Exercise Prescription: Initial Exercise Prescription - 03/14/17 1400      Date of Initial Exercise RX and Referring Provider   Date  03/14/17    Referring Provider  [name] MD  [facility]      Treadmill   MPH  1.4    Grade  0.5    Minutes  15    METs  2.17      Arm Ergometer   Level  1    Watts  38    RPM  25    Minutes  15    METs  2      T5 Nustep   Level  1    SPM  80    Minutes  15    METs  2      Prescription Details   Frequency (times per week)  3    Duration  Progress to 45 minutes of aerobic exercise without  signs/symptoms of physical distress      Intensity   THRR 40-80% of Max Heartrate  101-134    Ratings of Perceived Exertion  11-13    Perceived Dyspnea  0-4      Progression   Progression  Continue to progress workloads to maintain intensity without signs/symptoms of physical distress.      Resistance Training   Training Prescription  Yes    Weight  3 lbs    Reps  10-15       Perform Capillary Blood Glucose checks as needed.  Exercise Prescription Changes: Exercise Prescription Changes    Row Name 03/14/17 1400             Response to Exercise   Blood Pressure (Admit)  126/64       Blood Pressure (Exercise)  176/84       Blood Pressure (Exit)  132/76       Heart Rate (Admit)  67 bpm       Heart Rate (Exercise)  104 bpm       Heart Rate (Exit)  75 bpm       Oxygen Saturation (Admit)  98 %       Oxygen Saturation (Exercise)  95 %       Rating of Perceived Exertion (Exercise)  13       Perceived Dyspnea (Exercise)  2       Symptoms  SOB       Comments  walk test results          Exercise Comments:   Exercise Goals and Review: Exercise Goals    Row Name 03/14/17 1443             Exercise Goals   Increase Physical Activity  Yes       Intervention  Provide advice, education, support and counseling about physical activity/exercise needs.;Develop an individualized exercise prescription for aerobic and resistive training based on initial evaluation findings, risk stratification, comorbidities and participant's personal goals.       Expected Outcomes  Short Term: Attend rehab on a regular basis to increase amount of physical activity.;Long Term: Add in home exercise to make exercise part of routine and to increase amount of physical activity.;Long Term: Exercising regularly at least 3-5 days a week.       Increase Strength and Stamina  Yes       Intervention  Provide advice, education, support and counseling about physical activity/exercise needs.;Develop an  individualized exercise prescription for aerobic and resistive training based on initial evaluation findings, risk stratification, comorbidities and participant's personal goals.       Expected Outcomes  Short Term: Increase workloads from initial exercise prescription for resistance, speed, and METs.;Short Term: Perform resistance training exercises routinely during rehab and add in resistance training at home;Long Term: Improve cardiorespiratory fitness, muscular endurance and strength as measured by increased METs and functional capacity (6MWT)       Able to understand and use rate of perceived exertion (RPE) scale  Yes       Intervention  Provide education and explanation on how to use RPE scale       Expected Outcomes  Short Term: Able to use RPE  daily in rehab to express subjective intensity level;Long Term:  Able to use RPE to guide intensity level when exercising independently       Able to understand and use Dyspnea scale  Yes       Intervention  Provide education and explanation on how to use Dyspnea scale       Expected Outcomes  Short Term: Able to use Dyspnea scale daily in rehab to express subjective sense of shortness of breath during exertion;Long Term: Able to use Dyspnea scale to guide intensity level when exercising independently       Knowledge and understanding of Target Heart Rate Range (THRR)  Yes       Intervention  Provide education and explanation of THRR including how the numbers were predicted and where they are located for reference       Expected Outcomes  Short Term: Able to state/look up THRR;Long Term: Able to use THRR to govern intensity when exercising independently;Short Term: Able to use daily as guideline for intensity in rehab       Able to check pulse independently  Yes       Intervention  Provide education and demonstration on how to check pulse in carotid and radial arteries.;Review the importance of being able to check your own pulse for safety during  independent exercise       Expected Outcomes  Short Term: Able to explain why pulse checking is important during independent exercise;Long Term: Able to check pulse independently and accurately       Understanding of Exercise Prescription  Yes       Intervention  Provide education, explanation, and written materials on patient's individual exercise prescription       Expected Outcomes  Short Term: Able to explain program exercise prescription;Long Term: Able to explain home exercise prescription to exercise independently          Exercise Goals Re-Evaluation :   Discharge Exercise Prescription (Final Exercise Prescription Changes): Exercise Prescription Changes - 03/14/17 1400      Response to Exercise   Blood Pressure (Admit)  126/64    Blood Pressure (Exercise)  176/84    Blood Pressure (Exit)  132/76    Heart Rate (Admit)  67 bpm    Heart Rate (Exercise)  104 bpm    Heart Rate (Exit)  75 bpm    Oxygen Saturation (Admit)  98 %    Oxygen Saturation (Exercise)  95 %    Rating of Perceived Exertion (Exercise)  13    Perceived Dyspnea (Exercise)  2    Symptoms  SOB    Comments  walk test results       Nutrition:  Target Goals: Understanding of nutrition guidelines, daily intake of sodium '1500mg'$ , cholesterol '200mg'$ , calories 30% from fat and 7% or less from saturated fats, daily to have 5 or more servings of fruits and vegetables.  Biometrics: Pre Biometrics - 03/14/17 1444      Pre Biometrics   Height  5' 8.9" (1.75 m)    Weight  254 lb 14.4 oz (115.6 kg)    Waist Circumference  48 inches    Hip Circumference  48 inches    Waist to Hip Ratio  1 %    BMI (Calculated)  37.75    Single Leg Stand  3.34 seconds        Nutrition Therapy Plan and Nutrition Goals: Nutrition Therapy & Goals - 03/14/17 1432      Intervention Plan  Intervention  Prescribe, educate and counsel regarding individualized specific dietary modifications aiming towards targeted core components such  as weight, hypertension, lipid management, diabetes, heart failure and other comorbidities.;Nutrition handout(s) given to patient.    Expected Outcomes  Short Term Goal: Understand basic principles of dietary content, such as calories, fat, sodium, cholesterol and nutrients.;Short Term Goal: A plan has been developed with personal nutrition goals set during dietitian appointment.;Long Term Goal: Adherence to prescribed nutrition plan.       Nutrition Assessments: Nutrition Assessments - 03/14/17 1432      MEDFICTS Scores   Pre Score  40       Nutrition Goals Re-Evaluation:   Nutrition Goals Discharge (Final Nutrition Goals Re-Evaluation):   Psychosocial: Target Goals: Acknowledge presence or absence of significant depression and/or stress, maximize coping skills, provide positive support system. Participant is able to verbalize types and ability to use techniques and skills needed for reducing stress and depression.   Initial Review & Psychosocial Screening: Initial Psych Review & Screening - 03/14/17 1433      Initial Review   Current issues with  Current Sleep Concerns He and his wife have been having issues sleeping for the last 4 months      [place]?  Yes      Screening Interventions   Interventions  Encouraged to exercise;Program counselor consult    Expected Outcomes  Short Term goal: Utilizing psychosocial counselor, staff and physician to assist with identification of specific Stressors or current issues interfering with healing process. Setting desired goal for each stressor or current issue identified.;Long Term Goal: Stressors or current issues are controlled or eliminated.;Short Term goal: Identification and review with participant of any Quality of Life or Depression concerns found by scoring the questionnaire.;Long Term goal: The participant improves quality of Life and PHQ9 Scores as seen by post scores and/or verbalization of changes        Quality of Life Scores:  Quality of Life - 03/14/17 1434      Quality of Life Scores   Health/Function Pre  20.53 %    Socioeconomic Pre  21 %    Psych/Spiritual Pre  21 %    Family Pre  21 %    GLOBAL Pre  20.53 %      Scores of 19 and below usually indicate a poorer quality of life in these areas.  A difference of  2-3 points is a clinically meaningful difference.  A difference of 2-3 points in the total score of the Quality of Life Index has been associated with significant improvement in overall quality of life, self-image, physical symptoms, and general health in studies assessing change in quality of life.  PHQ-9: Recent Review Flowsheet Data    Depression screen [facility] 2/9 03/14/2017 07/04/2015 06/27/2015 02/18/2015   Decreased Interest 0 0 0 0   Down, Depressed, Hopeless 0 0 0 0   PHQ - 2 Score 0 0 0 0   Altered sleeping 1 0 0 -   Tired, decreased energy '2 1 1 '$ -   Change in appetite 0 0 0 -   Feeling bad or failure about yourself  0 1 1 -   Trouble concentrating 0 0 0 -   Moving slowly or fidgety/restless 0 0 0 -   Suicidal thoughts 0 0 0 -   PHQ-9 Score '3 2 2 '$ -   Difficult doing work/chores Not difficult at all Not difficult at all Not difficult at all -  Interpretation of Total Score  Total Score Depression Severity:  1-4 = Minimal depression, 5-9 = Mild depression, 10-14 = Moderate depression, 15-19 = Moderately severe depression, 20-27 = Severe depression   Psychosocial Evaluation and Intervention:   Psychosocial Re-Evaluation:   Psychosocial Discharge (Final Psychosocial Re-Evaluation):   Vocational Rehabilitation: Provide vocational rehab assistance to qualifying candidates.   Vocational Rehab Evaluation & Intervention: Vocational Rehab - 03/14/17 1435      Initial Vocational Rehab Evaluation & Intervention   Assessment shows need for Vocational Rehabilitation  No       Education: Education Goals: Education classes will be provided on a variety  of topics geared toward better understanding of heart health and risk factor modification. Participant will state understanding/return demonstration of topics presented as noted by education test scores.  Learning Barriers/Preferences: Learning Barriers/Preferences - 03/14/17 1434      Learning Barriers/Preferences   Learning Barriers  Hearing;Exercise Concerns    Learning Preferences  None       Education Topics:  AED/CPR: - Group verbal and written instruction with the use of models to demonstrate the basic use of the AED with the basic ABC's of resuscitation.   General Nutrition Guidelines/Fats and Fiber: -Group instruction provided by verbal, written material, models and posters to present the general guidelines for heart healthy nutrition. Gives an explanation and review of dietary fats and fiber.   Pulmonary Rehab from 07/02/2015 in [facility] Cardiac and Pulmonary Rehab  Date  03/31/15  Educator  C. Lenny Pastel, RD  Instruction Review Code (retired)  2- meets goals/outcomes      Controlling Sodium/Reading Food Labels: -Group verbal and written material supporting the discussion of sodium use in heart healthy nutrition. Review and explanation with models, verbal and written materials for utilization of the food label.   Pulmonary Rehab from 07/02/2015 in [facility] Cardiac and Pulmonary Rehab  Date  04/14/15  Educator  LB  Instruction Review Code (retired)  2- meets goals/outcomes      Exercise Physiology & General Exercise Guidelines: - Group verbal and written instruction with models to review the exercise physiology of the cardiovascular system and associated critical values. Provides general exercise guidelines with specific guidelines to those with heart or lung disease.    Aerobic Exercise & Resistance Training: - Gives group verbal and written instruction on the various components of exercise. Focuses on aerobic and resistive training programs and the benefits of this training and  how to safely progress through these programs..   Pulmonary Rehab from 07/02/2015 in [facility] Cardiac and Pulmonary Rehab  Date  03/19/15  Educator  SWay  Instruction Review Code (retired)  2- Statistician, Balance, Mind/Body Relaxation: Provides group verbal/written instruction on the benefits of flexibility and balance training, including mind/body exercise modes such as yoga, pilates and tai chi.  Demonstration and skill practice provided.   Pulmonary Rehab from 07/02/2015 in [facility] Cardiac and Pulmonary Rehab  Date  07/02/15  Educator  Salley Hews, PT  Instruction Review Code (retired)  2- meets goals/outcomes      Stress and Anxiety: - Provides group verbal and written instruction about the health risks of elevated stress and causes of high stress.  Discuss the correlation between heart/lung disease and anxiety and treatment options. Review healthy ways to manage with stress and anxiety.   Pulmonary Rehab from 07/02/2015 in [facility] Cardiac and Pulmonary Rehab  Date  04/16/15  Educator  [facility]  Instruction Review Code (retired)  2- meets goals/outcomes  Depression: - Provides group verbal and written instruction on the correlation between heart/lung disease and depressed mood, treatment options, and the stigmas associated with seeking treatment.   Pulmonary Rehab from 07/02/2015 in [facility] Cardiac and Pulmonary Rehab  Date  05/28/15  Educator  Berle Mull  Instruction Review Code (retired)  2- meets Designer, fashion/clothing & Physiology of the Heart: - Group verbal and written instruction and models provide basic cardiac anatomy and physiology, with the coronary electrical and arterial systems. Review of Valvular disease and Heart Failure   Pulmonary Rehab from 07/02/2015 in [facility] Cardiac and Pulmonary Rehab  Date  02/28/15  Educator  CE  Instruction Review Code (retired)  2- meets goals/outcomes      Cardiac Procedures: - Group verbal and written  instruction to review commonly prescribed medications for heart disease. Reviews the medication, class of the drug, and side effects. Includes the steps to properly store meds and maintain the prescription regimen. (beta blockers and nitrates)   Cardiac Medications I: - Group verbal and written instruction to review commonly prescribed medications for heart disease. Reviews the medication, class of the drug, and side effects. Includes the steps to properly store meds and maintain the prescription regimen.   Pulmonary Rehab from 07/02/2015 in [facility] Cardiac and Pulmonary Rehab  Date  06/27/15  Educator  CE  Instruction Review Code (retired)  2- meets goals/outcomes      Cardiac Medications II: -Group verbal and written instruction to review commonly prescribed medications for heart disease. Reviews the medication, class of the drug, and side effects. (all other drug classes)    Go Sex-Intimacy & Heart Disease, Get SMART - Goal Setting: - Group verbal and written instruction through game format to discuss heart disease and the return to sexual intimacy. Provides group verbal and written material to discuss and apply goal setting through the application of the S.M.A.R.T. Method.   Other Matters of the Heart: - Provides group verbal, written materials and models to describe Stable Angina and Peripheral Artery. Includes description of the disease process and treatment options available to the cardiac patient.   Exercise & Equipment Safety: - Individual verbal instruction and demonstration of equipment use and safety with use of the equipment.   Cardiac Rehab from 03/14/2017 in [facility] Cardiac and Pulmonary Rehab  Date  03/14/17  Educator  [facility]  Instruction Review Code  1- Verbalizes Understanding      Infection Prevention: - Provides verbal and written material to individual with discussion of infection control including proper hand washing and proper equipment cleaning during exercise  session.   Cardiac Rehab from 03/14/2017 in [facility] Cardiac and Pulmonary Rehab  Date  03/14/17  Educator  [facility]  Instruction Review Code  1- Verbalizes Understanding      Falls Prevention: - Provides verbal and written material to individual with discussion of falls prevention and safety.   Cardiac Rehab from 03/14/2017 in [facility] Cardiac and Pulmonary Rehab  Date  03/14/17  Educator  [facility]  Instruction Review Code  1- Verbalizes Understanding      Diabetes: - Individual verbal and written instruction to review signs/symptoms of diabetes, desired ranges of glucose level fasting, after meals and with exercise. Acknowledge that pre and post exercise glucose checks will be done for 3 sessions at entry of program.   Cardiac Rehab from 03/14/2017 in [facility] Cardiac and Pulmonary Rehab  Date  03/14/17  Educator  [facility]  Instruction Review Code  1- [employer]  Know Your Numbers and Risk Factors: -Group verbal and written instruction about important numbers in your health.  Discussion of what are risk factors and how they play a role in the disease process.  Review of Cholesterol, Blood Pressure, Diabetes, and BMI and the role they play in your overall health.   Sleep Hygiene: -Provides group verbal and written instruction about how sleep can affect your health.  Define sleep hygiene, discuss sleep cycles and impact of sleep habits. Review good sleep hygiene tips.    Other: -Provides group and verbal instruction on various topics (see comments)   Knowledge Questionnaire Score: Knowledge Questionnaire Score - 03/14/17 1435      Knowledge Questionnaire Score   Pre Score  25/28 correct answers reviewed with Simeon Craft       Core Components/Risk Factors/Patient Goals at Admission: Personal Goals and Risk Factors at Admission - 03/14/17 1423      Core Components/Risk Factors/Patient Goals on Admission    Weight Management  Yes;Obesity;Weight Loss    Intervention  Weight Management:  Develop a combined nutrition and exercise program designed to reach desired caloric intake, while maintaining appropriate intake of nutrient and fiber, sodium and fats, and appropriate energy expenditure required for the weight goal.;Weight Management: Provide education and appropriate resources to help participant work on and attain dietary goals.;Weight Management/Obesity: Establish reasonable short term and long term weight goals.;Obesity: Provide education and appropriate resources to help participant work on and attain dietary goals.    Admit Weight  254 lb 14.4 oz (115.6 kg)    Goal Weight: Short Term  250 lb (113.4 kg)    Goal Weight: Long Term  210 lb (95.3 kg)    Expected Outcomes  Short Term: Continue to assess and modify interventions until short term weight is achieved;Long Term: Adherence to nutrition and physical activity/exercise program aimed toward attainment of established weight goal;Weight Loss: Understanding of general recommendations for a balanced deficit meal plan, which promotes 1-2 lb weight loss per week and includes a negative energy balance of [phone] kcal/d;Understanding of distribution of calorie intake throughout the day with the consumption of 4-5 meals/snacks;Understanding recommendations for meals to include 15-35% energy as protein, 25-35% energy from fat, 35-60% energy from carbohydrates, less than '200mg'$  of dietary cholesterol, 20-35 gm of total fiber daily    Tobacco Cessation  Yes    Number of packs per day  1 cig each morning    Intervention  Assist the participant in steps to quit. Provide individualized education and counseling about committing to Tobacco Cessation, relapse prevention, and pharmacological support that can be provided by physician.;Advice worker, assist with locating and accessing local/national Quit Smoking programs, and support quit date choice.    Expected Outcomes  Short Term: Will demonstrate readiness to quit, by selecting a  quit date.;Long Term: Complete abstinence from all tobacco products for at least 12 months from quit date.;Short Term: Will quit all tobacco product use, adhering to prevention of relapse plan.    Diabetes  Yes    Intervention  Provide education about signs/symptoms and action to take for hypo/hyperglycemia.;Provide education about proper nutrition, including hydration, and aerobic/resistive exercise prescription along with prescribed medications to achieve blood glucose in normal ranges: Fasting glucose 65-99 mg/dL    Expected Outcomes  Short Term: Participant verbalizes understanding of the signs/symptoms and immediate care of hyper/hypoglycemia, proper foot care and importance of medication, aerobic/resistive exercise and nutrition plan for blood glucose control.;Long Term: Attainment of HbA1C < 7%.    Hypertension  Yes    Intervention  Provide education on lifestyle modifcations including regular physical activity/exercise, weight management, moderate sodium restriction and increased consumption of fresh fruit, vegetables, and low fat dairy, alcohol moderation, and smoking cessation.;Monitor prescription use compliance.    Expected Outcomes  Short Term: Continued assessment and intervention until BP is < 140/85m HG in hypertensive participants. < 130/864mHG in hypertensive participants with diabetes, heart failure or chronic kidney disease.;Long Term: Maintenance of blood pressure at goal levels.    Lipids  Yes    Intervention  Provide education and support for participant on nutrition & aerobic/resistive exercise along with prescribed medications to achieve LDL '70mg'$ , HDL >'40mg'$ .    Expected Outcomes  Short Term: Participant states understanding of desired cholesterol values and is compliant with medications prescribed. Participant is following exercise prescription and nutrition guidelines.;Long Term: Cholesterol controlled with medications as prescribed, with individualized exercise RX and with  personalized nutrition plan. Value goals: LDL < '70mg'$ , HDL > 40 mg.       Core Components/Risk Factors/Patient Goals Review:    Core Components/Risk Factors/Patient Goals at Discharge (Final Review):    ITP Comments: ITP Comments    Row Name 03/14/17 1400 03/23/17 0645         ITP Comments  Med review completed. Initial ITP created. Diagnosis can be found in Media Tab from [facility] [place]0/12/18  30 Day review. Continue with ITP unless directed changes per Medical Director review.   New to program         Comments:

## 2017-03-24 DIAGNOSIS — I2089 Other forms of angina pectoris: Secondary | ICD-10-CM

## 2017-03-24 DIAGNOSIS — I25118 Atherosclerotic heart disease of native coronary artery with other forms of angina pectoris: Secondary | ICD-10-CM | POA: Diagnosis not present

## 2017-03-24 DIAGNOSIS — I208 Other forms of angina pectoris: Secondary | ICD-10-CM

## 2017-03-24 LAB — GLUCOSE, CAPILLARY
GLUCOSE-CAPILLARY: 188 mg/dL — AB (ref 65–99)
GLUCOSE-CAPILLARY: 150 mg/dL — AB (ref 65–99)

## 2017-03-24 NOTE — Progress Notes (Signed)
Daily Session Note  Patient Details  Name: Jeffrey Burgess MRN: [id number] Date of Birth: 1945/12/22 Referring Provider:     Cardiac Rehab from 03/14/2017 in [facility] Cardiac [facility] Rehab  Referring Provider  [name] MD  [name]      Encounter Date: 03/24/2017  Check In: Session Check In - 03/24/17 1713      Check-In   Location  [facility]-Cardiac & Pulmonary Rehab    Staff Present  Earlean Shawl, BS, ACSM CEP, Exercise Physiologist;Meredith Sherryll Burger, RN BSN;Naira Standiford Flavia Shipper    Supervising physician immediately available to respond to emergencies  See telemetry face sheet for immediately available ER MD    Medication changes reported      No    Fall or balance concerns reported     No    Warm-up and Cool-down  Performed on first and last piece of equipment    Resistance Training Performed  Yes    VAD Patient?  No      Pain Assessment   Currently in Pain?  No/denies          Social History   Tobacco Use  Smoking Status Current Every Day Smoker  . Packs/day: 0.25  . Years: 57.00  . Pack years: 14.25  . Types: Cigarettes  Smokeless Tobacco Former [place]   reports smoking 1 cig each morning.    Goals Met:  Exercise tolerated well Personal goals reviewed No report of cardiac concerns or symptoms Strength training completed today  Goals Unmet:  Not Applicable  Comments: First full day of exercise!  Patient was oriented to gym and equipment including functions, settings, policies, and procedures.  Patient's individual exercise prescription and treatment plan were reviewed.  All starting workloads were established based on the results of the 6 minute walk test done at initial orientation visit.  The plan for exercise progression was also introduced and progression will be customized based on patient's performance and goals.   Dr. Emily Filbert is Medical Director for [place] and [name].

## 2017-03-28 ENCOUNTER — Encounter: Payer: Non-veteran care | Attending: Internal Medicine | Admitting: *Deleted

## 2017-03-28 DIAGNOSIS — I25118 Atherosclerotic heart disease of native coronary artery with other forms of angina pectoris: Secondary | ICD-10-CM | POA: Diagnosis present

## 2017-03-28 DIAGNOSIS — I2089 Other forms of angina pectoris: Secondary | ICD-10-CM

## 2017-03-28 DIAGNOSIS — I208 Other forms of angina pectoris: Secondary | ICD-10-CM

## 2017-03-28 LAB — GLUCOSE, CAPILLARY
Glucose-Capillary: 216 mg/dL — ABNORMAL HIGH (ref 65–99)
Glucose-Capillary: 141 mg/dL — ABNORMAL HIGH (ref 65–99)

## 2017-03-28 NOTE — Progress Notes (Signed)
Daily Session Note  Patient Details  Name: Jeffrey Burgess MRN: [id number] Date of Birth: 06-02-1945 Referring Provider:     Cardiac Rehab from 03/14/2017 in [facility] Cardiac and Pulmonary Rehab  Referring Provider  [name] MD  [name]      Encounter Date: 03/28/2017  Check In: Session Check In - 03/28/17 1645      Check-In   Location  [facility]-Cardiac & Pulmonary Rehab    Staff Present  Earlean Shawl, BS, ACSM CEP, Exercise Physiologist;Amanda Oletta Darter, BA, ACSM CEP, Exercise Physiologist;Carroll Enterkin, RN, BSN    Supervising physician immediately available to respond to emergencies  See telemetry face sheet for immediately available ER MD    Medication changes reported      No    Fall or balance concerns reported     No    Warm-up and Cool-down  Performed on first and last piece of equipment    Resistance Training Performed  Yes    VAD Patient?  No      Pain Assessment   Currently in Pain?  No/denies    Multiple Pain Sites  No          Social History   Tobacco Use  Smoking Status Current Every Day Smoker  . Packs/day: 0.25  . Years: 57.00  . Pack years: 14.25  . Types: Cigarettes  Smokeless Tobacco Former [name]   reports smoking 1 cig each morning.    Goals Met:  Independence with exercise equipment Exercise tolerated well No report of cardiac concerns or symptoms Strength training completed today  Goals Unmet:  Not Applicable  Comments: Pt able to follow exercise prescription today without complaint.  Will continue to monitor for progression.    Dr. Emily Filbert is Medical Director for [place] and [name].

## 2017-03-30 DIAGNOSIS — I208 Other forms of angina pectoris: Secondary | ICD-10-CM

## 2017-03-30 DIAGNOSIS — I25118 Atherosclerotic heart disease of native coronary artery with other forms of angina pectoris: Secondary | ICD-10-CM | POA: Diagnosis not present

## 2017-03-30 LAB — GLUCOSE, CAPILLARY
GLUCOSE-CAPILLARY: 203 mg/dL — AB (ref 65–99)
GLUCOSE-CAPILLARY: 146 mg/dL — AB (ref 65–99)

## 2017-03-30 NOTE — Progress Notes (Signed)
Daily Session Note  Patient Details  Name: Jeffrey Burgess MRN: [id number] Date of Birth: February 09, 1946 Referring Provider:     Cardiac Rehab from 03/14/2017 in [facility] Cardiac and Pulmonary Rehab  Referring Provider  [name] MD  [name]      Encounter Date: 03/30/2017  Check In: Session Check In - 03/30/17 1632      Check-In   Location  [facility]-Cardiac & Pulmonary Rehab    Staff Present  Renita Papa, RN Vickki Hearing, BA, ACSM CEP, Exercise Physiologist;Carroll Enterkin, RN, BSN    Supervising physician immediately available to respond to emergencies  See telemetry face sheet for immediately available ER MD    Medication changes reported      No    Fall or balance concerns reported     No    Warm-up and Cool-down  Performed on first and last piece of equipment    Resistance Training Performed  Yes    VAD Patient?  No      Pain Assessment   Currently in Pain?  No/denies    Multiple Pain Sites  No        Exercise Prescription Changes - 03/30/17 1400      Response to Exercise   Blood Pressure (Admit)  140/70    Blood Pressure (Exercise)  162/80    Blood Pressure (Exit)  130/60    Heart Rate (Admit)  68 bpm    Heart Rate (Exercise)  122 bpm    Heart Rate (Exit)  92 bpm    Rating of Perceived Exertion (Exercise)  13    Symptoms  none    Intensity  THRR unchanged      Progression   Progression  Continue to progress workloads to maintain intensity without signs/symptoms of physical distress.      Resistance Training   Training Prescription  Yes    Weight  3 lb    Reps  10-15      NuStep   Level  1    SPM  80    Minutes  15      Arm Ergometer   Level  1    Minutes  15       Social History   Tobacco Use  Smoking Status Current Every Day Smoker  . Packs/day: 0.25  . Years: 57.00  . Pack years: 14.25  . Types: Cigarettes  Smokeless Tobacco Former [place]   reports smoking 1 cig each morning.    Goals Met:  Independence with  exercise equipment Exercise tolerated well No report of cardiac concerns or symptoms  Goals Unmet:  Not Applicable  Comments: Pt able to follow exercise prescription today without complaint.  Will continue to monitor for progression.    Dr. Emily Filbert is Medical Director for [place] and [name].

## 2017-03-31 ENCOUNTER — Telehealth: Payer: Self-pay | Admitting: *Deleted

## 2017-03-31 ENCOUNTER — Encounter: Payer: Self-pay | Admitting: *Deleted

## 2017-03-31 NOTE — Telephone Encounter (Signed)
Jeffrey Burgess called to say he is sorry that he can not attend Cardiac Rehab today but he didn't sleep well due to coughing and sneezing. I suggested that he call his MD also.

## 2017-04-04 ENCOUNTER — Encounter: Payer: Non-veteran care | Admitting: *Deleted

## 2017-04-04 DIAGNOSIS — I10 Essential (primary) hypertension: Secondary | ICD-10-CM

## 2017-04-04 DIAGNOSIS — I208 Other forms of angina pectoris: Secondary | ICD-10-CM

## 2017-04-04 DIAGNOSIS — I25118 Atherosclerotic heart disease of native coronary artery with other forms of angina pectoris: Secondary | ICD-10-CM | POA: Diagnosis not present

## 2017-04-04 NOTE — Progress Notes (Signed)
Daily Session Note  Patient Details  Name: Jeffrey Burgess MRN: [id number] Date of Birth: 02/21/1946 Referring Provider:     Cardiac Rehab from 03/14/2017 in [facility] Cardiac and Pulmonary Rehab  Referring Provider  Lougani, Rahol MD  [[place] [facility]]      Encounter Date: 04/04/2017  Check In: Session Check In - 04/04/17 1751      Check-In   Location  [facility]-Cardiac & Pulmonary Rehab    Staff Present  Mary Jo Abernethy, RN, BSN, MA;Kelly Hayes, BS, ACSM CEP, Exercise Physiologist;Amanda Sommer, BA, ACSM CEP, Exercise Physiologist;Joseph Hood RCP,RRT,BSRT    Supervising physician immediately available to respond to emergencies  See telemetry face sheet for immediately available ER MD    Medication changes reported      No    Fall or balance concerns reported     No    Warm-up and Cool-down  Performed on first and last piece of equipment    Resistance Training Performed  Yes    VAD Patient?  No      Pain Assessment   Currently in Pain?  No/denies    Multiple Pain Sites  No          Social History   Tobacco Use  Smoking Status Current Every Day Smoker  . Packs/day: 0.25  . Years: 57.00  . Pack years: 14.25  . Types: Cigarettes  Smokeless Tobacco Former User  Tobacco Comment   reports smoking 1 cig each morning.    Goals Met:  Independence with exercise equipment Exercise tolerated well No report of cardiac concerns or symptoms Strength training completed today  Goals Unmet:  Not Applicable  Comments: Pt able to follow exercise prescription today without complaint.  Will continue to monitor for progression.    Dr. Mark Miller is Medical Director for [facility] and [name]. 

## 2017-04-07 DIAGNOSIS — I208 Other forms of angina pectoris: Secondary | ICD-10-CM

## 2017-04-07 DIAGNOSIS — I25118 Atherosclerotic heart disease of native coronary artery with other forms of angina pectoris: Secondary | ICD-10-CM | POA: Diagnosis not present

## 2017-04-07 NOTE — Progress Notes (Signed)
Daily Session Note  Patient Details  Name: ANTOLIN BELSITO MRN: [id number] Date of Birth: [birth year] Referring Provider:     Cardiac Rehab from 03/14/2017 in [facility] Cardiac and Pulmonary Rehab  Referring Provider  [name] MD  [name]      Encounter Date: 04/07/2017  Check In: Session Check In - 04/07/17 1637      Check-In   Location  [facility] Re[facility]    Staff Present  Earlean Shawl, BS, ACSM CEP, Exercise Physiologist;Meredith Sherryll Burger, RN BSN;Akeylah Hendel Flavia Shipper    Supervising physician immediately available to respond to emergencies  See telemetry face sheet for immediately available ER MD    Medication changes reported      No    Fall or balance concerns reported     No    Warm-up and Cool-down  Performed on first and last piece of equipment    Resistance Training Performed  Yes    VAD Patient?  No      Pain Assessment   Currently in Pain?  No/denies          Social History   Tobacco Use  Smoking Status Current Every Day Smoker  . Packs/day: 0.25  . Years: 57.00  . Pack years: 14.25  . Types: Cigarettes  Smokeless Tobacco Former [place]   reports smoking 1 cig each morning.    Goals Met:  Independence with exercise equipment Exercise tolerated well No report of cardiac concerns or symptoms Strength training completed today  Goals Unmet:  Not Applicable  Comments: Pt able to follow exercise prescription today without complaint.  Will continue to monitor for progression.   Dr. Emily Filbert is Medical Director for [place] and [name].

## 2017-04-11 DIAGNOSIS — I208 Other forms of angina pectoris: Secondary | ICD-10-CM

## 2017-04-11 DIAGNOSIS — I25118 Atherosclerotic heart disease of native coronary artery with other forms of angina pectoris: Secondary | ICD-10-CM | POA: Diagnosis not present

## 2017-04-11 NOTE — Progress Notes (Signed)
Daily Session Note  Patient Details  Name: JOBE MUTCH MRN: [id number] Date of Birth: 13-Jul-1945 Referring Provider:     Cardiac Rehab from 03/14/2017 in [facility] Cardiac and Pulmonary Rehab  Referring Provider  [name] MD  [name]      Encounter Date: 04/11/2017  Check In: Session Check In - 04/11/17 1620      Check-In   Location  [facility] Re[facility]    Staff Present  Earlean Shawl, BS, ACSM CEP, Exercise Physiologist;Amanda Oletta Darter, BA, ACSM CEP, Exercise Physiologist;Carroll Enterkin, RN, BSN    Supervising physician immediately available to respond to emergencies  See telemetry face sheet for immediately available ER MD    Medication changes reported      No    Fall or balance concerns reported     No    Warm-up and Cool-down  Performed on first and last piece of equipment    Resistance Training Performed  Yes    VAD Patient?  No      Pain Assessment   Currently in Pain?  No/denies    Multiple Pain Sites  No          Social History   Tobacco Use  Smoking Status Current Every Day Smoker  . Packs/day: 0.25  . Years: 57.00  . Pack years: 14.25  . Types: Cigarettes  Smokeless Tobacco Former [place]   reports smoking 1 cig each morning.    Goals Met:  Independence with exercise equipment Exercise tolerated well No report of cardiac concerns or symptoms Strength training completed today  Goals Unmet:  Not Applicable  Comments: Pt able to follow exercise prescription today without complaint.  Will continue to monitor for progression.    Dr. Emily Filbert is Medical Director for [place] and [name].

## 2017-04-13 ENCOUNTER — Encounter: Payer: Non-veteran care | Admitting: *Deleted

## 2017-04-13 DIAGNOSIS — I25118 Atherosclerotic heart disease of native coronary artery with other forms of angina pectoris: Secondary | ICD-10-CM | POA: Diagnosis not present

## 2017-04-13 DIAGNOSIS — I208 Other forms of angina pectoris: Secondary | ICD-10-CM

## 2017-04-13 DIAGNOSIS — I2089 Other forms of angina pectoris: Secondary | ICD-10-CM

## 2017-04-13 NOTE — Progress Notes (Signed)
Daily Session Note  Patient Details  Name: Jeffrey Burgess MRN: [id number] Date of Birth: 1945-05-31 Referring Provider:     Cardiac Rehab from 03/14/2017 in [facility] Cardiac and Pulmonary Rehab  Referring Provider  [name] MD  [name]      Encounter Date: 04/13/2017  Check In: Session Check In - 04/13/17 1627      Check-In   Location  [facility] Re[facility]    Staff Present  Renita Papa, RN Vickki Hearing, BA, ACSM CEP, Exercise Physiologist;Carroll Enterkin, RN, BSN    Supervising physician immediately available to respond to emergencies  See telemetry face sheet for immediately available ER MD    Medication changes reported      No    Fall or balance concerns reported     No    Warm-up and Cool-down  Performed on first and last piece of equipment    Resistance Training Performed  Yes    VAD Patient?  No      Pain Assessment   Currently in Pain?  No/denies        Exercise Prescription Changes - 04/13/17 1500      Response to Exercise   Blood Pressure (Admit)  124/63    Blood Pressure (Exercise)  174/74    Blood Pressure (Exit)  134/74    Heart Rate (Admit)  78 bpm    Heart Rate (Exercise)  116 bpm    Heart Rate (Exit)  91 bpm    Rating of Perceived Exertion (Exercise)  12    Symptoms  none    Intensity  THRR unchanged      Progression   Progression  Continue to progress workloads to maintain intensity without signs/symptoms of physical distress.    Average METs  2.8      Resistance Training   Training Prescription  Yes    Weight  3 lb    Reps  10-15      Treadmill   MPH  1.4    Grade  0.5      Arm Ergometer   Level  1    Minutes  15    METs  3.4       Social History   Tobacco Use  Smoking Status Current Every Day Smoker  . Packs/day: 0.25  . Years: 57.00  . Pack years: 14.25  . Types: Cigarettes  Smokeless Tobacco Former [place]   reports smoking 1 cig each morning.    Goals Met:  Independence with  exercise equipment Exercise tolerated well No report of cardiac concerns or symptoms Strength training completed today  Goals Unmet:  Not Applicable  Comments: Pt able to follow exercise prescription today without complaint.  Will continue to monitor for progression.    Dr. Emily Filbert is Medical Director for [place] and [name].

## 2017-04-14 DIAGNOSIS — I25118 Atherosclerotic heart disease of native coronary artery with other forms of angina pectoris: Secondary | ICD-10-CM | POA: Diagnosis not present

## 2017-04-14 DIAGNOSIS — I208 Other forms of angina pectoris: Secondary | ICD-10-CM

## 2017-04-14 NOTE — Progress Notes (Signed)
Daily Session Note  Patient Details  Name: Jeffrey Burgess MRN: [id number] Date of Birth: 07/06/1945 Referring Provider:     Cardiac Rehab from 03/14/2017 in [facility] Cardiac and Pulmonary Rehab  Referring Provider  Lougani, Rahol MD  [[place] [facility]      Encounter Date: 04/14/2017  Check In: Session Check In - 04/14/17 1740      Check-In   Location  [facility]-Cardiac & Pulmonary Rehab    Staff Present  Kelly Hayes, BS, ACSM CEP, Exercise Physiologist;Amanda Sommer, BA, ACSM CEP, Exercise Physiologist;Joseph Hood RCP,RRT,BSRT;Meredith Craven, RN BSN    Supervising physician immediately available to respond to emergencies  See telemetry face sheet for immediately available ER MD    Medication changes reported      No    Fall or balance concerns reported     No    Tobacco Cessation  No Change    Warm-up and Cool-down  Performed on first and last piece of equipment    Resistance Training Performed  Yes    VAD Patient?  No      Pain Assessment   Currently in Pain?  No/denies          Social History   Tobacco Use  Smoking Status Current Every Day Smoker  . Packs/day: 0.25  . Years: 57.00  . Pack years: 14.25  . Types: Cigarettes  Smokeless Tobacco Former User  Tobacco Comment   reports smoking 1 cig each morning.    Goals Met:  Independence with exercise equipment Exercise tolerated well No report of cardiac concerns or symptoms Strength training completed today  Goals Unmet:  Not Applicable  Comments: Pt able to follow exercise prescription today without complaint.  Will continue to monitor for progression.   Dr. Mark Miller is Medical Director for [facility] and [name]. 

## 2017-04-18 ENCOUNTER — Encounter: Payer: Non-veteran care | Admitting: *Deleted

## 2017-04-18 DIAGNOSIS — I25118 Atherosclerotic heart disease of native coronary artery with other forms of angina pectoris: Secondary | ICD-10-CM

## 2017-04-18 DIAGNOSIS — I10 Essential (primary) hypertension: Secondary | ICD-10-CM

## 2017-04-18 DIAGNOSIS — I208 Other forms of angina pectoris: Secondary | ICD-10-CM

## 2017-04-18 NOTE — Progress Notes (Signed)
Daily Session Note  Patient Details  Name: Jeffrey Burgess MRN: [id number] Date of Birth: December 30, 1945 Referring Provider:     Cardiac Rehab from 03/14/2017 in [facility] Cardiac and Pulmonary Rehab  Referring Provider  [name] MD  [name]      Encounter Date: 04/18/2017  Check In: Session Check In - 04/18/17 1650      Check-In   Location  [facility]Cardiac [facility] Pulmonary Rehab    Staff Present  Nada Maclachlan, BA, ACSM CEP, Exercise Physiologist;Meredith Sherryll Burger, RN Moises Blood, BS, ACSM CEP, Exercise Physiologist;Krista Frederico Hamman, RN BSN;Susanne Bice, RN, BSN, [employer] physician immediately available to respond to emergencies  See telemetry face sheet for immediately available ER MD    Medication changes reported      No    Fall or balance concerns reported     No    Warm-up and Cool-down  Performed on first and last piece of equipment    Resistance Training Performed  Yes    VAD Patient?  No      Pain Assessment   Currently in Pain?  No/denies    Multiple Pain Sites  No          Social History   Tobacco Use  Smoking Status Current Every Day Smoker  . Packs/day: 0.25  . Years: 57.00  . Pack years: 14.25  . Types: Cigarettes  Smokeless Tobacco Former [place]   reports smoking 1 cig each morning.    Goals Met:  Independence with exercise equipment Exercise tolerated well Personal goals reviewed No report of cardiac concerns or symptoms Strength training completed today  Goals Unmet:  Not Applicable  Comments: Pt able to follow exercise prescription today without complaint.  Will continue to monitor for progression.    Dr. Emily Filbert is Medical Director for [place] and [name].

## 2017-04-20 ENCOUNTER — Encounter: Payer: Non-veteran care | Admitting: *Deleted

## 2017-04-20 ENCOUNTER — Encounter: Payer: Self-pay | Admitting: *Deleted

## 2017-04-20 DIAGNOSIS — I208 Other forms of angina pectoris: Secondary | ICD-10-CM

## 2017-04-20 DIAGNOSIS — I25118 Atherosclerotic heart disease of native coronary artery with other forms of angina pectoris: Secondary | ICD-10-CM | POA: Diagnosis not present

## 2017-04-20 NOTE — Progress Notes (Signed)
Daily Session Note  Patient Details  Name: Jeffrey Burgess MRN: [id number] Date of Birth: 01/12/1946 Referring Provider:     Cardiac Rehab from 03/14/2017 in [facility] Cardiac and Pulmonary Rehab  Referring Provider  [name] MD  [name]      Encounter Date: 04/20/2017  Check In: Session Check In - 04/20/17 1728      Check-In   Location  [facility] Re[facility]    Staff Present  Renita Papa, RN Vickki Hearing, BA, ACSM CEP, Exercise Physiologist;Carroll Enterkin, RN, BSN    Supervising physician immediately available to respond to emergencies  See telemetry face sheet for immediately available ER MD    Medication changes reported      No    Fall or balance concerns reported     No    Warm-up and Cool-down  Performed on first and last piece of equipment    Resistance Training Performed  Yes    VAD Patient?  No      Pain Assessment   Currently in Pain?  No/denies          Social History   Tobacco Use  Smoking Status Current Every Day Smoker  . Packs/day: 0.25  . Years: 57.00  . Pack years: 14.25  . Types: Cigarettes  Smokeless Tobacco Former [place]   reports smoking 1 cig each morning.    Goals Met:  Independence with exercise equipment Exercise tolerated well No report of cardiac concerns or symptoms Strength training completed today  Goals Unmet:  Not Applicable  Comments: Pt able to follow exercise prescription today without complaint.  Will continue to monitor for progression.    Dr. Emily Filbert is Medical Director for [place] and [name].

## 2017-04-20 NOTE — Progress Notes (Signed)
Cardiac Individual Treatment Plan  Patient Details  Name: Jeffrey Burgess MRN: [id number] Date of Birth: 1945-05-10 Referring Provider:     Cardiac Rehab from 03/14/2017 in [facility] Cardiac and Pulmonary Rehab  Referring Provider  [name] MD  [name]      Initial Encounter Date:    Cardiac Rehab from 03/14/2017 in [facility] Cardiac and Pulmonary Rehab  Date  03/14/17  Referring Provider  [name] MD  [name]      Visit Diagnosis: Stable angina ([place])  Patient's Home Medications on Admission:  Current Outpatient Medications:  .  acetaminophen (TYLENOL) 500 MG tablet, Take 500 mg by mouth., Disp: , Rfl:  .  albuterol (PROAIR HFA) 108 (90 Base) MCG/ACT inhaler, Inhale 2 puffs into the lungs., Disp: , Rfl:  .  aspirin EC 81 MG tablet, Take 81 mg by mouth., Disp: , Rfl:  .  beclomethasone (BECONASE-AQ) 42 MCG/SPRAY nasal spray, Place 2 sprays into the nose., Disp: , Rfl:  .  benzonatate (TESSALON) 200 MG capsule, Take 200 mg by mouth 3 (three) times daily as needed for cough., Disp: , Rfl:  .  budesonide-formoterol (SYMBICORT) 160-4.5 MCG/ACT inhaler, Inhale 2 puffs into the lungs., Disp: , Rfl:  .  buPROPion (WELLBUTRIN) 100 MG tablet, Take 100 mg by mouth., Disp: , Rfl:  .  busPIRone (BUSPAR) 10 MG tablet, Take 10 mg by mouth 3 (three) times daily. , Disp: , Rfl:  .  cetirizine (ZYRTEC) 10 MG tablet, Take 10 mg by mouth daily., Disp: , Rfl:  .  Cholecalciferol (VITAMIN D3 SUPER STRENGTH) 2000 units TABS, Take 2,000 Units by mouth., Disp: , Rfl:  .  docusate sodium (STOOL SOFTENER) 100 MG capsule, Take 100 mg by mouth., Disp: , Rfl:  .  fluticasone (FLONASE) 50 MCG/ACT nasal spray, Place 1 spray into the nose., Disp: , Rfl:  .  glucose blood test strip, 1 each by Other route as needed for other. Use as instructed, Disp: , Rfl:  .  insulin aspart protamine - aspart (NOVOLOG 70/30 MIX) (70-30) 100 UNIT/ML FlexPen, Inject 18 Units into the skin., Disp: , Rfl:  .  lisinopril  (PRINIVIL,ZESTRIL) 10 MG tablet, Take 10 mg by mouth daily., Disp: , Rfl:  .  metFORMIN (GLUCOPHAGE) 500 MG tablet, Take 500 mg by mouth 2 (two) times daily after a meal. , Disp: , Rfl:  .  metoprolol succinate (TOPROL-XL) 100 MG 24 hr tablet, Take 100 mg by mouth daily. Take with or immediately following a meal., Disp: , Rfl:  .  metoprolol tartrate (LOPRESSOR) 25 MG tablet, Take 25 mg by mouth., Disp: , Rfl:  .  montelukast (SINGULAIR) 10 MG tablet, Take 10 mg by mouth., Disp: , Rfl:  .  nicotine polacrilex (COMMIT) 2 MG lozenge, Place inside cheek., Disp: , Rfl:  .  nitroGLYCERIN (NITROSTAT) 0.4 MG SL tablet, Place 0.4 mg under the tongue., Disp: , Rfl:  .  Omega-3 1000 MG CAPS, Take 2 g by mouth., Disp: , Rfl:  .  pramipexole (MIRAPEX) 0.5 MG tablet, Take 0.5 mg by mouth., Disp: , Rfl:  .  pregabalin (LYRICA) 100 MG capsule, Take 200 mg by mouth every morning. , Disp: , Rfl:  .  ranitidine (ZANTAC) 150 MG capsule, Take 150 mg by mouth 2 (two) times daily. , Disp: , Rfl:  .  rosuvastatin (CRESTOR) 20 MG tablet, Take 20 mg by mouth., Disp: , Rfl:  .  tiotropium (SPIRIVA) 18 MCG inhalation capsule, Place 18 mcg into  inhaler and inhale., Disp: , Rfl:  .  VITAMIN B COMPLEX-C CAPS, Take 1 tablet by mouth., Disp: , Rfl:  .  vitamin E 400 UNIT capsule, Take 400 Units by mouth., Disp: , Rfl:   Past Medical History: Past Medical History:  Diagnosis Date  . Asthma   . Diabetes ([place])   . Hyperlipidemia   . Hypertension   . Myocardial infarction ([place])   . Rheumatoid arthritis ([place])   . Sleep apnea     Tobacco Use: Social History   Tobacco Use  Smoking Status Current Every Day Smoker  . Packs/day: 0.25  . Years: 57.00  . Pack years: 14.25  . Types: Cigarettes  Smokeless Tobacco Former [place]   reports smoking 1 cig each morning.    Labs: Recent Review Flowsheet Data    There is no flowsheet data to display.       Exercise Target Goals:    Exercise Program  Goal: Individual exercise prescription set using results from initial 6 min walk test and THRR while considering  patient's activity barriers and safety.   Exercise Prescription Goal: Initial exercise prescription builds to 30-45 minutes a day of aerobic activity, 2-3 days per week.  Home exercise guidelines will be given to patient during program as part of exercise prescription that the participant will acknowledge.  Activity Barriers & Risk Stratification: Activity Barriers & Cardiac Risk Stratification - 03/14/17 1436      Activity Barriers & Cardiac Risk Stratification   Activity Barriers  Arthritis;Back Problems;Shortness of Breath;Muscular Weakness    Cardiac Risk Stratification  Moderate       6 Minute Walk: 6 Minute Walk    Row Name 03/14/17 1427         6 Minute Walk   Phase  Initial     Distance  902 feet     Walk Time  6 minutes     # of Rest Breaks  0     MPH  1.71     METS  2.05     RPE  13     Perceived Dyspnea   2     VO2 Peak  7.17     Symptoms  Yes (comment)     Comments  SOB     Resting HR  67 bpm     Resting BP  126/64     Resting Oxygen Saturation   98 %     Exercise Oxygen Saturation  during 6 min walk  95 %     Max Ex. HR  104 bpm     Max Ex. BP  176/84     2 Minute Post BP  132/76        Oxygen Initial Assessment:   Oxygen Re-Evaluation:   Oxygen Discharge (Final Oxygen Re-Evaluation):   Initial Exercise Prescription: Initial Exercise Prescription - 03/14/17 1400      Date of Initial Exercise RX and Referring Provider   Date  03/14/17    Referring Provider  [name] MD  [facility]      Treadmill   MPH  1.4    Grade  0.5    Minutes  15    METs  2.17      Arm Ergometer   Level  1    Watts  38    RPM  25    Minutes  15    METs  2      T5 Nustep   Level  1    SPM  80    Minutes  15    METs  2      Prescription Details   Frequency (times per week)  3    Duration  Progress to 45 minutes of aerobic exercise without  signs/symptoms of physical distress      Intensity   THRR 40-80% of Max Heartrate  101-134    Ratings of Perceived Exertion  11-13    Perceived Dyspnea  0-4      Progression   Progression  Continue to progress workloads to maintain intensity without signs/symptoms of physical distress.      Resistance Training   Training Prescription  Yes    Weight  3 lbs    Reps  10-15       Perform Capillary Blood Glucose checks as needed.  Exercise Prescription Changes: Exercise Prescription Changes    Row Name 03/14/17 1400 03/30/17 1400 04/13/17 1500         Response to Exercise   Blood Pressure (Admit)  126/64  140/70  124/63     Blood Pressure (Exercise)  176/84  162/80  174/74     Blood Pressure (Exit)  132/76  130/60  134/74     Heart Rate (Admit)  67 bpm  68 bpm  78 bpm     Heart Rate (Exercise)  104 bpm  122 bpm  116 bpm     Heart Rate (Exit)  75 bpm  92 bpm  91 bpm     Oxygen Saturation (Admit)  98 %  -  -     Oxygen Saturation (Exercise)  95 %  -  -     Rating of Perceived Exertion (Exercise)  _0 Perceived Dyspnea (Exercise)  2  -  -     Symptoms  SOB  none  none     Comments  walk test results  -  -     Intensity  -  THRR unchanged  THRR unchanged       Progression   Progression  -  Continue to progress workloads to maintain intensity without signs/symptoms of physical distress.  Continue to progress workloads to maintain intensity without signs/symptoms of physical distress.     Average METs  -  -  2.8       Resistance Training   Training Prescription  -  Yes  Yes     Weight  -  3 lb  3 lb     Reps  -  10-15  10-15       Treadmill   MPH  -  -  1.4     Grade  -  -  0.5       NuStep   Level  -  1  -     SPM  -  80  -     Minutes  -  15  -       Arm Ergometer   Level  -  1  1     Minutes  -  15  15     METs  -  -  3.4        Exercise Comments: Exercise Comments    Row Name 03/24/17 1714           Exercise Comments  First full day of  exercise!  Patient was oriented to gym and equipment including functions, settings, policies, and procedures.  Patient's  individual exercise prescription and treatment plan were reviewed.  All starting workloads were established based on the results of the 6 minute walk test done at initial orientation visit.  The plan for exercise progression was also introduced and progression will be customized based on patient's performance and goals.          Exercise Goals and Review: Exercise Goals    Row Name 03/14/17 1443             Exercise Goals   Increase Physical Activity  Yes       Intervention  Provide advice, education, support and counseling about physical activity/exercise needs.;Develop an individualized exercise prescription for aerobic and resistive training based on initial evaluation findings, risk stratification, comorbidities and participant's personal goals.       Expected Outcomes  Short Term: Attend rehab on a regular basis to increase amount of physical activity.;Long Term: Add in home exercise to make exercise part of routine and to increase amount of physical activity.;Long Term: Exercising regularly at least 3-5 days a week.       Increase Strength and Stamina  Yes       Intervention  Provide advice, education, support and counseling about physical activity/exercise needs.;Develop an individualized exercise prescription for aerobic and resistive training based on initial evaluation findings, risk stratification, comorbidities and participant's personal goals.       Expected Outcomes  Short Term: Increase workloads from initial exercise prescription for resistance, speed, and METs.;Short Term: Perform resistance training exercises routinely during rehab and add in resistance training at home;Long Term: Improve cardiorespiratory fitness, muscular endurance and strength as measured by increased METs and functional capacity (6MWT)       Able to understand and use rate of perceived  exertion (RPE) scale  Yes       Intervention  Provide education and explanation on how to use RPE scale       Expected Outcomes  Short Term: Able to use RPE daily in rehab to express subjective intensity level;Long Term:  Able to use RPE to guide intensity level when exercising independently       Able to understand and use Dyspnea scale  Yes       Intervention  Provide education and explanation on how to use Dyspnea scale       Expected Outcomes  Short Term: Able to use Dyspnea scale daily in rehab to express subjective sense of shortness of breath during exertion;Long Term: Able to use Dyspnea scale to guide intensity level when exercising independently       Knowledge and understanding of Target Heart Rate Range (THRR)  Yes       Intervention  Provide education and explanation of THRR including how the numbers were predicted and where they are located for reference       Expected Outcomes  Short Term: Able to state/look up THRR;Long Term: Able to use THRR to govern intensity when exercising independently;Short Term: Able to use daily as guideline for intensity in rehab       Able to check pulse independently  Yes       Intervention  Provide education and demonstration on how to check pulse in carotid and radial arteries.;Review the importance of being able to check your own pulse for safety during independent exercise       Expected Outcomes  Short Term: Able to explain why pulse checking is important during independent exercise;Long Term: Able to check pulse independently and accurately  Understanding of Exercise Prescription  Yes       Intervention  Provide education, explanation, and written materials on patient's individual exercise prescription       Expected Outcomes  Short Term: Able to explain program exercise prescription;Long Term: Able to explain home exercise prescription to exercise independently          Exercise Goals Re-Evaluation : Exercise Goals Re-Evaluation    Row Name  03/24/17 1714 03/30/17 1432 04/13/17 1527 04/18/17 1644       Exercise Goal Re-Evaluation   Exercise Goals Review  Understanding of Exercise Prescription;Knowledge and understanding of Target Heart Rate Range (THRR);Able to understand and use rate of perceived exertion (RPE) scale  Increase Physical Activity;Increase Strength and Stamina;Able to understand and use Dyspnea scale;Able to understand and use rate of perceived exertion (RPE) scale  Increase Physical Activity;Increase Strength and Stamina;Able to understand and use rate of perceived exertion (RPE) scale  Increase Physical Activity;Increase Strength and Stamina    Comments  Reviewed RPE scale, THR and program prescription with pt today.  Pt voiced understanding and was given a copy of goals to take home.   Pt is tolerating exercise well.    Jeffrey Burgess continues to tolerate exercise well. Staff will continue to monitor progress  Jeffrey Burgess states that he can walk further before resting now that he has been more consistant with his exercise.     Expected Outcomes  Short: Use RPE daily to regulate intensity.  Long: Follow program prescription in THR.   Short - Pt will continue to attend regularly Long - Pt will improve overall MET level  Short - Pt will attend classes regularly Long - Pt will maintain independent exercise  -       Discharge Exercise Prescription (Final Exercise Prescription Changes): Exercise Prescription Changes - 04/13/17 1500      Response to Exercise   Blood Pressure (Admit)  124/63    Blood Pressure (Exercise)  174/74    Blood Pressure (Exit)  134/74    Heart Rate (Admit)  78 bpm    Heart Rate (Exercise)  116 bpm    Heart Rate (Exit)  91 bpm    Rating of Perceived Exertion (Exercise)  12    Symptoms  none    Intensity  THRR unchanged      Progression   Progression  Continue to progress workloads to maintain intensity without signs/symptoms of physical distress.    Average METs  2.8      Resistance Training   Training  Prescription  Yes    Weight  3 lb    Reps  10-15      Treadmill   MPH  1.4    Grade  0.5      Arm Ergometer   Level  1    Minutes  15    METs  3.4       Nutrition:  Target Goals: Understanding of nutrition guidelines, daily intake of sodium <1559m, cholesterol <2022m calories 30% from fat and 7% or less from saturated fats, daily to have 5 or more servings of fruits and vegetables.  Biometrics: Pre Biometrics - 03/14/17 1444      Pre Biometrics   Height  5' 8.9" (1.75 m)    Weight  254 lb 14.4 oz (115.6 kg)    Waist Circumference  48 inches    Hip Circumference  48 inches    Waist to Hip Ratio  1 %    BMI (Calculated)  37.75  Single Leg Stand  3.34 seconds        Nutrition Therapy Plan and Nutrition Goals: Nutrition Therapy & Goals - 03/14/17 1432      Intervention Plan   Intervention  Prescribe, educate and counsel regarding individualized specific dietary modifications aiming towards targeted core components such as weight, hypertension, lipid management, diabetes, heart failure and other comorbidities.;Nutrition handout(s) given to patient.    Expected Outcomes  Short Term Goal: Understand basic principles of dietary content, such as calories, fat, sodium, cholesterol and nutrients.;Short Term Goal: A plan has been developed with personal nutrition goals set during dietitian appointment.;Long Term Goal: Adherence to prescribed nutrition plan.       Nutrition Assessments: Nutrition Assessments - 03/14/17 1432      MEDFICTS Scores   Pre Score  40       Nutrition Goals Re-Evaluation: Nutrition Goals Re-Evaluation    Row Name 04/18/17 1642             Goals   Nutrition Goal  Jeffrey Burgess does not want to meet with the dietician in the cardiac rehab program. He states that he meets with a dietician at the [place] 2x a year and is comfortable following the nutician advise he gets from that dietician.           Nutrition Goals Discharge (Final Nutrition Goals  Re-Evaluation): Nutrition Goals Re-Evaluation - 04/18/17 1642      Goals   Nutrition Goal  Jeffrey Burgess does not want to meet with the dietician in the cardiac rehab program. He states that he meets with a dietician at the [place] 2x a year and is comfortable following the nutician advise he gets from that dietician.        Psychosocial: Target Goals: Acknowledge presence or absence of significant depression and/or stress, maximize coping skills, provide positive support system. Participant is able to verbalize types and ability to use techniques and skills needed for reducing stress and depression.   Initial Review & Psychosocial Screening: Initial Psych Review & Screening - 03/14/17 1433      Initial Review   Current issues with  Current Sleep Concerns He and his wife have been having issues sleeping for the last 4 months      [place]?  Yes      Screening Interventions   Interventions  Encouraged to exercise;Program counselor consult    Expected Outcomes  Short Term goal: Utilizing psychosocial counselor, staff and physician to assist with identification of specific Stressors or current issues interfering with healing process. Setting desired goal for each stressor or current issue identified.;Long Term Goal: Stressors or current issues are controlled or eliminated.;Short Term goal: Identification and review with participant of any Quality of Life or Depression concerns found by scoring the questionnaire.;Long Term goal: The participant improves quality of Life and PHQ9 Scores as seen by post scores and/or verbalization of changes       Quality of Life Scores:  Quality of Life - 03/14/17 1434      Quality of Life Scores   Health/Function Pre  20.53 %    Socioeconomic Pre  21 %    Psych/Spiritual Pre  21 %    Family Pre  21 %    GLOBAL Pre  20.53 %      Scores of 19 and below usually indicate a poorer quality of life in these areas.  A difference of  2-3 points  is a clinically meaningful difference.  A difference  of 2-3 points in the total score of the Quality of Life Index has been associated with significant improvement in overall quality of life, self-image, physical symptoms, and general health in studies assessing change in quality of life.  PHQ-9: Recent Review Flowsheet Data    Depression screen [facility] 2/9 03/14/2017 07/04/2015 06/27/2015 02/18/2015   Decreased Interest 0 0 0 0   Down, Depressed, Hopeless 0 0 0 0   PHQ - 2 Score 0 0 0 0   Altered sleeping 1 0 0 -   Tired, decreased energy _0 -   Change in appetite 0 0 0 -   Feeling bad or failure about yourself  0 1 1 -   Trouble concentrating 0 0 0 -   Moving slowly or fidgety/restless 0 0 0 -   Suicidal thoughts 0 0 0 -   PHQ-9 Score _1 -   Difficult doing work/chores Not difficult at all Not difficult at all Not difficult at all -     Interpretation of Total Score  Total Score Depression Severity:  1-4 = Minimal depression, 5-9 = Mild depression, 10-14 = Moderate depression, 15-19 = Moderately severe depression, 20-27 = Severe depression   Psychosocial Evaluation and Intervention: Psychosocial Evaluation - 03/28/17 1646      Psychosocial Evaluation & Interventions   Comments  Counselor met with Jeffrey Burgess) today for psychosocial evaluation for Cardiac Rehab.  He was in our Pulmonary Rehab program several years ago.  He continues to have a strong suport system with a spouse of 51 years and (2) daughters who live locally.  Jeffrey Burgess has diabetes and sleep apnea, as well as COPD and heart disease.  He reports sleeping well with the use of a CPAP.  He has a good appetite and has goals to lose some weight while in this program.  Yifan reports a history of some anxiety in the past and has been on medication for this for approximately 10 years.  He states this is working well for him and treats his current symptoms.  He states his stress is minimal other than his chronic health conditions.   He has goals to increase his stamina and strength as well as lose weight.  He has gym equipment to use at home once he completes this program.  Jeffrey Burgess will be followed by staff while here.      Expected Outcomes  Santez will benefit from consistent exercise to achieve his states goals.  The educational and psychoeducational components of this program will be helpful in understanding his medical condition and coping more positively.  Cire will meet with the dietician to address his weight loss goals.      Continue Psychosocial Services   Follow up required by staff       Psychosocial Re-Evaluation:   Psychosocial Discharge (Final Psychosocial Re-Evaluation):   Vocational Rehabilitation: Provide vocational rehab assistance to qualifying candidates.   Vocational Rehab Evaluation & Intervention: Vocational Rehab - 03/14/17 1435      Initial Vocational Rehab Evaluation & Intervention   Assessment shows need for Vocational Rehabilitation  No       Education: Education Goals: Education classes will be provided on a variety of topics geared toward better understanding of heart health and risk factor modification. Participant will state understanding/return demonstration of topics presented as noted by education test scores.  Learning Barriers/Preferences: Learning Barriers/Preferences - 03/14/17 1434      Learning Barriers/Preferences   Learning Barriers  Hearing;Exercise Concerns  Learning Preferences  None       Education Topics:  AED/CPR: - Group verbal and written instruction with the use of models to demonstrate the basic use of the AED with the basic ABC's of resuscitation.   Cardiac Rehab from 04/18/2017 in [facility] Cardiac and Pulmonary Rehab  Date  04/04/17  Educator  MA  Instruction Review Code  1- Verbalizes Understanding      General Nutrition Guidelines/Fats and Fiber: -Group instruction provided by verbal, written material, models and posters to present the general  guidelines for heart healthy nutrition. Gives an explanation and review of dietary fats and fiber.   Pulmonary Rehab from 07/02/2015 in [facility] Cardiac and Pulmonary Rehab  Date  03/31/15  Educator  C. Lenny Pastel, RD  Instruction Review Code (retired)  2- meets goals/outcomes      Controlling Sodium/Reading Food Labels: -Group verbal and written material supporting the discussion of sodium use in heart healthy nutrition. Review and explanation with models, verbal and written materials for utilization of the food label.   Cardiac Rehab from 04/18/2017 in [facility] Cardiac and Pulmonary Rehab  Date  03/28/17  Educator  PI  Instruction Review Code  1- Verbalizes Understanding      Exercise Physiology & General Exercise Guidelines: - Group verbal and written instruction with models to review the exercise physiology of the cardiovascular system and associated critical values. Provides general exercise guidelines with specific guidelines to those with heart or lung disease.    Cardiac Rehab from 04/18/2017 in [facility] Cardiac and Pulmonary Rehab  Date  04/11/17  Educator  [facility]  Instruction Review Code  1- Verbalizes Understanding      Aerobic Exercise & Resistance Training: - Gives group verbal and written instruction on the various components of exercise. Focuses on aerobic and resistive training programs and the benefits of this training and how to safely progress through these programs..   Cardiac Rehab from 04/18/2017 in [facility] Cardiac and Pulmonary Rehab  Date  04/18/17  Educator  AS  Instruction Review Code  1- Verbalizes Understanding      Flexibility, Balance, Mind/Body Relaxation: Provides group verbal/written instruction on the benefits of flexibility and balance training, including mind/body exercise modes such as yoga, pilates and tai chi.  Demonstration and skill practice provided.   Pulmonary Rehab from 07/02/2015 in [facility] Cardiac and Pulmonary Rehab  Date  07/02/15  Educator  Salley Hews, PT   Instruction Review Code (retired)  2- meets goals/outcomes      Stress and Anxiety: - Provides group verbal and written instruction about the health risks of elevated stress and causes of high stress.  Discuss the correlation between heart/lung disease and anxiety and treatment options. Review healthy ways to manage with stress and anxiety.   Pulmonary Rehab from 07/02/2015 in [facility] Cardiac and Pulmonary Rehab  Date  04/16/15  Educator  [facility]  Instruction Review Code (retired)  2- meets goals/outcomes      Depression: - Provides group verbal and written instruction on the correlation between heart/lung disease and depressed mood, treatment options, and the stigmas associated with seeking treatment.   Cardiac Rehab from 04/18/2017 in [facility] Cardiac and Pulmonary Rehab  Date  04/13/17  Educator  [facility]  Instruction Review Code  1- Verbalizes Understanding      Anatomy & Physiology of the Heart: - Group verbal and written instruction and models provide basic cardiac anatomy and physiology, with the coronary electrical and arterial systems. Review of Valvular disease and Heart Failure   Pulmonary  Rehab from 07/02/2015 in [facility] Cardiac and Pulmonary Rehab  Date  02/28/15  Educator  CE  Instruction Review Code (retired)  2- meets goals/outcomes      Cardiac Procedures: - Group verbal and written instruction to review commonly prescribed medications for heart disease. Reviews the medication, class of the drug, and side effects. Includes the steps to properly store meds and maintain the prescription regimen. (beta blockers and nitrates)   Cardiac Medications I: - Group verbal and written instruction to review commonly prescribed medications for heart disease. Reviews the medication, class of the drug, and side effects. Includes the steps to properly store meds and maintain the prescription regimen.   Pulmonary Rehab from 07/02/2015 in [facility] Cardiac and Pulmonary Rehab  Date  06/27/15  Educator  CE   Instruction Review Code (retired)  2- meets goals/outcomes      Cardiac Medications II: -Group verbal and written instruction to review commonly prescribed medications for heart disease. Reviews the medication, class of the drug, and side effects. (all other drug classes)    Go Sex-Intimacy & Heart Disease, Get SMART - Goal Setting: - Group verbal and written instruction through game format to discuss heart disease and the return to sexual intimacy. Provides group verbal and written material to discuss and apply goal setting through the application of the S.M.A.R.T. Method.   Other Matters of the Heart: - Provides group verbal, written materials and models to describe Stable Angina and Peripheral Artery. Includes description of the disease process and treatment options available to the cardiac patient.   Exercise & Equipment Safety: - Individual verbal instruction and demonstration of equipment use and safety with use of the equipment.   Cardiac Rehab from 04/18/2017 in [facility] Cardiac and Pulmonary Rehab  Date  03/14/17  Educator  [facility]  Instruction Review Code  1- Verbalizes Understanding      Infection Prevention: - Provides verbal and written material to individual with discussion of infection control including proper hand washing and proper equipment cleaning during exercise session.   Cardiac Rehab from 04/18/2017 in [facility] Cardiac and Pulmonary Rehab  Date  03/14/17  Educator  [facility]  Instruction Review Code  1- Verbalizes Understanding      Falls Prevention: - Provides verbal and written material to individual with discussion of falls prevention and safety.   Cardiac Rehab from 04/18/2017 in [facility] Cardiac and Pulmonary Rehab  Date  03/14/17  Educator  [facility]  Instruction Review Code  1- Verbalizes Understanding      Diabetes: - Individual verbal and written instruction to review signs/symptoms of diabetes, desired ranges of glucose level fasting, after meals and with exercise.  Acknowledge that pre and post exercise glucose checks will be done for 3 sessions at entry of program.   Cardiac Rehab from 04/18/2017 in [facility] Cardiac and Pulmonary Rehab  Date  03/14/17  Educator  [facility]  Instruction Review Code  1- Verbalizes Understanding      Know Your Numbers and Risk Factors: -Group verbal and written instruction about important numbers in your health.  Discussion of what are risk factors and how they play a role in the disease process.  Review of Cholesterol, Blood Pressure, Diabetes, and BMI and the role they play in your overall health.   Sleep Hygiene: -Provides group verbal and written instruction about how sleep can affect your health.  Define sleep hygiene, discuss sleep cycles and impact of sleep habits. Review good sleep hygiene tips.    Cardiac Rehab from 04/18/2017 in [facility]  Cardiac and Pulmonary Rehab  Date  03/30/17  Educator  [facility]  Instruction Review Code  1- Verbalizes Understanding      Other: -Provides group and verbal instruction on various topics (see comments)   Knowledge Questionnaire Score: Knowledge Questionnaire Score - 03/14/17 1435      Knowledge Questionnaire Score   Pre Score  25/28 correct answers reviewed with Jeffrey Burgess       Core Components/Risk Factors/Patient Goals at Admission: Personal Goals and Risk Factors at Admission - 03/14/17 1423      Core Components/Risk Factors/Patient Goals on Admission    Weight Management  Yes;Obesity;Weight Loss    Intervention  Weight Management: Develop a combined nutrition and exercise program designed to reach desired caloric intake, while maintaining appropriate intake of nutrient and fiber, sodium and fats, and appropriate energy expenditure required for the weight goal.;Weight Management: Provide education and appropriate resources to help participant work on and attain dietary goals.;Weight Management/Obesity: Establish reasonable short term and long term weight goals.;Obesity: Provide education  and appropriate resources to help participant work on and attain dietary goals.    Admit Weight  254 lb 14.4 oz (115.6 kg)    Goal Weight: Short Term  250 lb (113.4 kg)    Goal Weight: Long Term  210 lb (95.3 kg)    Expected Outcomes  Short Term: Continue to assess and modify interventions until short term weight is achieved;Long Term: Adherence to nutrition and physical activity/exercise program aimed toward attainment of established weight goal;Weight Loss: Understanding of general recommendations for a balanced deficit meal plan, which promotes 1-2 lb weight loss per week and includes a negative energy balance of [phone] kcal/d;Understanding of distribution of calorie intake throughout the day with the consumption of 4-5 meals/snacks;Understanding recommendations for meals to include 15-35% energy as protein, 25-35% energy from fat, 35-60% energy from carbohydrates, less than 2104m of dietary cholesterol, 20-35 gm of total fiber daily    Tobacco Cessation  Yes    Number of packs per day  1 cig each morning    Intervention  Assist the participant in steps to quit. Provide individualized education and counseling about committing to Tobacco Cessation, relapse prevention, and pharmacological support that can be provided by physician.;OAdvice worker assist with locating and accessing local/national Quit Smoking programs, and support quit date choice.    Expected Outcomes  Short Term: Will demonstrate readiness to quit, by selecting a quit date.;Long Term: Complete abstinence from all tobacco products for at least 12 months from quit date.;Short Term: Will quit all tobacco product use, adhering to prevention of relapse plan.    Diabetes  Yes    Intervention  Provide education about signs/symptoms and action to take for hypo/hyperglycemia.;Provide education about proper nutrition, including hydration, and aerobic/resistive exercise prescription along with prescribed medications to achieve  blood glucose in normal ranges: Fasting glucose 65-99 mg/dL    Expected Outcomes  Short Term: Participant verbalizes understanding of the signs/symptoms and immediate care of hyper/hypoglycemia, proper foot care and importance of medication, aerobic/resistive exercise and nutrition plan for blood glucose control.;Long Term: Attainment of HbA1C < 7%.    Hypertension  Yes    Intervention  Provide education on lifestyle modifcations including regular physical activity/exercise, weight management, moderate sodium restriction and increased consumption of fresh fruit, vegetables, and low fat dairy, alcohol moderation, and smoking cessation.;Monitor prescription use compliance.    Expected Outcomes  Short Term: Continued assessment and intervention until BP is < 140/987mHG in hypertensive participants. < 130/8051mG  in hypertensive participants with diabetes, heart failure or chronic kidney disease.;Long Term: Maintenance of blood pressure at goal levels.    Lipids  Yes    Intervention  Provide education and support for participant on nutrition & aerobic/resistive exercise along with prescribed medications to achieve LDL <1m, HDL >448m    Expected Outcomes  Short Term: Participant states understanding of desired cholesterol values and is compliant with medications prescribed. Participant is following exercise prescription and nutrition guidelines.;Long Term: Cholesterol controlled with medications as prescribed, with individualized exercise RX and with personalized nutrition plan. Value goals: LDL < 7057mHDL > 40 mg.       Core Components/Risk Factors/Patient Goals Review:  Goals and Risk Factor Review    Row Name 04/18/17 1638             Core Components/Risk Factors/Patient Goals Review   Personal Goals Review  Weight Management/Obesity;Lipids;Hypertension;Diabetes;Improve shortness of breath with ADL's       Review  Jeffrey Burgess not lost weight yet but we discussed healthy weight loss priniples  and the difference between fat weight and muscle weight. He is taking all cardiac meds as prescribed to manage his diabetes, lipids, and blood pressure. He is attending cardiac rehab on a regular basis and meets with a dietician from the [facility]/year.        Expected Outcomes  Short: Weight goal 250 lbs Long: 210 lbs.           Core Components/Risk Factors/Patient Goals at Discharge (Final Review):  Goals and Risk Factor Review - 04/18/17 1638      Core Components/Risk Factors/Patient Goals Review   Personal Goals Review  Weight Management/Obesity;Lipids;Hypertension;Diabetes;Improve shortness of breath with ADL's    Review  Jeffrey Burgess not lost weight yet but we discussed healthy weight loss priniples and the difference between fat weight and muscle weight. He is taking all cardiac meds as prescribed to manage his diabetes, lipids, and blood pressure. He is attending cardiac rehab on a regular basis and meets with a dietician from the [facility] [place]/year.     Expected Outcomes  Short: Weight goal 250 lbs Long: 210 lbs.        ITP Comments: ITP Comments    Row Name 03/14/17 1400 03/23/17 0645 03/31/17 1409 04/20/17 0611     ITP Comments  Med review completed. Initial ITP created. Diagnosis can be found in [facility] from [facility] [place]/12/18  30 Day review. Continue with ITP unless directed changes per Medical Director review.   New to program  Jeffrey Burgess to say he is sorry that he can not attend Cardiac Rehab today but he didn't sleep well due to coughing and sneezing. I suggested that he call his MD also.   30 day review. Continue with ITP unless directed changes per Medical Director review.       Comments:

## 2017-04-21 DIAGNOSIS — I208 Other forms of angina pectoris: Secondary | ICD-10-CM

## 2017-04-21 DIAGNOSIS — I25118 Atherosclerotic heart disease of native coronary artery with other forms of angina pectoris: Secondary | ICD-10-CM | POA: Diagnosis not present

## 2017-04-21 NOTE — Progress Notes (Signed)
Daily Session Note  Patient Details  Name: Jeffrey Burgess MRN: [id number] Date of Birth: 1946-01-06 Referring Provider:     Cardiac Rehab from 03/14/2017 in [facility] Cardiac and Pulmonary Rehab  Referring Provider  [name] MD  [name]      Encounter Date: 04/21/2017  Check In: Session Check In - 04/21/17 1702      Check-In   Location  [facility]-Cardiac & Pulmonary Rehab    Staff Present  Earlean Shawl, BS, ACSM CEP, Exercise Physiologist;Meredith Sherryll Burger, RN BSN;Dotty Gonzalo Flavia Shipper    Supervising physician immediately available to respond to emergencies  See telemetry face sheet for immediately available ER MD    Medication changes reported      No    Fall or balance concerns reported     No    Tobacco Cessation  No Change    Warm-up and Cool-down  Performed on first and last piece of equipment    Resistance Training Performed  Yes    VAD Patient?  No      Pain Assessment   Currently in Pain?  No/denies          Social History   Tobacco Use  Smoking Status Current Every Day Smoker  . Packs/day: 0.25  . Years: 57.00  . Pack years: 14.25  . Types: Cigarettes  Smokeless Tobacco Former [place]   reports smoking 1 cig each morning.    Goals Met:  Independence with exercise equipment Exercise tolerated well No report of cardiac concerns or symptoms Strength training completed today  Goals Unmet:  Not Applicable  Comments: Pt able to follow exercise prescription today without complaint.  Will continue to monitor for progression.   Dr. Emily Filbert is Medical Director for [place] and [name].

## 2017-04-25 ENCOUNTER — Encounter: Payer: Non-veteran care | Attending: General Practice

## 2017-04-25 DIAGNOSIS — I208 Other forms of angina pectoris: Secondary | ICD-10-CM

## 2017-04-25 DIAGNOSIS — I25118 Atherosclerotic heart disease of native coronary artery with other forms of angina pectoris: Secondary | ICD-10-CM | POA: Diagnosis not present

## 2017-04-25 DIAGNOSIS — E119 Type 2 diabetes mellitus without complications: Secondary | ICD-10-CM | POA: Diagnosis not present

## 2017-04-25 NOTE — Progress Notes (Signed)
Daily Session Note  Patient Details  Name: Jeffrey Burgess MRN: [id number] Date of Birth: September 25, 1945 Referring Provider:     Cardiac Rehab from 03/14/2017 in [facility] Cardiac and Pulmonary Rehab  Referring Provider  [name] MD  [name]      Encounter Date: 04/25/2017  Check In: Session Check In - 04/25/17 1737      Check-In   Location  [facility]-Cardiac & Pulmonary Rehab    Staff Present  Renita Papa, RN Moises Blood, BS, ACSM CEP, Exercise Physiologist;Reon Hunley Oletta Darter, BA, ACSM CEP, Exercise Physiologist;Carroll Enterkin, RN, BSN    Supervising physician immediately available to respond to emergencies  See telemetry face sheet for immediately available ER MD    Medication changes reported      No    Fall or balance concerns reported     No    Warm-up and Cool-down  Performed on first and last piece of equipment    Resistance Training Performed  Yes    VAD Patient?  No      Pain Assessment   Currently in Pain?  No/denies    Multiple Pain Sites  No          Social History   Tobacco Use  Smoking Status Current Every Day Smoker  . Packs/day: 0.25  . Years: 57.00  . Pack years: 14.25  . Types: Cigarettes  Smokeless Tobacco Former [place]   reports smoking 1 cig each morning.    Goals Met:  Independence with exercise equipment Exercise tolerated well No report of cardiac concerns or symptoms Strength training completed today  Goals Unmet:  Not Applicable  Comments: Reviewed home exercise with pt today.  Pt plans to use bike and TM at home for exercise.  Reviewed THR, pulse, RPE, sign and symptoms, NTG use, and when to call 911 or MD.  Also discussed weather considerations and indoor options.  Pt voiced understanding.    Dr. Emily Filbert is Medical Director for [place] and [name].

## 2017-04-27 DIAGNOSIS — I208 Other forms of angina pectoris: Secondary | ICD-10-CM

## 2017-04-27 DIAGNOSIS — I25118 Atherosclerotic heart disease of native coronary artery with other forms of angina pectoris: Secondary | ICD-10-CM | POA: Diagnosis not present

## 2017-04-27 NOTE — Progress Notes (Signed)
Daily Session Note  Patient Details  Name: Jeffrey Burgess MRN: [id number] Date of Birth: 05/31/1945 Referring Provider:     Cardiac Rehab from 03/14/2017 in [facility] Cardiac and Pulmonary Rehab  Referring Provider  [name] MD  [name]      Encounter Date: 04/27/2017  Check In: Session Check In - 04/27/17 1632      Check-In   Location  [facility]-Cardiac & Pulmonary Rehab    Staff Present  Renita Papa, RN Vickki Hearing, BA, ACSM CEP, Exercise Physiologist;Carroll Enterkin, RN, BSN    Supervising physician immediately available to respond to emergencies  See telemetry face sheet for immediately available ER MD    Medication changes reported      No    Fall or balance concerns reported     No    Warm-up and Cool-down  Performed on first and last piece of equipment    Resistance Training Performed  Yes    VAD Patient?  No      Pain Assessment   Currently in Pain?  No/denies    Multiple Pain Sites  No        Exercise Prescription Changes - 04/27/17 1500      Response to Exercise   Blood Pressure (Admit)  144/73    Blood Pressure (Exercise)  134/72    Heart Rate (Admit)  85 bpm    Heart Rate (Exercise)  122 bpm    Heart Rate (Exit)  61 bpm    Rating of Perceived Exertion (Exercise)  13    Symptoms  none    Intensity  THRR unchanged      Progression   Progression  Continue to progress workloads to maintain intensity without signs/symptoms of physical distress.    Average METs  3.75      Resistance Training   Training Prescription  Yes    Weight  3 lb    Reps  10-15      Interval Training   Interval Training  No      NuStep   Level  7    SPM  80    Minutes  15    METs  3.6      Arm Ergometer   Level  7    Minutes  15    METs  3.9      Home Exercise Plan   Plans to continue exercise at  Home (comment) TM, bike    Frequency  Add 2 additional days to program exercise sessions.    Initial Home Exercises Provided  04/25/17       Social History    Tobacco Use  Smoking Status Current Every Day Smoker  . Packs/day: 0.25  . Years: 57.00  . Pack years: 14.25  . Types: Cigarettes  Smokeless Tobacco Former [place]   reports smoking 1 cig each morning.    Goals Met:  Independence with exercise equipment Exercise tolerated well No report of cardiac concerns or symptoms Strength training completed today  Goals Unmet:  Not Applicable  Comments: Pt able to follow exercise prescription today without complaint.  Will continue to monitor for progression.    Dr. Emily Filbert is Medical Director for [place] and [name].

## 2017-04-28 ENCOUNTER — Encounter: Payer: Non-veteran care | Admitting: *Deleted

## 2017-04-28 DIAGNOSIS — I25118 Atherosclerotic heart disease of native coronary artery with other forms of angina pectoris: Secondary | ICD-10-CM | POA: Diagnosis not present

## 2017-04-28 DIAGNOSIS — I208 Other forms of angina pectoris: Secondary | ICD-10-CM

## 2017-04-28 NOTE — Progress Notes (Signed)
Daily Session Note  Patient Details  Name: Jeffrey Burgess MRN: [id number] Date of Birth: 10/04/1945 Referring Provider:     Cardiac Rehab from 03/14/2017 in [facility] Cardiac and Pulmonary Rehab  Referring Provider  Jeffrey Carney MD  [facility]      Encounter Date: 04/28/2017  Check In: Session Check In - 04/28/17 1721      Check-In   Location  [facility]-Cardiac & Pulmonary Rehab    Staff Present  Jeffrey Papa, RN Jeffrey Burgess, BS, ACSM CEP, Exercise Physiologist;Jeffrey Kellie Shropshire, RN, BSN, MA    Supervising physician immediately available to respond to emergencies  See telemetry face sheet for immediately available ER MD    Medication changes reported      No    Fall or balance concerns reported     No    Warm-up and Cool-down  Performed on first and last piece of equipment    Resistance Training Performed  Yes    VAD Patient?  No      Pain Assessment   Currently in Pain?  No/denies          Social History   Tobacco Use  Smoking Status Current Every Day Smoker  . Packs/day: 0.25  . Years: 57.00  . Pack years: 14.25  . Types: Cigarettes  Smokeless Tobacco Former [employer]   reports smoking 1 cig each morning.    Goals Met:  Independence with exercise equipment Exercise tolerated well No report of cardiac concerns or symptoms Strength training completed today  Goals Unmet:  Not Applicable  Comments: Pt able to follow exercise prescription today without complaint.  Will continue to monitor for progression.    Dr. Emily Burgess is Medical Director for [place] and [name].

## 2017-05-02 ENCOUNTER — Encounter: Payer: Non-veteran care | Admitting: *Deleted

## 2017-05-02 DIAGNOSIS — I208 Other forms of angina pectoris: Secondary | ICD-10-CM

## 2017-05-02 DIAGNOSIS — I25118 Atherosclerotic heart disease of native coronary artery with other forms of angina pectoris: Secondary | ICD-10-CM | POA: Diagnosis not present

## 2017-05-02 NOTE — Progress Notes (Signed)
Daily Session Note  Patient Details  Name: PALMER FAHRNER MRN: [id number] Date of Birth: 1946/02/18 Referring Provider:     Cardiac Rehab from 03/14/2017 in [facility] Cardiac and Pulmonary Rehab  Referring Provider  [name] MD  [name]      Encounter Date: 05/02/2017  Check In: Session Check In - 05/02/17 1737      Check-In   Location  [facility]-Cardiac & Pulmonary Rehab    Staff Present  Renita Papa, RN Moises Blood, BS, ACSM CEP, Exercise Physiologist;Amanda Oletta Darter, BA, ACSM CEP, Exercise Physiologist;Carroll Enterkin, RN, BSN    Supervising physician immediately available to respond to emergencies  See telemetry face sheet for immediately available ER MD    Medication changes reported      No    Fall or balance concerns reported     No    Warm-up and Cool-down  Performed on first and last piece of equipment    Resistance Training Performed  Yes    VAD Patient?  No      Pain Assessment   Currently in Pain?  No/denies    Multiple Pain Sites  No          Social History   Tobacco Use  Smoking Status Current Every Day Smoker  . Packs/day: 0.25  . Years: 57.00  . Pack years: 14.25  . Types: Cigarettes  Smokeless Tobacco Former [place]   reports smoking 1 cig each morning.    Goals Met:  Independence with exercise equipment Exercise tolerated well No report of cardiac concerns or symptoms Strength training completed today  Goals Unmet:  Not Applicable  Comments: Pt able to follow exercise prescription today without complaint.  Will continue to monitor for progression.    Dr. Emily Filbert is Medical Director for [place] and [name].

## 2017-05-09 DIAGNOSIS — I208 Other forms of angina pectoris: Secondary | ICD-10-CM

## 2017-05-09 DIAGNOSIS — I25118 Atherosclerotic heart disease of native coronary artery with other forms of angina pectoris: Secondary | ICD-10-CM | POA: Diagnosis not present

## 2017-05-09 NOTE — Progress Notes (Signed)
Daily Session Note  Patient Details  Name: Jeffrey Burgess MRN: [id number] Date of Birth: December 10, 1945 Referring Provider:     Cardiac Rehab from 03/14/2017 in [facility] Cardiac and Pulmonary Rehab  Referring Provider  Jeffrey Carney MD  [facility]      Encounter Date: 05/09/2017  Check In: Session Check In - 05/09/17 1728      Check-In   Location  [facility]-Cardiac & Pulmonary Rehab    Staff Present  Renita Papa, RN Moises Blood, BS, ACSM CEP, Exercise Physiologist;Amanda Oletta Darter, BA, ACSM CEP, Exercise Physiologist;Carroll Enterkin, RN, BSN    Supervising physician immediately available to respond to emergencies  See telemetry face sheet for immediately available ER MD    Medication changes reported      No    Fall or balance concerns reported     No    Warm-up and Cool-down  Performed on first and last piece of equipment    Resistance Training Performed  Yes    VAD Patient?  No      Pain Assessment   Currently in Pain?  No/denies    Multiple Pain Sites  No          Social History   Tobacco Use  Smoking Status Current Every Day Smoker  . Packs/day: 0.25  . Years: 57.00  . Pack years: 14.25  . Types: Cigarettes  Smokeless Tobacco Former [place]   reports smoking 1 cig each morning.    Goals Met:  Independence with exercise equipment Exercise tolerated well No report of cardiac concerns or symptoms Strength training completed today  Goals Unmet:  Not Applicable  Comments: Pt able to follow exercise prescription today without complaint.  Will continue to monitor for progression.    Dr. Emily Filbert is Medical Director for [place] and [name].

## 2017-05-11 ENCOUNTER — Encounter: Payer: Non-veteran care | Admitting: *Deleted

## 2017-05-11 ENCOUNTER — Encounter: Payer: Self-pay | Admitting: *Deleted

## 2017-05-11 DIAGNOSIS — I25118 Atherosclerotic heart disease of native coronary artery with other forms of angina pectoris: Secondary | ICD-10-CM | POA: Diagnosis not present

## 2017-05-11 DIAGNOSIS — I208 Other forms of angina pectoris: Secondary | ICD-10-CM

## 2017-05-11 DIAGNOSIS — I10 Essential (primary) hypertension: Secondary | ICD-10-CM

## 2017-05-11 NOTE — Progress Notes (Signed)
Daily Session Note  Patient Details  Name: Jeffrey Burgess MRN: [id number] Date of Birth: 31-Mar-1945 Referring Provider:     Cardiac Rehab from 03/14/2017 in [facility] Cardiac and Pulmonary Rehab  Referring Provider  [name] MD  [name]      Encounter Date: 05/11/2017  Check In: Session Check In - 05/11/17 1652      Check-In   Location  [facility]-Cardiac & Pulmonary Rehab    Staff Present  Renita Papa, RN BSN;Jenna Ardoin, RN, BSN    Supervising physician immediately available to respond to emergencies  See telemetry face sheet for immediately available ER MD    Medication changes reported      No    Fall or balance concerns reported     No    Tobacco Cessation  No Change    Warm-up and Cool-down  Performed on first and last piece of equipment    Resistance Training Performed  Yes    VAD Patient?  No      Pain Assessment   Currently in Pain?  No/denies        Exercise Prescription Changes - 05/11/17 1500      Response to Exercise   Blood Pressure (Admit)  138/70    Blood Pressure (Exercise)  194/78    Blood Pressure (Exit)  124/64    Heart Rate (Admit)  92 bpm    Heart Rate (Exercise)  117 bpm    Heart Rate (Exit)  93 bpm    Rating of Perceived Exertion (Exercise)  13    Symptoms  none    Intensity  THRR unchanged      Progression   Progression  Continue to progress workloads to maintain intensity without signs/symptoms of physical distress.    Average METs  2.8      Resistance Training   Training Prescription  Yes    Weight  3 lb    Reps  10-15      Treadmill   MPH  2    Grade  1    Minutes  15    METs  2.8      NuStep   Level  7    SPM  80    Minutes  15       Social History   Tobacco Use  Smoking Status Current Every Day Smoker  . Packs/day: 0.25  . Years: 57.00  . Pack years: 14.25  . Types: Cigarettes  Smokeless Tobacco Former [place]   reports smoking 1 cig each morning.    Goals Met:  Proper associated with  RPD/PD & O2 Sat Changing diet to healthy choices, watching portion sizes Exercise tolerated well No report of cardiac concerns or symptoms Strength training completed today  Goals Unmet:  Not Applicable  Comments:     Dr. Emily Filbert is Medical Director for [place] and [name].

## 2017-05-11 NOTE — Progress Notes (Unsigned)
Daily Session Note  Patient Details  Name: Jeffrey Burgess MRN: [id number] Date of Birth: November 02, 1945 Referring Provider:     Cardiac Rehab from 03/14/2017 in [facility] Cardiac and Pul[facility] Rehab  Referring Provider  Jeffrey Carney MD  [facility]      Encounter Date: 05/11/2017  Check In: Session Check In - 05/11/17 1626      Check-In   Location  [facility]-Cardiac & Pulmonary Rehab    Staff Present  Gerlene Burdock, RN, BSN;Meredith Sherryll Burger, RN Vickki Hearing, BA, ACSM CEP, Exercise Physiologist    Supervising physician immediately available to respond to emergencies  See telemetry face sheet for immediately available ER MD    Medication changes reported      No    Fall or balance concerns reported     No    Tobacco Cessation  No Change    Warm-up and Cool-down  Performed on first and last piece of equipment    Resistance Training Performed  Yes    VAD Patient?  No      Pain Assessment   Currently in Pain?  No/denies        Exercise Prescription Changes - 05/11/17 1500      Response to Exercise   Blood Pressure (Admit)  138/70    Blood Pressure (Exercise)  194/78    Blood Pressure (Exit)  124/64    Heart Rate (Admit)  92 bpm    Heart Rate (Exercise)  117 bpm    Heart Rate (Exit)  93 bpm    Rating of Perceived Exertion (Exercise)  13    Symptoms  none    Intensity  THRR unchanged      Progression   Progression  Continue to progress workloads to maintain intensity without signs/symptoms of physical distress.    Average METs  2.8      Resistance Training   Training Prescription  Yes    Weight  3 lb    Reps  10-15      Treadmill   MPH  2    Grade  1    Minutes  15    METs  2.8      NuStep   Level  7    SPM  80    Minutes  15       Social History   Tobacco Use  Smoking Status Current Every Day Smoker  . Packs/day: 0.25  . Years: 57.00  . Pack years: 14.25  . Types: Cigarettes  Smokeless Tobacco Former [place]   reports smoking 1 cig each  morning.    Goals Met:  Proper associated with RPD/PD & O2 Sat Exercise tolerated well No report of cardiac concerns or symptoms Strength training completed today  Goals Unmet:  Not Applicable  Comments:     Dr. Emily Filbert is Medical Director for [place] and [name].

## 2017-05-12 DIAGNOSIS — I208 Other forms of angina pectoris: Secondary | ICD-10-CM

## 2017-05-12 DIAGNOSIS — I2089 Other forms of angina pectoris: Secondary | ICD-10-CM

## 2017-05-12 DIAGNOSIS — I25118 Atherosclerotic heart disease of native coronary artery with other forms of angina pectoris: Secondary | ICD-10-CM | POA: Diagnosis not present

## 2017-05-12 NOTE — Progress Notes (Signed)
Daily Session Note  Patient Details  Name: Jeffrey Burgess MRN: [id number] Date of Birth: 20-Nov-1945 Referring Provider:     Cardiac Rehab from 03/14/2017 in [facility] Cardiac and Pulmonary Rehab  Referring Provider  [name] MD  [name]      Encounter Date: 05/12/2017  Check In: Session Check In - 05/12/17 1647      Check-In   Location  [facility]-Cardiac & Pulmonary Rehab    Staff Present  Earlean Shawl, BS, ACSM CEP, Exercise Physiologist;Meredith Sherryll Burger, RN BSN;Magdalen Cabana Flavia Shipper    Supervising physician immediately available to respond to emergencies  See telemetry face sheet for immediately available ER MD    Medication changes reported      No    Fall or balance concerns reported     No    Tobacco Cessation  No Change    Warm-up and Cool-down  Performed on first and last piece of equipment    Resistance Training Performed  Yes    VAD Patient?  No      Pain Assessment   Currently in Pain?  No/denies          Social History   Tobacco Use  Smoking Status Current Every Day Smoker  . Packs/day: 0.25  . Years: 57.00  . Pack years: 14.25  . Types: Cigarettes  Smokeless Tobacco Former [place]   reports smoking 1 cig each morning.    Goals Met:  Independence with exercise equipment Exercise tolerated well No report of cardiac concerns or symptoms Strength training completed today  Goals Unmet:  Not Applicable  Comments: Pt able to follow exercise prescription today without complaint.  Will continue to monitor for progression.   Dr. Emily Filbert is Medical Director for [place] and [name].

## 2017-05-16 ENCOUNTER — Encounter: Payer: Non-veteran care | Admitting: *Deleted

## 2017-05-16 DIAGNOSIS — I25118 Atherosclerotic heart disease of native coronary artery with other forms of angina pectoris: Secondary | ICD-10-CM | POA: Diagnosis not present

## 2017-05-16 DIAGNOSIS — I208 Other forms of angina pectoris: Secondary | ICD-10-CM

## 2017-05-16 NOTE — Progress Notes (Signed)
Daily Session Note  Patient Details  Name: Jeffrey Burgess MRN: [id number] Date of Birth: 08-01-1945 Referring Provider:     Cardiac Rehab from 03/14/2017 in [facility] Cardiac and Pulmonary Rehab  Referring Provider  [name] MD  [name]      Encounter Date: 05/16/2017  Check In: Session Check In - 05/16/17 1732      Check-In   Location  [facility]-Cardiac & Pulmonary Rehab    Staff Present  Nada Maclachlan, BA, ACSM CEP, Exercise Physiologist;Klaira Pesci Amedeo Plenty, BS, ACSM CEP, Exercise Physiologist;Meredith Sherryll Burger, RN BSN;Carroll Enterkin, RN, BSN    Supervising physician immediately available to respond to emergencies  See telemetry face sheet for immediately available ER MD    Medication changes reported      No    Fall or balance concerns reported     No    Warm-up and Cool-down  Performed on first and last piece of equipment    Resistance Training Performed  Yes    VAD Patient?  No      Pain Assessment   Currently in Pain?  No/denies    Multiple Pain Sites  No          Social History   Tobacco Use  Smoking Status Current Every Day Smoker  . Packs/day: 0.25  . Years: 57.00  . Pack years: 14.25  . Types: Cigarettes  Smokeless Tobacco Former [place]   reports smoking 1 cig each morning.    Goals Met:  Independence with exercise equipment Exercise tolerated well No report of cardiac concerns or symptoms Strength training completed today  Goals Unmet:  Not Applicable  Comments:Pt able to follow exercise prescription today without complaint.  Will continue to monitor for progression.      Dr. Emily Filbert is Medical Director for [place] and [name].

## 2017-05-18 ENCOUNTER — Encounter: Payer: Self-pay | Admitting: *Deleted

## 2017-05-18 DIAGNOSIS — I25118 Atherosclerotic heart disease of native coronary artery with other forms of angina pectoris: Secondary | ICD-10-CM | POA: Diagnosis not present

## 2017-05-18 DIAGNOSIS — I208 Other forms of angina pectoris: Secondary | ICD-10-CM

## 2017-05-18 DIAGNOSIS — I2089 Other forms of angina pectoris: Secondary | ICD-10-CM

## 2017-05-18 NOTE — Progress Notes (Signed)
Cardiac Individual Treatment Plan  Patient Details  Name: Jeffrey Burgess MRN: [id number] Date of Birth: 1945-05-10 Referring Provider:     Cardiac Rehab from 03/14/2017 in [facility] Cardiac and Pulmonary Rehab  Referring Provider  [name] MD  [name]      Initial Encounter Date:    Cardiac Rehab from 03/14/2017 in [facility] Cardiac and Pulmonary Rehab  Date  03/14/17  Referring Provider  [name] MD  [name]      Visit Diagnosis: Stable angina ([place])  Patient's Home Medications on Admission:  Current Outpatient Medications:  .  acetaminophen (TYLENOL) 500 MG tablet, Take 500 mg by mouth., Disp: , Rfl:  .  albuterol (PROAIR HFA) 108 (90 Base) MCG/ACT inhaler, Inhale 2 puffs into the lungs., Disp: , Rfl:  .  aspirin EC 81 MG tablet, Take 81 mg by mouth., Disp: , Rfl:  .  beclomethasone (BECONASE-AQ) 42 MCG/SPRAY nasal spray, Place 2 sprays into the nose., Disp: , Rfl:  .  benzonatate (TESSALON) 200 MG capsule, Take 200 mg by mouth 3 (three) times daily as needed for cough., Disp: , Rfl:  .  budesonide-formoterol (SYMBICORT) 160-4.5 MCG/ACT inhaler, Inhale 2 puffs into the lungs., Disp: , Rfl:  .  buPROPion (WELLBUTRIN) 100 MG tablet, Take 100 mg by mouth., Disp: , Rfl:  .  busPIRone (BUSPAR) 10 MG tablet, Take 10 mg by mouth 3 (three) times daily. , Disp: , Rfl:  .  cetirizine (ZYRTEC) 10 MG tablet, Take 10 mg by mouth daily., Disp: , Rfl:  .  Cholecalciferol (VITAMIN D3 SUPER STRENGTH) 2000 units TABS, Take 2,000 Units by mouth., Disp: , Rfl:  .  docusate sodium (STOOL SOFTENER) 100 MG capsule, Take 100 mg by mouth., Disp: , Rfl:  .  fluticasone (FLONASE) 50 MCG/ACT nasal spray, Place 1 spray into the nose., Disp: , Rfl:  .  glucose blood test strip, 1 each by Other route as needed for other. Use as instructed, Disp: , Rfl:  .  insulin aspart protamine - aspart (NOVOLOG 70/30 MIX) (70-30) 100 UNIT/ML FlexPen, Inject 18 Units into the skin., Disp: , Rfl:  .  lisinopril  (PRINIVIL,ZESTRIL) 10 MG tablet, Take 10 mg by mouth daily., Disp: , Rfl:  .  metFORMIN (GLUCOPHAGE) 500 MG tablet, Take 500 mg by mouth 2 (two) times daily after a meal. , Disp: , Rfl:  .  metoprolol succinate (TOPROL-XL) 100 MG 24 hr tablet, Take 100 mg by mouth daily. Take with or immediately following a meal., Disp: , Rfl:  .  metoprolol tartrate (LOPRESSOR) 25 MG tablet, Take 25 mg by mouth., Disp: , Rfl:  .  montelukast (SINGULAIR) 10 MG tablet, Take 10 mg by mouth., Disp: , Rfl:  .  nicotine polacrilex (COMMIT) 2 MG lozenge, Place inside cheek., Disp: , Rfl:  .  nitroGLYCERIN (NITROSTAT) 0.4 MG SL tablet, Place 0.4 mg under the tongue., Disp: , Rfl:  .  Omega-3 1000 MG CAPS, Take 2 g by mouth., Disp: , Rfl:  .  pramipexole (MIRAPEX) 0.5 MG tablet, Take 0.5 mg by mouth., Disp: , Rfl:  .  pregabalin (LYRICA) 100 MG capsule, Take 200 mg by mouth every morning. , Disp: , Rfl:  .  ranitidine (ZANTAC) 150 MG capsule, Take 150 mg by mouth 2 (two) times daily. , Disp: , Rfl:  .  rosuvastatin (CRESTOR) 20 MG tablet, Take 20 mg by mouth., Disp: , Rfl:  .  tiotropium (SPIRIVA) 18 MCG inhalation capsule, Place 18 mcg into  inhaler and inhale., Disp: , Rfl:  .  VITAMIN B COMPLEX-C CAPS, Take 1 tablet by mouth., Disp: , Rfl:  .  vitamin E 400 UNIT capsule, Take 400 Units by mouth., Disp: , Rfl:   Past Medical History: Past Medical History:  Diagnosis Date  . Asthma   . Diabetes ([place])   . Hyperlipidemia   . Hypertension   . Myocardial infarction ([place])   . Rheumatoid arthritis ([place])   . Sleep apnea     Tobacco Use: Social History   Tobacco Use  Smoking Status Current Every Day Smoker  . Packs/day: 0.25  . Years: 57.00  . Pack years: 14.25  . Types: Cigarettes  Smokeless Tobacco Former [place]   reports smoking 1 cig each morning.    Labs: Recent Review Flowsheet Data    There is no flowsheet data to display.       Exercise Target Goals:    Exercise Program  Goal: Individual exercise prescription set using results from initial 6 min walk test and THRR while considering  patient's activity barriers and safety.   Exercise Prescription Goal: Initial exercise prescription builds to 30-45 minutes a day of aerobic activity, 2-3 days per week.  Home exercise guidelines will be given to patient during program as part of exercise prescription that the participant will acknowledge.  Activity Barriers & Risk Stratification: Activity Barriers & Cardiac Risk Stratification - 03/14/17 1436      Activity Barriers & Cardiac Risk Stratification   Activity Barriers  Arthritis;Back Problems;Shortness of Breath;Muscular Weakness    Cardiac Risk Stratification  Moderate       6 Minute Walk: 6 Minute Walk    Row Name 03/14/17 1427         6 Minute Walk   Phase  Initial     Distance  902 feet     Walk Time  6 minutes     # of Rest Breaks  0     MPH  1.71     METS  2.05     RPE  13     Perceived Dyspnea   2     VO2 Peak  7.17     Symptoms  Yes (comment)     Comments  SOB     Resting HR  67 bpm     Resting BP  126/64     Resting Oxygen Saturation   98 %     Exercise Oxygen Saturation  during 6 min walk  95 %     Max Ex. HR  104 bpm     Max Ex. BP  176/84     2 Minute Post BP  132/76        Oxygen Initial Assessment:   Oxygen Re-Evaluation:   Oxygen Discharge (Final Oxygen Re-Evaluation):   Initial Exercise Prescription: Initial Exercise Prescription - 03/14/17 1400      Date of Initial Exercise RX and Referring Provider   Date  03/14/17    Referring Provider  [name] MD  [facility]      Treadmill   MPH  1.4    Grade  0.5    Minutes  15    METs  2.17      Arm Ergometer   Level  1    Watts  38    RPM  25    Minutes  15    METs  2      T5 Nustep   Level  1    SPM  80    Minutes  15    METs  2      Prescription Details   Frequency (times per week)  3    Duration  Progress to 45 minutes of aerobic exercise without  signs/symptoms of physical distress      Intensity   THRR 40-80% of Max Heartrate  101-134    Ratings of Perceived Exertion  11-13    Perceived Dyspnea  0-4      Progression   Progression  Continue to progress workloads to maintain intensity without signs/symptoms of physical distress.      Resistance Training   Training Prescription  Yes    Weight  3 lbs    Reps  10-15       Perform Capillary Blood Glucose checks as needed.  Exercise Prescription Changes: Exercise Prescription Changes    Row Name 03/14/17 1400 03/30/17 1400 04/13/17 1500 04/25/17 1700 04/27/17 1500     Response to Exercise   Blood Pressure (Admit)  126/64  140/70  124/63  -  144/73   Blood Pressure (Exercise)  176/84  162/80  174/74  -  134/72   Blood Pressure (Exit)  132/76  130/60  134/74  -  -   Heart Rate (Admit)  67 bpm  68 bpm  78 bpm  -  85 bpm   Heart Rate (Exercise)  104 bpm  122 bpm  116 bpm  -  122 bpm   Heart Rate (Exit)  75 bpm  92 bpm  91 bpm  -  61 bpm   Oxygen Saturation (Admit)  98 %  -  -  -  -   Oxygen Saturation (Exercise)  95 %  -  -  -  -   Rating of Perceived Exertion (Exercise)  _0 -  13   Perceived Dyspnea (Exercise)  2  -  -  -  -   Symptoms  SOB  none  none  -  none   Comments  walk test results  -  -  -  -   Intensity  -  THRR unchanged  THRR unchanged  -  THRR unchanged     Progression   Progression  -  Continue to progress workloads to maintain intensity without signs/symptoms of physical distress.  Continue to progress workloads to maintain intensity without signs/symptoms of physical distress.  -  Continue to progress workloads to maintain intensity without signs/symptoms of physical distress.   Average METs  -  -  2.8  -  3.75     Resistance Training   Training Prescription  -  Yes  Yes  -  Yes   Weight  -  3 lb  3 lb  -  3 lb   Reps  -  10-15  10-15  -  10-15     Interval Training   Interval Training  -  -  -  -  No     Treadmill   MPH  -  -  1.4  -  -    Grade  -  -  0.5  -  -     NuStep   Level  -  1  -  -  7   SPM  -  80  -  -  80   Minutes  -  15  -  -  15   METs  -  -  -  -  3.6     Arm Ergometer   Level  -  1  1  -  7   Minutes  -  15  15  -  15   METs  -  -  3.4  -  3.9     Home Exercise Plan   Plans to continue exercise at  -  -  -  Home (comment) TM, bike  Home (comment) TM, bike   Frequency  -  -  -  Add 2 additional days to program exercise sessions.  Add 2 additional days to program exercise sessions.   Initial Home Exercises Provided  -  -  -  04/25/17  04/25/17   Row Name 05/11/17 1500             Response to Exercise   Blood Pressure (Admit)  138/70       Blood Pressure (Exercise)  194/78       Blood Pressure (Exit)  124/64       Heart Rate (Admit)  92 bpm       Heart Rate (Exercise)  117 bpm       Heart Rate (Exit)  93 bpm       Rating of Perceived Exertion (Exercise)  13       Symptoms  none       Intensity  THRR unchanged         Progression   Progression  Continue to progress workloads to maintain intensity without signs/symptoms of physical distress.       Average METs  2.8         Resistance Training   Training Prescription  Yes       Weight  3 lb       Reps  10-15         Treadmill   MPH  2       Grade  1       Minutes  15       METs  2.8         NuStep   Level  7       SPM  80       Minutes  15          Exercise Comments: Exercise Comments    Row Name 03/24/17 1714 04/25/17 1739         Exercise Comments  First full day of exercise!  Patient was oriented to gym and equipment including functions, settings, policies, and procedures.  Patient's individual exercise prescription and treatment plan were reviewed.  All starting workloads were established based on the results of the 6 minute walk test done at initial orientation visit.  The plan for exercise progression was also introduced and progression will be customized based on patient's performance and goals.  Reviewed home exercise  with pt today.  Pt plans to use bike and TM at home for exercise.  Reviewed THR, pulse, RPE, sign and symptoms, NTG use, and when to call 911 or MD.  Also discussed weather considerations and indoor options.  Pt voiced understanding.         Exercise Goals and Review: Exercise Goals    Row Name 03/14/17 1443             Exercise Goals   Increase Physical Activity  Yes       Intervention  Provide advice, education, support and counseling about physical activity/exercise needs.;Develop an individualized exercise prescription for aerobic and resistive  training based on initial evaluation findings, risk stratification, comorbidities and participant's personal goals.       Expected Outcomes  Short Term: Attend rehab on a regular basis to increase amount of physical activity.;Long Term: Add in home exercise to make exercise part of routine and to increase amount of physical activity.;Long Term: Exercising regularly at least 3-5 days a week.       Increase Strength and Stamina  Yes       Intervention  Provide advice, education, support and counseling about physical activity/exercise needs.;Develop an individualized exercise prescription for aerobic and resistive training based on initial evaluation findings, risk stratification, comorbidities and participant's personal goals.       Expected Outcomes  Short Term: Increase workloads from initial exercise prescription for resistance, speed, and METs.;Short Term: Perform resistance training exercises routinely during rehab and add in resistance training at home;Long Term: Improve cardiorespiratory fitness, muscular endurance and strength as measured by increased METs and functional capacity (6MWT)       Able to understand and use rate of perceived exertion (RPE) scale  Yes       Intervention  Provide education and explanation on how to use RPE scale       Expected Outcomes  Short Term: Able to use RPE daily in rehab to express subjective intensity  level;Long Term:  Able to use RPE to guide intensity level when exercising independently       Able to understand and use Dyspnea scale  Yes       Intervention  Provide education and explanation on how to use Dyspnea scale       Expected Outcomes  Short Term: Able to use Dyspnea scale daily in rehab to express subjective sense of shortness of breath during exertion;Long Term: Able to use Dyspnea scale to guide intensity level when exercising independently       Knowledge and understanding of Target Heart Rate Range (THRR)  Yes       Intervention  Provide education and explanation of THRR including how the numbers were predicted and where they are located for reference       Expected Outcomes  Short Term: Able to state/look up THRR;Long Term: Able to use THRR to govern intensity when exercising independently;Short Term: Able to use daily as guideline for intensity in rehab       Able to check pulse independently  Yes       Intervention  Provide education and demonstration on how to check pulse in carotid and radial arteries.;Review the importance of being able to check your own pulse for safety during independent exercise       Expected Outcomes  Short Term: Able to explain why pulse checking is important during independent exercise;Long Term: Able to check pulse independently and accurately       Understanding of Exercise Prescription  Yes       Intervention  Provide education, explanation, and written materials on patient's individual exercise prescription       Expected Outcomes  Short Term: Able to explain program exercise prescription;Long Term: Able to explain home exercise prescription to exercise independently          Exercise Goals Re-Evaluation : Exercise Goals Re-Evaluation    Row Name 03/24/17 1714 03/30/17 1432 04/13/17 1527 04/18/17 1644 04/25/17 1739     Exercise Goal Re-Evaluation   Exercise Goals Review  Understanding of Exercise Prescription;Knowledge and understanding of  Target Heart Rate Range (THRR);Able to understand and use rate of  perceived exertion (RPE) scale  Increase Physical Activity;Increase Strength and Stamina;Able to understand and use Dyspnea scale;Able to understand and use rate of perceived exertion (RPE) scale  Increase Physical Activity;Increase Strength and Stamina;Able to understand and use rate of perceived exertion (RPE) scale  Increase Physical Activity;Increase Strength and Stamina  Increase Physical Activity;Increase Strength and Stamina;Knowledge and understanding of Target Heart Rate Range (THRR);Able to understand and use rate of perceived exertion (RPE) scale;Able to check pulse independently;Understanding of Exercise Prescription   Comments  Reviewed RPE scale, THR and program prescription with pt today.  Pt voiced understanding and was given a copy of goals to take home.   Pt is tolerating exercise well.    Olaoluwa continues to tolerate exercise well. Staff will continue to monitor progress  Javarian states that he can walk further before resting now that he has been more consistant with his exercise.   Reviewed home exercise with pt today.  Pt plans to use bike and TM at home for exercise.  Reviewed THR, pulse, RPE, sign and symptoms, NTG use, and when to call 911 or MD.  Also discussed weather considerations and indoor options.  Pt voiced understanding.   Expected Outcomes  Short: Use RPE daily to regulate intensity.  Long: Follow program prescription in THR.   Short - Pt will continue to attend regularly Long - Pt will improve overall MET level  Short - Pt will attend classes regularly Long - Pt will maintain independent exercise  -  Short - Rudell will clean the clothes off his TM so he can use it Long - Sharad will exrecise on his own   Row Name 05/11/17 1547             Exercise Goal Re-Evaluation   Exercise Goals Review  Increase Physical Activity;Increase Strength and Stamina;Able to understand and use rate of perceived exertion (RPE)  scale;Knowledge and understanding of Target Heart Rate Range (THRR)       Comments  Leven tolerates exercise well. Staff will continue to monitor       Expected Outcomes  Short - Okie will continue to attend regularly  Long - Ares will maintain exercise on his own          Discharge Exercise Prescription (Final Exercise Prescription Changes): Exercise Prescription Changes - 05/11/17 1500      Response to Exercise   Blood Pressure (Admit)  138/70    Blood Pressure (Exercise)  194/78    Blood Pressure (Exit)  124/64    Heart Rate (Admit)  92 bpm    Heart Rate (Exercise)  117 bpm    Heart Rate (Exit)  93 bpm    Rating of Perceived Exertion (Exercise)  13    Symptoms  none    Intensity  THRR unchanged      Progression   Progression  Continue to progress workloads to maintain intensity without signs/symptoms of physical distress.    Average METs  2.8      Resistance Training   Training Prescription  Yes    Weight  3 lb    Reps  10-15      Treadmill   MPH  2    Grade  1    Minutes  15    METs  2.8      NuStep   Level  7    SPM  80    Minutes  15       Nutrition:  Target Goals: Understanding of nutrition guidelines,  daily intake of sodium <1532m, cholesterol <2040m calories 30% from fat and 7% or less from saturated fats, daily to have 5 or more servings of fruits and vegetables.  Biometrics: Pre Biometrics - 03/14/17 1444      Pre Biometrics   Height  5' 8.9" (1.75 m)    Weight  254 lb 14.4 oz (115.6 kg)    Waist Circumference  48 inches    Hip Circumference  48 inches    Waist to Hip Ratio  1 %    BMI (Calculated)  37.75    Single Leg Stand  3.34 seconds        Nutrition Therapy Plan and Nutrition Goals: Nutrition Therapy & Goals - 03/14/17 1432      Intervention Plan   Intervention  Prescribe, educate and counsel regarding individualized specific dietary modifications aiming towards targeted core components such as weight, hypertension, lipid  management, diabetes, heart failure and other comorbidities.;Nutrition handout(s) given to patient.    Expected Outcomes  Short Term Goal: Understand basic principles of dietary content, such as calories, fat, sodium, cholesterol and nutrients.;Short Term Goal: A plan has been developed with personal nutrition goals set during dietitian appointment.;Long Term Goal: Adherence to prescribed nutrition plan.       Nutrition Assessments: Nutrition Assessments - 05/16/17 1630      MEDFICTS Scores   Post Score  38       Nutrition Goals Re-Evaluation: Nutrition Goals Re-Evaluation    Row Name 04/18/17 1642 05/11/17 1554           Goals   Current Weight  -  256 lb (116.1 kg)      Nutrition Goal  LeKobioes not want to meet with the dietician in the cardiac rehab program. He states that he [facility] and is comfortable following the nutician advise he gets from that dietician.   LeNumanaid he knows what to eat and has met with the [facility] feels he doesn't need to meet with our dietician now.       Comment  -  LeCyrussaid that he "all my problems are due to [place] but reports his blood sugars upon awaking are 130 and before supper are 125-130.        Expected Outcome  -  Heart healthy eating          Nutrition Goals Discharge (Final Nutrition Goals Re-Evaluation): Nutrition Goals Re-Evaluation - 05/11/17 1554      Goals   Current Weight  256 lb (116.1 kg)    Nutrition Goal  LeWindelaid he knows what to eat and has met with the [facility] feels he doesn't need to meet with our dietician now.     Comment  LeBrylenaid that he "all my problems are due to [place] diabetes but reports his blood sugars upon awaking are 130 and before supper are 125-130.      Expected Outcome  Heart healthy eating        Psychosocial: Target Goals: Acknowledge presence or  absence of significant depression and/or stress, maximize coping skills, provide positive support system. Participant is able to verbalize types and ability to use techniques and skills needed for reducing stress and depression.   Initial Review & Psychosocial Screening: Initial Psych Review & Screening - 03/14/17 1433      Initial Review   Current  issues with  Current Sleep Concerns He and his wife have been having issues sleeping for the last 4 months      [place]?  Yes      Screening Interventions   Interventions  Encouraged to exercise;Program counselor consult    Expected Outcomes  Short Term goal: Utilizing psychosocial counselor, staff and physician to assist with identification of specific Stressors or current issues interfering with healing process. Setting desired goal for each stressor or current issue identified.;Long Term Goal: Stressors or current issues are controlled or eliminated.;Short Term goal: Identification and review with participant of any Quality of Life or Depression concerns found by scoring the questionnaire.;Long Term goal: The participant improves quality of Life and PHQ9 Scores as seen by post scores and/or verbalization of changes       Quality of Life Scores:  Quality of Life - 05/16/17 1629      Quality of Life Scores   Health/Function Post  24.6 %    Socioeconomic Post  29.06 %    Psych/Spiritual Post  28.93 %    Family Post  30 %    GLOBAL Post  27.26 %      Scores of 19 and below usually indicate a poorer quality of life in these areas.  A difference of  2-3 points is a clinically meaningful difference.  A difference of 2-3 points in the total score of the Quality of Life Index has been associated with significant improvement in overall quality of life, self-image, physical symptoms, and general health in studies assessing change in quality of life.  PHQ-9: Recent Review Flowsheet Data    Depression screen [facility] [place] 2/9  05/16/2017 03/14/2017 07/04/2015 06/27/2015 02/18/2015   Decreased Interest 0 0 0 0 0   Down, Depressed, Hopeless 0 0 0 0 0   PHQ - 2 Score 0 0 0 0 0   Altered sleeping 1 1 0 0 -   Tired, decreased energy _0 -   Change in appetite 0 0 0 0 -   Feeling bad or failure about yourself  0 0 1 1 -   Trouble concentrating 0 0 0 0 -   Moving slowly or fidgety/restless 0 0 0 0 -   Suicidal thoughts 0 0 0 0 -   PHQ-9 Score _1 -   Difficult doing work/chores Somewhat difficult Not difficult at all Not difficult at all Not difficult at all -     Interpretation of Total Score  Total Score Depression Severity:  1-4 = Minimal depression, 5-9 = Mild depression, 10-14 = Moderate depression, 15-19 = Moderately severe depression, 20-27 = Severe depression   Psychosocial Evaluation and Intervention: Psychosocial Evaluation - 03/28/17 1646      Psychosocial Evaluation & Interventions   Comments  Counselor met with Mr. Henrichs Marcott) today for psychosocial evaluation for Cardiac Rehab.  He was in our Pulmonary Rehab program several years ago.  He continues to have a strong suport system with a spouse of 73 years and (2) daughters who live locally.  Lonnel has diabetes and sleep apnea, as well as COPD and heart disease.  He reports sleeping well with the use of a CPAP.  He has a good appetite and has goals to lose some weight while in this program.  Aldean reports a history of some anxiety in the past and has been on medication for this for approximately 10 years.  He states this is  working well for him and treats his current symptoms.  He states his stress is minimal other than his chronic health conditions.  He has goals to increase his stamina and strength as well as lose weight.  He has gym equipment to use at home once he completes this program.  Marquan will be followed by staff while here.      Expected Outcomes  Stillman will benefit from consistent exercise to achieve his states goals.  The educational and  psychoeducational components of this program will be helpful in understanding his medical condition and coping more positively.  Alfonza will meet with the dietician to address his weight loss goals.      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  Byran said fishing is his stress release especially fishing at the [employer].     Expected Outcomes  Keep stress low by [place]    Interventions  Encouraged to attend Cardiac Rehabilitation for the exercise       Vocational Rehabilitation: Provide vocational rehab assistance to qualifying candidates.   Vocational Rehab Evaluation & Intervention: Vocational Rehab - 03/14/17 1435      Initial Vocational Rehab Evaluation & Intervention   Assessment shows need for Vocational Rehabilitation  No       Education: Education Goals: Education classes will be provided on a variety of topics geared toward better understanding of heart health and risk factor modification. Participant will state understanding/return demonstration of topics presented as noted by education test scores.  Learning Barriers/Preferences: Learning Barriers/Preferences - 03/14/17 1434      Learning Barriers/Preferences   Learning Barriers  Hearing;Exercise Concerns    Learning Preferences  None       Education Topics:  AED/CPR: -  Group verbal and written instruction with the use of models to demonstrate the basic use of the AED with the basic ABC's of resuscitation.   Cardiac Rehab from 05/16/2017 in [facility] Cardiac and Pulmonary Rehab  Date  04/04/17  Educator  MA  Instruction Review Code  1- Verbalizes Understanding      General Nutrition Guidelines/Fats and Fiber: -Group instruction provided by verbal, written material, models and posters to present the general guidelines for heart healthy nutrition. Gives an explanation and review of dietary fats and fiber.   Cardiac Rehab from 05/16/2017 in [facility] Cardiac and Pulmonary Rehab  Date  05/16/17  Educator  PI  Instruction Review Code  1- Verbalizes Understanding      Controlling Sodium/Reading Food Labels: -Group verbal and written material supporting the discussion of sodium use in heart healthy nutrition. Review and explanation with models, verbal and written materials for utilization of the food label.   Cardiac Rehab from 05/16/2017 in  [place] Cardiac and Pulmonary Rehab  Date  03/28/17  Educator  PI  Instruction Review Code  1- Verbalizes Understanding      Exercise Physiology & General Exercise Guidelines: - Group verbal and written instruction with models to review the exercise physiology of the cardiovascular system and associated critical values. Provides general exercise guidelines with specific guidelines to those with heart or lung disease.    Cardiac Rehab from 05/16/2017 in [facility] Cardiac and Pulmonary Rehab  Date  04/11/17  Educator  [facility]  Instruction Review Code  1- Verbalizes Understanding      Aerobic Exercise & Resistance Training: - Gives group verbal and written instruction on the various components of exercise. Focuses on aerobic and resistive training programs and the benefits of this training and how to safely progress through these programs..   Cardiac Rehab from 05/16/2017 in [facility] Cardiac and Pulmonary Rehab  Date  04/18/17  Educator  AS   Instruction Review Code  1- Verbalizes Understanding      Flexibility, Balance, Mind/Body Relaxation: Provides group verbal/written instruction on the benefits of flexibility and balance training, including mind/body exercise modes such as yoga, pilates and tai chi.  Demonstration and skill practice provided.   Cardiac Rehab from 05/16/2017 in [facility] Cardiac and Pulmonary Rehab  Date  04/20/17  Educator  AS  Instruction Review Code  1- Verbalizes Understanding      Stress and Anxiety: - Provides group verbal and written instruction about the health risks of elevated stress and causes of high stress.  Discuss the correlation between heart/lung disease and anxiety and treatment options. Review healthy ways to manage with stress and anxiety.   Cardiac Rehab from 05/16/2017 in [facility] Cardiac and Pulmonary Rehab  Date  04/27/17  Educator  [facility]  Instruction Review Code  1- Verbalizes Understanding      Depression: - Provides group verbal and written instruction on the correlation between heart/lung disease and depressed mood, treatment options, and the stigmas associated with seeking treatment.   Cardiac Rehab from 05/16/2017 in [facility] Cardiac and Pulmonary Rehab  Date  04/13/17  Educator  [facility]  Instruction Review Code  1- Verbalizes Understanding      Anatomy & Physiology of the Heart: - Group verbal and written instruction and models provide basic cardiac anatomy and physiology, with the coronary electrical and arterial systems. Review of Valvular disease and Heart Failure   Cardiac Rehab from 05/16/2017 in [facility] Cardiac and Pulmonary Rehab  Date  05/09/17  Educator  CE  Instruction Review Code  1- Verbalizes Understanding      Cardiac Procedures: - Group verbal and written instruction to review commonly prescribed medications for heart disease. Reviews the medication, class of the drug, and side effects. Includes the steps to properly store meds and maintain the prescription regimen. (beta  blockers and nitrates)   Cardiac Medications I: - Group verbal and written instruction to review commonly prescribed medications for heart disease. Reviews the medication, class of the drug, and side effects. Includes the steps to properly store meds and maintain the prescription regimen.   Cardiac Rehab from 05/16/2017 in [facility] Cardiac and Pulmonary Rehab  Date  05/02/17  Educator  CE  Instruction Review Code  1- Verbalizes Understanding      Cardiac Medications II: -Group verbal and written instruction to review commonly prescribed medications for heart disease. Reviews the medication, class of the drug, and side effects. (all other drug classes)   Cardiac Rehab from 05/16/2017 in [facility] Cardiac and Pulmonary  Rehab  Date  04/25/17  Educator  CE  Instruction Review Code  1- Verbalizes Understanding       Go Sex-Intimacy & Heart Disease, Get SMART - Goal Setting: - Group verbal and written instruction through game format to discuss heart disease and the return to sexual intimacy. Provides group verbal and written material to discuss and apply goal setting through the application of the S.M.A.R.T. Method.   Other Matters of the Heart: - Provides group verbal, written materials and models to describe Stable Angina and Peripheral Artery. Includes description of the disease process and treatment options available to the cardiac patient.   Exercise & Equipment Safety: - Individual verbal instruction and demonstration of equipment use and safety with use of the equipment.   Cardiac Rehab from 05/16/2017 in [facility] Cardiac and Pulmonary Rehab  Date  03/14/17  Educator  [facility]  Instruction Review Code  1- Verbalizes Understanding      Infection Prevention: - Provides verbal and written material to individual with discussion of infection control including proper hand washing and proper equipment cleaning during exercise session.   Cardiac Rehab from 05/16/2017 in [facility] Cardiac and Pulmonary Rehab   Date  03/14/17  Educator  [facility]  Instruction Review Code  1- Verbalizes Understanding      Falls Prevention: - Provides verbal and written material to individual with discussion of falls prevention and safety.   Cardiac Rehab from 05/16/2017 in [facility] Cardiac and Pulmonary Rehab  Date  03/14/17  Educator  [facility]  Instruction Review Code  1- Verbalizes Understanding      Diabetes: - Individual verbal and written instruction to review signs/symptoms of diabetes, desired ranges of glucose level fasting, after meals and with exercise. Acknowledge that pre and post exercise glucose checks will be done for 3 sessions at entry of program.   Cardiac Rehab from 05/16/2017 in [facility] Cardiac and Pulmonary Rehab  Date  03/14/17 [05/11/2016 Sleep lecture done by Lucianne Lei, MSW]  Educator  [facility]  Instruction Review Code  1- Verbalizes Understanding      Know Your Numbers and Risk Factors: -Group verbal and written instruction about important numbers in your health.  Discussion of what are risk factors and how they play a role in the disease process.  Review of Cholesterol, Blood Pressure, Diabetes, and BMI and the role they play in your overall health.   Cardiac Rehab from 05/16/2017 in [facility] Cardiac and Pulmonary Rehab  Date  04/25/17  Educator  CE  Instruction Review Code  1- Verbalizes Understanding      Sleep Hygiene: -Provides group verbal and written instruction about how sleep can affect your health.  Define sleep hygiene, discuss sleep cycles and impact of sleep habits. Review good sleep hygiene tips.    Cardiac Rehab from 05/16/2017 in [facility] Cardiac and Pulmonary Rehab  Date  05/11/17  Educator  [facility]  Instruction Review Code  1- Verbalizes Understanding      Other: -Provides group and verbal instruction on various topics (see comments)   Knowledge Questionnaire Score: Knowledge Questionnaire Score - 05/16/17 1629      Knowledge Questionnaire Score   Post Score  27/28       Core  Components/Risk Factors/Patient Goals at Admission: Personal Goals and Risk Factors at Admission - 03/14/17 1423      Core Components/Risk Factors/Patient Goals on Admission    Weight Management  Yes;Obesity;Weight Loss    Intervention  Weight Management: Develop a combined nutrition and exercise program designed to reach  desired caloric intake, while maintaining appropriate intake of nutrient and fiber, sodium and fats, and appropriate energy expenditure required for the weight goal.;Weight Management: Provide education and appropriate resources to help participant work on and attain dietary goals.;Weight Management/Obesity: Establish reasonable short term and long term weight goals.;Obesity: Provide education and appropriate resources to help participant work on and attain dietary goals.    Admit Weight  254 lb 14.4 oz (115.6 kg)    Goal Weight: Short Term  250 lb (113.4 kg)    Goal Weight: Long Term  210 lb (95.3 kg)    Expected Outcomes  Short Term: Continue to assess and modify interventions until short term weight is achieved;Long Term: Adherence to nutrition and physical activity/exercise program aimed toward attainment of established weight goal;Weight Loss: Understanding of general recommendations for a balanced deficit meal plan, which promotes 1-2 lb weight loss per week and includes a negative energy balance of [phone] kcal/d;Understanding of distribution of calorie intake throughout the day with the consumption of 4-5 meals/snacks;Understanding recommendations for meals to include 15-35% energy as protein, 25-35% energy from fat, 35-60% energy from carbohydrates, less than 259m of dietary cholesterol, 20-35 gm of total fiber daily    Tobacco Cessation  Yes    Number of packs per day  1 cig each morning    Intervention  Assist the participant in steps to quit. Provide individualized education and counseling about committing to Tobacco Cessation, relapse prevention, and pharmacological  support that can be provided by physician.;OAdvice worker assist with locating and accessing local/national Quit Smoking programs, and support quit date choice.    Expected Outcomes  Short Term: Will demonstrate readiness to quit, by selecting a quit date.;Long Term: Complete abstinence from all tobacco products for at least 12 months from quit date.;Short Term: Will quit all tobacco product use, adhering to prevention of relapse plan.    Diabetes  Yes    Intervention  Provide education about signs/symptoms and action to take for hypo/hyperglycemia.;Provide education about proper nutrition, including hydration, and aerobic/resistive exercise prescription along with prescribed medications to achieve blood glucose in normal ranges: Fasting glucose 65-99 mg/dL    Expected Outcomes  Short Term: Participant verbalizes understanding of the signs/symptoms and immediate care of hyper/hypoglycemia, proper foot care and importance of medication, aerobic/resistive exercise and nutrition plan for blood glucose control.;Long Term: Attainment of HbA1C < 7%.    Hypertension  Yes    Intervention  Provide education on lifestyle modifcations including regular physical activity/exercise, weight management, moderate sodium restriction and increased consumption of fresh fruit, vegetables, and low fat dairy, alcohol moderation, and smoking cessation.;Monitor prescription use compliance.    Expected Outcomes  Short Term: Continued assessment and intervention until BP is < 140/98mHG in hypertensive participants. < 130/8028mG in hypertensive participants with diabetes, heart failure or chronic kidney disease.;Long Term: Maintenance of blood pressure at goal levels.    Lipids  Yes    Intervention  Provide education and support for participant on nutrition & aerobic/resistive exercise along with prescribed medications to achieve LDL <46m62mDL >40mg28m Expected Outcomes  Short Term: Participant states  understanding of desired cholesterol values and is compliant with medications prescribed. Participant is following exercise prescription and nutrition guidelines.;Long Term: Cholesterol controlled with medications as prescribed, with individualized exercise RX and with personalized nutrition plan. Value goals: LDL < 46mg,63m > 40 mg.       Core Components/Risk Factors/Patient Goals Review:  Goals and Risk Factor Review  [place] Name 04/18/17 1638 05/11/17 1621 05/11/17 1622         Core Components/Risk Factors/Patient Goals Review   Personal Goals Review  Weight Management/Obesity;Lipids;Hypertension;Diabetes;Improve shortness of breath with ADL's  -  Weight Management/Obesity;Hypertension;Diabetes     Review  Jasher has not lost weight yet but we discussed healthy weight loss priniples and the difference between fat weight and muscle weight. He is taking all cardiac meds as prescribed to manage his diabetes, lipids, and blood pressure. He is attending cardiac rehab on a regular basis and meets with a dietician from the [place] 2X/year.   WEight is 250  weight is 256lbs today not 250lbs . Blood sugars before meals Hiroshi reports that they are 125-130. His blood pressure today was 150/64 but that was when he first arrived to Cardiac REhab.      Expected Outcomes  Short: Weight goal 250 lbs Long: 210 lbs.   -  STill short term goal of 250lbs. Long term even 240lbs at this point.         Core Components/Risk Factors/Patient Goals at Discharge (Final Review):  Goals and Risk Factor Review - 05/11/17 1622      Core Components/Risk Factors/Patient Goals Review   Personal Goals Review  Weight Management/Obesity;Hypertension;Diabetes    Review  weight is 256lbs today not 250lbs . Blood sugars before meals Wilbur reports that they are 125-130. His blood pressure today was 150/64 but that was when he first arrived to Cardiac REhab.     Expected Outcomes  STill short term goal of 250lbs. Long term even 240lbs at  this point.        ITP Comments: ITP Comments    Row Name 03/14/17 1400 03/23/17 0645 03/31/17 1409 04/20/17 0611 05/18/17 0654   ITP Comments  Med review completed. Initial ITP created. Diagnosis can be found in [facility] from [place] 12/03/16  30 Day review. Continue with ITP unless directed changes per Medical Director review.   New to program  Terrence called to say he is sorry that he can not attend Cardiac Rehab today but he didn't sleep well due to coughing and sneezing. I suggested that he call his MD also.   30 day review. Continue with ITP unless directed changes per Medical Director review.  30 Day review. Continue with ITP unless directed changes per Medical Director review.        Comments:

## 2017-05-18 NOTE — Progress Notes (Signed)
Daily Session Note  Patient Details  Name: Jeffrey Burgess MRN: [id number] Date of Birth: 01-22-1946 Referring Provider:     Cardiac Rehab from 03/14/2017 in [facility] Cardiac and Pulmonary Rehab  Referring Provider  [name] MD  [name]      Encounter Date: 05/18/2017  Check In: Session Check In - 05/18/17 1745      Check-In   Location  [facility]-Cardiac & Pulmonary Rehab    Staff Present  Renita Papa, RN Vickki Hearing, BA, ACSM CEP, Exercise Physiologist;Carroll Enterkin, RN, BSN    Supervising physician immediately available to respond to emergencies  See telemetry face sheet for immediately available ER MD    Medication changes reported      No    Fall or balance concerns reported     No    Warm-up and Cool-down  Performed on first and last piece of equipment    Resistance Training Performed  Yes    VAD Patient?  No      Pain Assessment   Currently in Pain?  No/denies    Multiple Pain Sites  No          Social History   Tobacco Use  Smoking Status Current Every Day Smoker  . Packs/day: 0.25  . Years: 57.00  . Pack years: 14.25  . Types: Cigarettes  Smokeless Tobacco Former [place]   reports smoking 1 cig each morning.    Goals Met:  Independence with exercise equipment Exercise tolerated well No report of cardiac concerns or symptoms Strength training completed today  Goals Unmet:  Not Applicable  Comments: Pt able to follow exercise prescription today without complaint.  Will continue to monitor for progression.    Dr. Emily Filbert is Medical Director for [place] and [name].

## 2017-05-19 VITALS — Ht 68.9 in | Wt 254.0 lb

## 2017-05-19 DIAGNOSIS — I25118 Atherosclerotic heart disease of native coronary artery with other forms of angina pectoris: Secondary | ICD-10-CM | POA: Diagnosis not present

## 2017-05-19 DIAGNOSIS — I2089 Other forms of angina pectoris: Secondary | ICD-10-CM

## 2017-05-19 DIAGNOSIS — I208 Other forms of angina pectoris: Secondary | ICD-10-CM

## 2017-05-19 NOTE — Progress Notes (Signed)
Daily Session Note  Patient Details  Name: Jeffrey Burgess MRN: [id number] Date of Birth: 17-Jan-1946 Referring Provider:     Cardiac Rehab from 03/14/2017 in [facility] Cardiac and Pulmonary Rehab  Referring Provider  [name] MD  [name]      Encounter Date: 05/19/2017  Check In: Session Check In - 05/19/17 1632      Check-In   Location  [facility]-Cardiac & Pulmonary Rehab    Staff Present  Justin Mend RCP,RRT,BSRT;Laureen Owens Shark, BS, RRT, Respiratory Therapist;Meredith Sherryll Burger, RN BSN    Supervising physician immediately available to respond to emergencies  See telemetry face sheet for immediately available ER MD    Medication changes reported      No    Fall or balance concerns reported     No    Tobacco Cessation  No Change    Warm-up and Cool-down  Performed on first and last piece of equipment    Resistance Training Performed  Yes    VAD Patient?  No      Pain Assessment   Currently in Pain?  No/denies          Social History   Tobacco Use  Smoking Status Current Every Day Smoker  . Packs/day: 0.25  . Years: 57.00  . Pack years: 14.25  . Types: Cigarettes  Smokeless Tobacco Former [place]   reports smoking 1 cig each morning.    Goals Met:  Independence with exercise equipment Exercise tolerated well No report of cardiac concerns or symptoms Strength training completed today  Goals Unmet:  Not Applicable  Comments:  [place] Name 03/14/17 1427 05/19/17 1637       6 Minute Walk   Phase  Initial  Discharge    Distance  902 feet  1170 feet    Distance % Change  -  29 %    Distance Feet Change  -  268 ft    Walk Time  6 minutes  6 minutes    # of Rest Breaks  0  0    MPH  1.71  2.2    METS  2.05  2.83    RPE  13  12    Perceived Dyspnea   2  0    VO2 Peak  7.17  9.9    Symptoms  Yes (comment)  No    Comments  SOB  -    Resting HR  67 bpm  77 bpm    Resting BP  126/64  148/82    Resting Oxygen Saturation   98 %  -    Exercise Oxygen Saturation  during 6 min walk  95 %  95 %    Max Ex. HR  104 bpm  105 bpm    Max Ex. BP  176/84  214/84    2 Minute Post BP  132/76  -     Pt able to follow exercise prescription today without complaint.  Will continue to monitor for progression.   Dr. Emily Filbert is Medical Director for [place] and [name].

## 2017-05-23 ENCOUNTER — Encounter: Payer: Non-veteran care | Attending: General Practice | Admitting: *Deleted

## 2017-05-23 DIAGNOSIS — I10 Essential (primary) hypertension: Secondary | ICD-10-CM | POA: Insufficient documentation

## 2017-05-23 DIAGNOSIS — E119 Type 2 diabetes mellitus without complications: Secondary | ICD-10-CM | POA: Insufficient documentation

## 2017-05-23 DIAGNOSIS — Z7951 Long term (current) use of inhaled steroids: Secondary | ICD-10-CM | POA: Insufficient documentation

## 2017-05-23 DIAGNOSIS — M069 Rheumatoid arthritis, unspecified: Secondary | ICD-10-CM | POA: Diagnosis not present

## 2017-05-23 DIAGNOSIS — Z79899 Other long term (current) drug therapy: Secondary | ICD-10-CM | POA: Insufficient documentation

## 2017-05-23 DIAGNOSIS — F17210 Nicotine dependence, cigarettes, uncomplicated: Secondary | ICD-10-CM | POA: Diagnosis not present

## 2017-05-23 DIAGNOSIS — G4730 Sleep apnea, unspecified: Secondary | ICD-10-CM | POA: Diagnosis not present

## 2017-05-23 DIAGNOSIS — I252 Old myocardial infarction: Secondary | ICD-10-CM | POA: Insufficient documentation

## 2017-05-23 DIAGNOSIS — Z7982 Long term (current) use of aspirin: Secondary | ICD-10-CM | POA: Diagnosis not present

## 2017-05-23 DIAGNOSIS — I208 Other forms of angina pectoris: Secondary | ICD-10-CM | POA: Insufficient documentation

## 2017-05-23 DIAGNOSIS — J45909 Unspecified asthma, uncomplicated: Secondary | ICD-10-CM | POA: Diagnosis not present

## 2017-05-23 DIAGNOSIS — I2089 Other forms of angina pectoris: Secondary | ICD-10-CM

## 2017-05-23 DIAGNOSIS — E785 Hyperlipidemia, unspecified: Secondary | ICD-10-CM | POA: Diagnosis not present

## 2017-05-23 DIAGNOSIS — Z794 Long term (current) use of insulin: Secondary | ICD-10-CM | POA: Insufficient documentation

## 2017-05-23 NOTE — Progress Notes (Signed)
Daily Session Note  Patient Details  Name: BUD KAESER MRN: [id number] Date of Birth: [birth year] Referring Provider:     Cardiac Rehab from 03/14/2017 in [facility] Cardiac and Pulmonary Rehab  Referring Provider  [name] MD  [name]      Encounter Date: 05/23/2017  Check In: Session Check In - 05/23/17 1632      Check-In   Location  [facility]-Cardiac & Pulmonary Rehab    Staff Present  Earlean Shawl, BS, ACSM CEP, Exercise Physiologist;Amanda Oletta Darter, BA, ACSM CEP, Exercise Physiologist;Carroll Enterkin, RN, BSN    Supervising physician immediately available to respond to emergencies  See telemetry face sheet for immediately available ER MD    Medication changes reported      No    Fall or balance concerns reported     No    Tobacco Cessation  No Change    Warm-up and Cool-down  Performed on first and last piece of equipment    Resistance Training Performed  Yes    VAD Patient?  No      Pain Assessment   Currently in Pain?  No/denies    Multiple Pain Sites  No          Social History   Tobacco Use  Smoking Status Current Every Day Smoker  . Packs/day: 0.25  . Years: 57.00  . Pack years: 14.25  . Types: Cigarettes  Smokeless Tobacco Former [place]   reports smoking 1 cig each morning.    Goals Met:  Independence with exercise equipment Exercise tolerated well No report of cardiac concerns or symptoms Strength training completed today  Goals Unmet:  Not Applicable  Comments: Pt able to follow exercise prescription today without complaint.  Will continue to monitor for progression.  Zachariah graduated today from  rehab with 36 sessions completed.  Details of the patient's exercise prescription and what He needs to do in order to continue the prescription and progress were discussed with patient.  Patient was given a copy of prescription and goals.  Patient verbalized understanding.  Sonya plans to continue to exercise by using his bike and treadmill at  home.    Dr. Emily Filbert is Medical Director for [place] and [name].

## 2017-05-23 NOTE — Progress Notes (Signed)
Discharge Progress Report  Patient Details  Name: Jeffrey Burgess MRN: [id number] Date of Birth: 1945-06-17 Referring Provider:     Cardiac Rehab from 03/14/2017 in [facility]  Referring Provider  [name] MD  [name]       Number of Visits: 36  Reason for Discharge:  Patient reached a stable level of exercise. Patient independent in their exercise. Patient has met program and personal goals.  Smoking History:  Social History   Tobacco Use  Smoking Status Current Every Day Smoker  . Packs/day: 0.25  . Years: 57.00  . Pack years: 14.25  . Types: Cigarettes  Smokeless Tobacco Former [place]   reports smoking 1 cig each morning.    Diagnosis:  Stable angina ([place])  ADL [facility]:   Initial Exercise Prescription: Initial Exercise Prescription - 03/14/17 1400      Date of Initial Exercise RX and Referring Provider   Date  03/14/17    Referring Provider  [name] MD  [facility]      Treadmill   MPH  1.4    Grade  0.5    Minutes  15    METs  2.17      Arm Ergometer   Level  1    Watts  38    RPM  25    Minutes  15    METs  2      T5 Nustep   Level  1    SPM  80    Minutes  15    METs  2      Prescription Details   Frequency (times per week)  3    Duration  Progress to 45 minutes of aerobic exercise without signs/symptoms of physical distress      Intensity   THRR 40-80% of Max Heartrate  101-134    Ratings of Perceived Exertion  11-13    Perceived Dyspnea  0-4      Progression   Progression  Continue to progress workloads to maintain intensity without signs/symptoms of physical distress.      Resistance Training   Training Prescription  Yes    Weight  3 lbs    Reps  10-15       Discharge Exercise Prescription (Final Exercise Prescription Changes): Exercise Prescription Changes - 05/11/17 1500      Response to Exercise   Blood Pressure (Admit)  138/70    Blood Pressure (Exercise)  194/78     Blood Pressure (Exit)  124/64    Heart Rate (Admit)  92 bpm    Heart Rate (Exercise)  117 bpm    Heart Rate (Exit)  93 bpm    Rating of Perceived Exertion (Exercise)  13    Symptoms  none    Intensity  THRR unchanged      Progression   Progression  Continue to progress workloads to maintain intensity without signs/symptoms of physical distress.    Average METs  2.8      Resistance Training   Training Prescription  Yes    Weight  3 lb    Reps  10-15      Treadmill   MPH  2    Grade  1    Minutes  15    METs  2.8      NuStep   Level  7    SPM  80    Minutes  15       Functional  Capacity: 6 Minute Walk    Row Name 03/14/17 1427 05/19/17 1637       6 Minute Walk   Phase  Initial  Discharge    Distance  902 feet  1170 feet    Distance % Change  -  29 %    Distance Feet Change  -  268 ft    Walk Time  6 minutes  6 minutes    # of Rest Breaks  0  0    MPH  1.71  2.2    METS  2.05  2.83    RPE  13  12    Perceived Dyspnea   2  0    VO2 Peak  7.17  9.9    Symptoms  Yes (comment)  No    Comments  SOB  -    Resting HR  67 bpm  77 bpm    Resting BP  126/64  148/82    Resting Oxygen Saturation   98 %  -    Exercise Oxygen Saturation  during 6 min walk  95 %  95 %    Max Ex. HR  104 bpm  105 bpm    Max Ex. BP  176/84  214/84    2 Minute Post BP  132/76  -       Psychological, QOL, Others - Outcomes: PHQ 2/9: Depression screen PHQ 2/9 05/16/2017 03/14/2017 07/04/2015 06/27/2015 02/18/2015  Decreased Interest 0 0 0 0 0  Down, Depressed, Hopeless 0 0 0 0 0  PHQ - 2 Score 0 0 0 0 0  Altered sleeping 1 1 0 0 -  Tired, decreased energy 1 2 1 1 -  Change in appetite 0 0 0 0 -  Feeling bad or failure about yourself  0 0 1 1 -  Trouble concentrating 0 0 0 0 -  Moving slowly or fidgety/restless 0 0 0 0 -  Suicidal thoughts 0 0 0 0 -  PHQ-9 Score 2 3 2 2 -  Difficult doing work/chores Somewhat difficult Not difficult at all Not difficult at all Not difficult at all -     Quality of Life: Quality of Life - 05/16/17 1629      Quality of Life Scores   Health/Function Post  24.6 %    Socioeconomic Post  29.06 %    Psych/Spiritual Post  28.93 %    Family Post  30 %    GLOBAL Post  27.26 %       Personal Goals: Goals established at orientation with interventions provided to work toward goal. Personal Goals and Risk Factors at Admission - 03/14/17 1423      Core Components/Risk Factors/Patient Goals on Admission    Weight Management  Yes;Obesity;Weight Loss    Intervention  Weight Management: Develop a combined nutrition and exercise program designed to reach desired caloric intake, while maintaining appropriate intake of nutrient and fiber, sodium and fats, and appropriate energy expenditure required for the weight goal.;Weight Management: Provide education and appropriate resources to help participant work on and attain dietary goals.;Weight Management/Obesity: Establish reasonable short term and long term weight goals.;Obesity: Provide education and appropriate resources to help participant work on and attain dietary goals.    Admit Weight  254 lb 14.4 oz (115.6 kg)    Goal Weight: Short Term  250 lb (113.4 kg)    Goal Weight: Long Term  210 lb (95.3 kg)    Expected Outcomes  Short Term: Continue to   assess and modify interventions until short term weight is achieved;Long Term: Adherence to nutrition and physical activity/exercise program aimed toward attainment of established weight goal;Weight Loss: Understanding of general recommendations for a balanced deficit meal plan, which promotes 1-2 lb weight loss per week and includes a negative energy balance of [phone] kcal/d;Understanding of distribution of calorie intake throughout the day with the consumption of 4-5 meals/snacks;Understanding recommendations for meals to include 15-35% energy as protein, 25-35% energy from fat, 35-60% energy from carbohydrates, less than 243m of dietary cholesterol, 20-35 gm  of total fiber daily    Tobacco Cessation  Yes    Number of packs per day  1 cig each morning    Intervention  Assist the participant in steps to quit. Provide individualized education and counseling about committing to Tobacco Cessation, relapse prevention, and pharmacological support that can be provided by physician.;OAdvice worker assist with locating and accessing local/national Quit Smoking programs, and support quit date choice.    Expected Outcomes  Short Term: Will demonstrate readiness to quit, by selecting a quit date.;Long Term: Complete abstinence from all tobacco products for at least 12 months from quit date.;Short Term: Will quit all tobacco product use, adhering to prevention of relapse plan.    Diabetes  Yes    Intervention  Provide education about signs/symptoms and action to take for hypo/hyperglycemia.;Provide education about proper nutrition, including hydration, and aerobic/resistive exercise prescription along with prescribed medications to achieve blood glucose in normal ranges: Fasting glucose 65-99 mg/dL    Expected Outcomes  Short Term: Participant verbalizes understanding of the signs/symptoms and immediate care of hyper/hypoglycemia, proper foot care and importance of medication, aerobic/resistive exercise and nutrition plan for blood glucose control.;Long Term: Attainment of HbA1C < 7%.    Hypertension  Yes    Intervention  Provide education on lifestyle modifcations including regular physical activity/exercise, weight management, moderate sodium restriction and increased consumption of fresh fruit, vegetables, and low fat dairy, alcohol moderation, and smoking cessation.;Monitor prescription use compliance.    Expected Outcomes  Short Term: Continued assessment and intervention until BP is < 140/959mHG in hypertensive participants. < 130/8066mG in hypertensive participants with diabetes, heart failure or chronic kidney disease.;Long Term: Maintenance of  blood pressure at goal levels.    Lipids  Yes    Intervention  Provide education and support for participant on nutrition & aerobic/resistive exercise along with prescribed medications to achieve LDL <42m89mDL >40mg42m Expected Outcomes  Short Term: Participant states understanding of desired cholesterol values and is compliant with medications prescribed. Participant is following exercise prescription and nutrition guidelines.;Long Term: Cholesterol controlled with medications as prescribed, with individualized exercise RX and with personalized nutrition plan. Value goals: LDL < 42mg,29m > 40 mg.        Personal Goals Discharge: Goals and Risk Factor Review    Row Name 04/18/17 1638 05/11/17 1621 05/11/17 1622         Core Components/Risk Factors/Patient Goals Review   Personal Goals Review  Weight Management/Obesity;Lipids;Hypertension;Diabetes;Improve shortness of breath with ADL's  -  Weight Management/Obesity;Hypertension;Diabetes     Review  Jeffrey Burgess lost weight yet but we discussed healthy weight loss priniples and the difference between fat weight and muscle weight. He is taking all cardiac meds as prescribed to manage his diabetes, lipids, and blood pressure. He is attending cardiac rehab on a regular basis and [facility]   WEight is 250  weight is  256lbs today not 250lbs . Blood sugars before meals [place] reports that they are 125-130. His blood pressure today was 150/64 but that was when he first arrived to Cardiac REhab.      Expected Outcomes  Short: Weight goal 250 lbs Long: 210 lbs.   -  STill short term goal of 250lbs. Long term even 240lbs at this point.         Exercise Goals and Review: Exercise Goals    Row Name 03/14/17 1443             Exercise Goals   Increase Physical Activity  Yes       Intervention  Provide advice, education, support and counseling about physical activity/exercise needs.;Develop an individualized exercise  prescription for aerobic and resistive training based on initial evaluation findings, risk stratification, comorbidities and participant's personal goals.       Expected Outcomes  Short Term: Attend rehab on a regular basis to increase amount of physical activity.;Long Term: Add in home exercise to make exercise part of routine and to increase amount of physical activity.;Long Term: Exercising regularly at least 3-5 days a week.       Increase Strength and Stamina  Yes       Intervention  Provide advice, education, support and counseling about physical activity/exercise needs.;Develop an individualized exercise prescription for aerobic and resistive training based on initial evaluation findings, risk stratification, comorbidities and participant's personal goals.       Expected Outcomes  Short Term: Increase workloads from initial exercise prescription for resistance, speed, and METs.;Short Term: Perform resistance training exercises routinely during rehab and add in resistance training at home;Long Term: Improve cardiorespiratory fitness, muscular endurance and strength as measured by increased METs and functional capacity (6MWT)       Able to understand and use rate of perceived exertion (RPE) scale  Yes       Intervention  Provide education and explanation on how to use RPE scale       Expected Outcomes  Short Term: Able to use RPE daily in rehab to express subjective intensity level;Long Term:  Able to use RPE to guide intensity level when exercising independently       Able to understand and use Dyspnea scale  Yes       Intervention  Provide education and explanation on how to use Dyspnea scale       Expected Outcomes  Short Term: Able to use Dyspnea scale daily in rehab to express subjective sense of shortness of breath during exertion;Long Term: Able to use Dyspnea scale to guide intensity level when exercising independently       Knowledge and understanding of Target Heart Rate Range (THRR)  Yes        Intervention  Provide education and explanation of THRR including how the numbers were predicted and where they are located for reference       Expected Outcomes  Short Term: Able to state/look up THRR;Long Term: Able to use THRR to govern intensity when exercising independently;Short Term: Able to use daily as guideline for intensity in rehab       Able to check pulse independently  Yes       Intervention  Provide education and demonstration on how to check pulse in carotid and radial arteries.;Review the importance of being able to check your own pulse for safety during independent exercise       Expected Outcomes  Short Term: Able to explain why pulse checking is   important during independent exercise;Long Term: Able to check pulse independently and accurately       Understanding of Exercise Prescription  Yes       Intervention  Provide education, explanation, and written materials on patient's individual exercise prescription       Expected Outcomes  Short Term: Able to explain program exercise prescription;Long Term: Able to explain home exercise prescription to exercise independently          Nutrition & Weight - Outcomes: Pre Biometrics - 03/14/17 1444      Pre Biometrics   Height  5' 8.9" (1.75 m)    Weight  254 lb 14.4 oz (115.6 kg)    Waist Circumference  48 inches    Hip Circumference  48 inches    Waist to Hip Ratio  1 %    BMI (Calculated)  37.75    Single Leg Stand  3.34 seconds      Post Biometrics - 05/19/17 1636       Post  Biometrics   Height  5' 8.9" (1.75 m)    Weight  254 lb (115.2 kg)    Waist Circumference  46 inches    Hip Circumference  48 inches    Waist to Hip Ratio  0.96 %    BMI (Calculated)  37.62    Single Leg Stand  10 seconds       Nutrition: Nutrition Therapy & Goals - 03/14/17 1432      Intervention Plan   Intervention  Prescribe, educate and counsel regarding individualized specific dietary modifications aiming towards targeted core  components such as weight, hypertension, lipid management, diabetes, heart failure and other comorbidities.;Nutrition handout(s) given to patient.    Expected Outcomes  Short Term Goal: Understand basic principles of dietary content, such as calories, fat, sodium, cholesterol and nutrients.;Short Term Goal: A plan has been developed with personal nutrition goals set during dietitian appointment.;Long Term Goal: Adherence to prescribed nutrition plan.       Nutrition Discharge: Nutrition Assessments - 05/16/17 1630      MEDFICTS Scores   Post Score  38       Education Questionnaire Score: Knowledge Questionnaire Score - 05/16/17 1629      Knowledge Questionnaire Score   Post Score  27/28       Goals reviewed with patient; copy given to patient. 

## 2017-05-23 NOTE — Patient Instructions (Signed)
Discharge Patient Instructions  Patient Details  Name: Jeffrey Burgess MRN: [id number] Date of Birth: 05/10/1945 Referring Provider:  [place]   Number of Visits: 54  Reason for Discharge:  Patient reached a stable level of exercise. Patient independent in their exercise. Patient has met program and personal goals.  Smoking History:  Social History   Tobacco Use  Smoking Status Current Every Day Smoker  . Packs/day: 0.25  . Years: 57.00  . Pack years: 14.25  . Types: Cigarettes  Smokeless Tobacco Former [place]   reports smoking 1 cig each morning.    Diagnosis:  Stable angina [facility])  Initial Exercise Prescription: Initial Exercise Prescription - 03/14/17 1400      Date of Initial Exercise RX and Referring Provider   Date  03/14/17    Referring Provider  [name] MD  [facility]      Treadmill   MPH  1.4    Grade  0.5    Minutes  15    METs  2.17      Arm Ergometer   Level  1    Watts  38    RPM  25    Minutes  15    METs  2      T5 Nustep   Level  1    SPM  80    Minutes  15    METs  2      Prescription Details   Frequency (times per week)  3    Duration  Progress to 45 minutes of aerobic exercise without signs/symptoms of physical distress      Intensity   THRR 40-80% of Max Heartrate  101-134    Ratings of Perceived Exertion  11-13    Perceived Dyspnea  0-4      Progression   Progression  Continue to progress workloads to maintain intensity without signs/symptoms of physical distress.      Resistance Training   Training Prescription  Yes    Weight  3 lbs    Reps  10-15       Discharge Exercise Prescription (Final Exercise Prescription Changes): Exercise Prescription Changes - 05/11/17 1500      Response to Exercise   Blood Pressure (Admit)  138/70    Blood Pressure (Exercise)  194/78    Blood Pressure (Exit)  124/64    Heart Rate (Admit)  92 bpm    Heart Rate (Exercise)  117 bpm    Heart Rate (Exit)  93  bpm    Rating of Perceived Exertion (Exercise)  13    Symptoms  none    Intensity  THRR unchanged      Progression   Progression  Continue to progress workloads to maintain intensity without signs/symptoms of physical distress.    Average METs  2.8      Resistance Training   Training Prescription  Yes    Weight  3 lb    Reps  10-15      Treadmill   MPH  2    Grade  1    Minutes  15    METs  2.8      NuStep   Level  7    SPM  80    Minutes  15       Functional Capacity: 6 Minute Walk    Row Name 03/14/17 1427 05/19/17 1637       6 Minute Walk   Phase  Initial  Discharge    Distance  902 feet  1170 feet    Distance % Change  -  29 %    Distance Feet Change  -  268 ft    Walk Time  6 minutes  6 minutes    # of Rest Breaks  0  0    MPH  1.71  2.2    METS  2.05  2.83    RPE  13  12    Perceived Dyspnea   2  0    VO2 Peak  7.17  9.9    Symptoms  Yes (comment)  No    Comments  SOB  -    Resting HR  67 bpm  77 bpm    Resting BP  126/64  148/82    Resting Oxygen Saturation   98 %  -    Exercise Oxygen Saturation  during 6 min walk  95 %  95 %    Max Ex. HR  104 bpm  105 bpm    Max Ex. BP  176/84  214/84    2 Minute Post BP  132/76  -       Quality of Life: Quality of Life - 05/16/17 1629      Quality of Life Scores   Health/Function Post  24.6 %    Socioeconomic Post  29.06 %    Psych/Spiritual Post  28.93 %    Family Post  30 %    GLOBAL Post  27.26 %       Personal Goals: Goals established at orientation with interventions provided to work toward goal. Personal Goals and Risk Factors at Admission - 03/14/17 1423      Core Components/Risk Factors/Patient Goals on Admission    Weight Management  Yes;Obesity;Weight Loss    Intervention  Weight Management: Develop a combined nutrition and exercise program designed to reach desired caloric intake, while maintaining appropriate intake of nutrient and fiber, sodium and fats, and appropriate energy  expenditure required for the weight goal.;Weight Management: Provide education and appropriate resources to help participant work on and attain dietary goals.;Weight Management/Obesity: Establish reasonable short term and long term weight goals.;Obesity: Provide education and appropriate resources to help participant work on and attain dietary goals.    Admit Weight  254 lb 14.4 oz (115.6 kg)    Goal Weight: Short Term  250 lb (113.4 kg)    Goal Weight: Long Term  210 lb (95.3 kg)    Expected Outcomes  Short Term: Continue to assess and modify interventions until short term weight is achieved;Long Term: Adherence to nutrition and physical activity/exercise program aimed toward attainment of established weight goal;Weight Loss: Understanding of general recommendations for a balanced deficit meal plan, which promotes 1-2 lb weight loss per week and includes a negative energy balance of [phone] kcal/d;Understanding of distribution of calorie intake throughout the day with the consumption of 4-5 meals/snacks;Understanding recommendations for meals to include 15-35% energy as protein, 25-35% energy from fat, 35-60% energy from carbohydrates, less than 281m of dietary cholesterol, 20-35 gm of total fiber daily    Tobacco Cessation  Yes    Number of packs per day  1 cig each morning    Intervention  Assist the participant in steps to quit. Provide individualized education and counseling about committing to Tobacco Cessation, relapse prevention, and pharmacological support that can be provided by physician.;OAdvice worker assist with locating and accessing local/national Quit Smoking programs, and support quit date choice.  Expected Outcomes  Short Term: Will demonstrate readiness to quit, by selecting a quit date.;Long Term: Complete abstinence from all tobacco products for at least 12 months from quit date.;Short Term: Will quit all tobacco product use, adhering to prevention of relapse plan.     Diabetes  Yes    Intervention  Provide education about signs/symptoms and action to take for hypo/hyperglycemia.;Provide education about proper nutrition, including hydration, and aerobic/resistive exercise prescription along with prescribed medications to achieve blood glucose in normal ranges: Fasting glucose 65-99 mg/dL    Expected Outcomes  Short Term: Participant verbalizes understanding of the signs/symptoms and immediate care of hyper/hypoglycemia, proper foot care and importance of medication, aerobic/resistive exercise and nutrition plan for blood glucose control.;Long Term: Attainment of HbA1C < 7%.    Hypertension  Yes    Intervention  Provide education on lifestyle modifcations including regular physical activity/exercise, weight management, moderate sodium restriction and increased consumption of fresh fruit, vegetables, and low fat dairy, alcohol moderation, and smoking cessation.;Monitor prescription use compliance.    Expected Outcomes  Short Term: Continued assessment and intervention until BP is < 140/35m HG in hypertensive participants. < 130/88mHG in hypertensive participants with diabetes, heart failure or chronic kidney disease.;Long Term: Maintenance of blood pressure at goal levels.    Lipids  Yes    Intervention  Provide education and support for participant on nutrition & aerobic/resistive exercise along with prescribed medications to achieve LDL <7049mHDL >74m78m  Expected Outcomes  Short Term: Participant states understanding of desired cholesterol values and is compliant with medications prescribed. Participant is following exercise prescription and nutrition guidelines.;Long Term: Cholesterol controlled with medications as prescribed, with individualized exercise RX and with personalized nutrition plan. Value goals: LDL < 70mg87mL > 40 mg.        Personal Goals Discharge: Goals and Risk Factor Review - 05/11/17 1622      Core Components/Risk Factors/Patient Goals  Review   Personal Goals Review  Weight Management/Obesity;Hypertension;Diabetes    Review  weight is 256lbs today not 250lbs . Blood sugars before meals LearyMavricrts that they are 125-130. His blood pressure today was 150/64 but that was when he first arrived to [facility].     Expected Outcomes  STill short term goal of 250lbs. Long term even 240lbs at this point.        Exercise Goals and Review: Exercise Goals    Row Name 03/14/17 1443             Exercise Goals   Increase Physical Activity  Yes       Intervention  Provide advice, education, support and counseling about physical activity/exercise needs.;Develop an individualized exercise prescription for aerobic and resistive training based on initial evaluation findings, risk stratification, comorbidities and participant's personal goals.       Expected Outcomes  Short Term: Attend rehab on a regular basis to increase amount of physical activity.;Long Term: Add in home exercise to make exercise part of routine and to increase amount of physical activity.;Long Term: Exercising regularly at least 3-5 days a week.       Increase Strength and Stamina  Yes       Intervention  Provide advice, education, support and counseling about physical activity/exercise needs.;Develop an individualized exercise prescription for aerobic and resistive training based on initial evaluation findings, risk stratification, comorbidities and participant's personal goals.       Expected Outcomes  Short Term: Increase workloads from initial exercise prescription for resistance, speed,  and METs.;Short Term: Perform resistance training exercises routinely during rehab and add in resistance training at home;Long Term: Improve cardiorespiratory fitness, muscular endurance and strength as measured by increased METs and functional capacity (6MWT)       Able to understand and use rate of perceived exertion (RPE) scale  Yes       Intervention  Provide education and  explanation on how to use RPE scale       Expected Outcomes  Short Term: Able to use RPE daily in rehab to express subjective intensity level;Long Term:  Able to use RPE to guide intensity level when exercising independently       Able to understand and use Dyspnea scale  Yes       Intervention  Provide education and explanation on how to use Dyspnea scale       Expected Outcomes  Short Term: Able to use Dyspnea scale daily in rehab to express subjective sense of shortness of breath during exertion;Long Term: Able to use Dyspnea scale to guide intensity level when exercising independently       Knowledge and understanding of Target Heart Rate Range (THRR)  Yes       Intervention  Provide education and explanation of THRR including how the numbers were predicted and where they are located for reference       Expected Outcomes  Short Term: Able to state/look up THRR;Long Term: Able to use THRR to govern intensity when exercising independently;Short Term: Able to use daily as guideline for intensity in rehab       Able to check pulse independently  Yes       Intervention  Provide education and demonstration on how to check pulse in carotid and radial arteries.;Review the importance of being able to check your own pulse for safety during independent exercise       Expected Outcomes  Short Term: Able to explain why pulse checking is important during independent exercise;Long Term: Able to check pulse independently and accurately       Understanding of Exercise Prescription  Yes       Intervention  Provide education, explanation, and written materials on patient's individual exercise prescription       Expected Outcomes  Short Term: Able to explain program exercise prescription;Long Term: Able to explain home exercise prescription to exercise independently          Nutrition & Weight - Outcomes: Pre Biometrics - 03/14/17 1444      Pre Biometrics   Height  5' 8.9" (1.75 m)    Weight  254 lb 14.4 oz  (115.6 kg)    Waist Circumference  48 inches    Hip Circumference  48 inches    Waist to Hip Ratio  1 %    BMI (Calculated)  37.75    Single Leg Stand  3.34 seconds      Post Biometrics - 05/19/17 1636       Post  Biometrics   Height  5' 8.9" (1.75 m)    Weight  254 lb (115.2 kg)    Waist Circumference  46 inches    Hip Circumference  48 inches    Waist to Hip Ratio  0.96 %    BMI (Calculated)  37.62    Single Leg Stand  10 seconds       Nutrition: Nutrition Therapy & Goals - 03/14/17 1432      Intervention Plan   Intervention  Prescribe, educate and counsel regarding individualized  specific dietary modifications aiming towards targeted core components such as weight, hypertension, lipid management, diabetes, heart failure and other comorbidities.;Nutrition handout(s) given to patient.    Expected Outcomes  Short Term Goal: Understand basic principles of dietary content, such as calories, fat, sodium, cholesterol and nutrients.;Short Term Goal: A plan has been developed with personal nutrition goals set during dietitian appointment.;Long Term Goal: Adherence to prescribed nutrition plan.       Nutrition Discharge: Nutrition Assessments - 05/16/17 1630      MEDFICTS Scores   Post Score  38       Education Questionnaire Score: Knowledge Questionnaire Score - 05/16/17 1629      Knowledge Questionnaire Score   Post Score  27/28       Goals reviewed with patient; copy given to patient.

## 2017-05-23 NOTE — Progress Notes (Signed)
Cardiac Individual Treatment Plan  Patient Details  Name: Jeffrey Burgess MRN: [id number] Date of Birth: 1945-05-10 Referring Provider:     Cardiac Rehab from 03/14/2017 in [facility] Cardiac and Pulmonary Re[facility]  Referring Provider  [name] MD  [name]      Initial Encounter Date:    Cardiac Rehab from 03/14/2017 in [facility] Cardiac and Pulmonary Rehab  Date  03/14/17  Referring Provider  [name] MD  [name]      Visit Diagnosis: Stable angina ([place])  Patient's Home Medications on Admission:  Current Outpatient Medications:  .  acetaminophen (TYLENOL) 500 MG tablet, Take 500 mg by mouth., Disp: , Rfl:  .  albuterol (PROAIR HFA) 108 (90 Base) MCG/ACT inhaler, Inhale 2 puffs into the lungs., Disp: , Rfl:  .  aspirin EC 81 MG tablet, Take 81 mg by mouth., Disp: , Rfl:  .  beclomethasone (BECONASE-AQ) 42 MCG/SPRAY nasal spray, Place 2 sprays into the nose., Disp: , Rfl:  .  benzonatate (TESSALON) 200 MG capsule, Take 200 mg by mouth 3 (three) times daily as needed for cough., Disp: , Rfl:  .  budesonide-formoterol (SYMBICORT) 160-4.5 MCG/ACT inhaler, Inhale 2 puffs into the lungs., Disp: , Rfl:  .  buPROPion (WELLBUTRIN) 100 MG tablet, Take 100 mg by mouth., Disp: , Rfl:  .  busPIRone (BUSPAR) 10 MG tablet, Take 10 mg by mouth 3 (three) times daily. , Disp: , Rfl:  .  cetirizine (ZYRTEC) 10 MG tablet, Take 10 mg by mouth daily., Disp: , Rfl:  .  Cholecalciferol (VITAMIN D3 SUPER STRENGTH) 2000 units TABS, Take 2,000 Units by mouth., Disp: , Rfl:  .  docusate sodium (STOOL SOFTENER) 100 MG capsule, Take 100 mg by mouth., Disp: , Rfl:  .  fluticasone (FLONASE) 50 MCG/ACT nasal spray, Place 1 spray into the nose., Disp: , Rfl:  .  glucose blood test strip, 1 each by Other route as needed for other. Use as instructed, Disp: , Rfl:  .  insulin aspart protamine - aspart (NOVOLOG 70/30 MIX) (70-30) 100 UNIT/ML FlexPen, Inject 18 Units into the skin., Disp: , Rfl:  .  lisinopril  (PRINIVIL,ZESTRIL) 10 MG tablet, Take 10 mg by mouth daily., Disp: , Rfl:  .  metFORMIN (GLUCOPHAGE) 500 MG tablet, Take 500 mg by mouth 2 (two) times daily after a meal. , Disp: , Rfl:  .  metoprolol succinate (TOPROL-XL) 100 MG 24 hr tablet, Take 100 mg by mouth daily. Take with or immediately following a meal., Disp: , Rfl:  .  metoprolol tartrate (LOPRESSOR) 25 MG tablet, Take 25 mg by mouth., Disp: , Rfl:  .  montelukast (SINGULAIR) 10 MG tablet, Take 10 mg by mouth., Disp: , Rfl:  .  nicotine polacrilex (COMMIT) 2 MG lozenge, Place inside cheek., Disp: , Rfl:  .  nitroGLYCERIN (NITROSTAT) 0.4 MG SL tablet, Place 0.4 mg under the tongue., Disp: , Rfl:  .  Omega-3 1000 MG CAPS, Take 2 g by mouth., Disp: , Rfl:  .  pramipexole (MIRAPEX) 0.5 MG tablet, Take 0.5 mg by mouth., Disp: , Rfl:  .  pregabalin (LYRICA) 100 MG capsule, Take 200 mg by mouth every morning. , Disp: , Rfl:  .  ranitidine (ZANTAC) 150 MG capsule, Take 150 mg by mouth 2 (two) times daily. , Disp: , Rfl:  .  rosuvastatin (CRESTOR) 20 MG tablet, Take 20 mg by mouth., Disp: , Rfl:  .  tiotropium (SPIRIVA) 18 MCG inhalation capsule, Place 18 mcg into  inhaler and inhale., Disp: , Rfl:  .  VITAMIN B COMPLEX-C CAPS, Take 1 tablet by mouth., Disp: , Rfl:  .  vitamin E 400 UNIT capsule, Take 400 Units by mouth., Disp: , Rfl:   Past Medical History: Past Medical History:  Diagnosis Date  . Asthma   . Diabetes ([place])   . Hyperlipidemia   . Hypertension   . Myocardial infarction ([place])   . Rheumatoid arthritis ([place])   . Sleep apnea     Tobacco Use: Social History   Tobacco Use  Smoking Status Current Every Day Smoker  . Packs/day: 0.25  . Years: 57.00  . Pack years: 14.25  . Types: Cigarettes  Smokeless Tobacco Former [place]   reports smoking 1 cig each morning.    Labs: Recent Review Flowsheet Data    There is no flowsheet data to display.       Exercise Target Goals:    Exercise Program  Goal: Individual exercise prescription set using results from initial 6 min walk test and THRR while considering  patient's activity barriers and safety.   Exercise Prescription Goal: Initial exercise prescription builds to 30-45 minutes a day of aerobic activity, 2-3 days per week.  Home exercise guidelines will be given to patient during program as part of exercise prescription that the participant will acknowledge.  Activity Barriers & Risk Stratification: Activity Barriers & Cardiac Risk Stratification - 03/14/17 1436      Activity Barriers & Cardiac Risk Stratification   Activity Barriers  Arthritis;Back Problems;Shortness of Breath;Muscular Weakness    Cardiac Risk Stratification  Moderate       6 Minute Walk: 6 Minute Walk    Row Name 03/14/17 1427 05/19/17 1637       6 Minute Walk   Phase  Initial  Discharge    Distance  902 feet  1170 feet    Distance % Change  -  29 %    Distance Feet Change  -  268 ft    Walk Time  6 minutes  6 minutes    # of Rest Breaks  0  0    MPH  1.71  2.2    METS  2.05  2.83    RPE  13  12    Perceived Dyspnea   2  0    VO2 Peak  7.17  9.9    Symptoms  Yes (comment)  No    Comments  SOB  -    Resting HR  67 bpm  77 bpm    Resting BP  126/64  148/82    Resting Oxygen Saturation   98 %  -    Exercise Oxygen Saturation  during 6 min walk  95 %  95 %    Max Ex. HR  104 bpm  105 bpm    Max Ex. BP  176/84  214/84    2 Minute Post BP  132/76  -       Oxygen Initial Assessment:   Oxygen Re-Evaluation:   Oxygen Discharge (Final Oxygen Re-Evaluation):   Initial Exercise Prescription: Initial Exercise Prescription - 03/14/17 1400      Date of Initial Exercise RX and Referring Provider   Date  03/14/17    Referring Provider  [name] MD  [facility]      Treadmill   MPH  1.4    Grade  0.5    Minutes  15    METs  2.17  Arm Ergometer   Level  1    Watts  38    RPM  25    Minutes  15    METs  2      T5 Nustep    Level  1    SPM  80    Minutes  15    METs  2      Prescription Details   Frequency (times per week)  3    Duration  Progress to 45 minutes of aerobic exercise without signs/symptoms of physical distress      Intensity   THRR 40-80% of Max Heartrate  101-134    Ratings of Perceived Exertion  11-13    Perceived Dyspnea  0-4      Progression   Progression  Continue to progress workloads to maintain intensity without signs/symptoms of physical distress.      Resistance Training   Training Prescription  Yes    Weight  3 lbs    Reps  10-15       Perform Capillary Blood Glucose checks as needed.  Exercise Prescription Changes: Exercise Prescription Changes    Row Name 03/14/17 1400 03/30/17 1400 04/13/17 1500 04/25/17 1700 04/27/17 1500     Response to Exercise   Blood Pressure (Admit)  126/64  140/70  124/63  -  144/73   Blood Pressure (Exercise)  176/84  162/80  174/74  -  134/72   Blood Pressure (Exit)  132/76  130/60  134/74  -  -   Heart Rate (Admit)  67 bpm  68 bpm  78 bpm  -  85 bpm   Heart Rate (Exercise)  104 bpm  122 bpm  116 bpm  -  122 bpm   Heart Rate (Exit)  75 bpm  92 bpm  91 bpm  -  61 bpm   Oxygen Saturation (Admit)  98 %  -  -  -  -   Oxygen Saturation (Exercise)  95 %  -  -  -  -   Rating of Perceived Exertion (Exercise)  '13  13  12  '$ -  13   Perceived Dyspnea (Exercise)  2  -  -  -  -   Symptoms  SOB  none  none  -  none   Comments  walk test results  -  -  -  -   Intensity  -  THRR unchanged  THRR unchanged  -  THRR unchanged     Progression   Progression  -  Continue to progress workloads to maintain intensity without signs/symptoms of physical distress.  Continue to progress workloads to maintain intensity without signs/symptoms of physical distress.  -  Continue to progress workloads to maintain intensity without signs/symptoms of physical distress.   Average METs  -  -  2.8  -  3.75     Resistance Training   Training Prescription  -  Yes  Yes  -   Yes   Weight  -  3 lb  3 lb  -  3 lb   Reps  -  10-15  10-15  -  10-15     Interval Training   Interval Training  -  -  -  -  No     Treadmill   MPH  -  -  1.4  -  -   Grade  -  -  0.5  -  -     NuStep   Level  -  1  -  -  7   SPM  -  80  -  -  80   Minutes  -  15  -  -  15   METs  -  -  -  -  3.6     Arm Ergometer   Level  -  1  1  -  7   Minutes  -  15  15  -  15   METs  -  -  3.4  -  3.9     Home Exercise Plan   Plans to continue exercise at  -  -  -  Home (comment) TM, bike  Home (comment) TM, bike   Frequency  -  -  -  Add 2 additional days to program exercise sessions.  Add 2 additional days to program exercise sessions.   Initial Home Exercises Provided  -  -  -  04/25/17  04/25/17   Row Name 05/11/17 1500             Response to Exercise   Blood Pressure (Admit)  138/70       Blood Pressure (Exercise)  194/78       Blood Pressure (Exit)  124/64       Heart Rate (Admit)  92 bpm       Heart Rate (Exercise)  117 bpm       Heart Rate (Exit)  93 bpm       Rating of Perceived Exertion (Exercise)  13       Symptoms  none       Intensity  THRR unchanged         Progression   Progression  Continue to progress workloads to maintain intensity without signs/symptoms of physical distress.       Average METs  2.8         Resistance Training   Training Prescription  Yes       Weight  3 lb       Reps  10-15         Treadmill   MPH  2       Grade  1       Minutes  15       METs  2.8         NuStep   Level  7       SPM  80       Minutes  15          Exercise Comments: Exercise Comments    Row Name 03/24/17 1714 04/25/17 1739 05/23/17 1635       Exercise Comments  First full day of exercise!  Patient was oriented to gym and equipment including functions, settings, policies, and procedures.  Patient's individual exercise prescription and treatment plan were reviewed.  All starting workloads were established based on the results of the 6 minute walk test done  at initial orientation visit.  The plan for exercise progression was also introduced and progression will be customized based on patient's performance and goals.  Reviewed home exercise with pt today.  Pt plans to use bike and TM at home for exercise.  Reviewed THR, pulse, RPE, sign and symptoms, NTG use, and when to call 911 or MD.  Also discussed weather considerations and indoor options.  Pt voiced understanding.  -        Exercise Goals and Review: Exercise Goals    Row Name 03/14/17 1443  Exercise Goals   Increase Physical Activity  Yes       Intervention  Provide advice, education, support and counseling about physical activity/exercise needs.;Develop an individualized exercise prescription for aerobic and resistive training based on initial evaluation findings, risk stratification, comorbidities and participant's personal goals.       Expected Outcomes  Short Term: Attend rehab on a regular basis to increase amount of physical activity.;Long Term: Add in home exercise to make exercise part of routine and to increase amount of physical activity.;Long Term: Exercising regularly at least 3-5 days a week.       Increase Strength and Stamina  Yes       Intervention  Provide advice, education, support and counseling about physical activity/exercise needs.;Develop an individualized exercise prescription for aerobic and resistive training based on initial evaluation findings, risk stratification, comorbidities and participant's personal goals.       Expected Outcomes  Short Term: Increase workloads from initial exercise prescription for resistance, speed, and METs.;Short Term: Perform resistance training exercises routinely during rehab and add in resistance training at home;Long Term: Improve cardiorespiratory fitness, muscular endurance and strength as measured by increased METs and functional capacity (6MWT)       Able to understand and use rate of perceived exertion (RPE) scale  Yes        Intervention  Provide education and explanation on how to use RPE scale       Expected Outcomes  Short Term: Able to use RPE daily in rehab to express subjective intensity level;Long Term:  Able to use RPE to guide intensity level when exercising independently       Able to understand and use Dyspnea scale  Yes       Intervention  Provide education and explanation on how to use Dyspnea scale       Expected Outcomes  Short Term: Able to use Dyspnea scale daily in rehab to express subjective sense of shortness of breath during exertion;Long Term: Able to use Dyspnea scale to guide intensity level when exercising independently       Knowledge and understanding of Target Heart Rate Range (THRR)  Yes       Intervention  Provide education and explanation of THRR including how the numbers were predicted and where they are located for reference       Expected Outcomes  Short Term: Able to state/look up THRR;Long Term: Able to use THRR to govern intensity when exercising independently;Short Term: Able to use daily as guideline for intensity in rehab       Able to check pulse independently  Yes       Intervention  Provide education and demonstration on how to check pulse in carotid and radial arteries.;Review the importance of being able to check your own pulse for safety during independent exercise       Expected Outcomes  Short Term: Able to explain why pulse checking is important during independent exercise;Long Term: Able to check pulse independently and accurately       Understanding of Exercise Prescription  Yes       Intervention  Provide education, explanation, and written materials on patient's individual exercise prescription       Expected Outcomes  Short Term: Able to explain program exercise prescription;Long Term: Able to explain home exercise prescription to exercise independently          Exercise Goals Re-Evaluation : Exercise Goals Re-Evaluation    Row Name 03/24/17 1714 03/30/17  1432 04/13/17 1527  04/18/17 1644 04/25/17 1739     Exercise Goal Re-Evaluation   Exercise Goals Review  Understanding of Exercise Prescription;Knowledge and understanding of Target Heart Rate Range (THRR);Able to understand and use rate of perceived exertion (RPE) scale  Increase Physical Activity;Increase Strength and Stamina;Able to understand and use Dyspnea scale;Able to understand and use rate of perceived exertion (RPE) scale  Increase Physical Activity;Increase Strength and Stamina;Able to understand and use rate of perceived exertion (RPE) scale  Increase Physical Activity;Increase Strength and Stamina  Increase Physical Activity;Increase Strength and Stamina;Knowledge and understanding of Target Heart Rate Range (THRR);Able to understand and use rate of perceived exertion (RPE) scale;Able to check pulse independently;Understanding of Exercise Prescription   Comments  Reviewed RPE scale, THR and program prescription with pt today.  Pt voiced understanding and was given a copy of goals to take home.   Pt is tolerating exercise well.    Sohail continues to tolerate exercise well. Staff will continue to monitor progress  Brysyn states that he can walk further before resting now that he has been more consistant with his exercise.   Reviewed home exercise with pt today.  Pt plans to use bike and TM at home for exercise.  Reviewed THR, pulse, RPE, sign and symptoms, NTG use, and when to call 911 or MD.  Also discussed weather considerations and indoor options.  Pt voiced understanding.   Expected Outcomes  Short: Use RPE daily to regulate intensity.  Long: Follow program prescription in THR.   Short - Pt will continue to attend regularly Long - Pt will improve overall MET level  Short - Pt will attend classes regularly Long - Pt will maintain independent exercise  -  Short - Agustus will clean the clothes off his TM so he can use it Long - Micael will exrecise on his own   Row Name 05/11/17 1547              Exercise Goal Re-Evaluation   Exercise Goals Review  Increase Physical Activity;Increase Strength and Stamina;Able to understand and use rate of perceived exertion (RPE) scale;Knowledge and understanding of Target Heart Rate Range (THRR)       Comments  [place] tolerates exercise well. Staff will continue to monitor       Expected Outcomes  Short - Jaimes will continue to attend regularly  Long - Livio will maintain exercise on his own          Discharge Exercise Prescription (Final Exercise Prescription Changes): Exercise Prescription Changes - 05/11/17 1500      Response to Exercise   Blood Pressure (Admit)  138/70    Blood Pressure (Exercise)  194/78    Blood Pressure (Exit)  124/64    Heart Rate (Admit)  92 bpm    Heart Rate (Exercise)  117 bpm    Heart Rate (Exit)  93 bpm    Rating of Perceived Exertion (Exercise)  13    Symptoms  none    Intensity  THRR unchanged      Progression   Progression  Continue to progress workloads to maintain intensity without signs/symptoms of physical distress.    Average METs  2.8      Resistance Training   Training Prescription  Yes    Weight  3 lb    Reps  10-15      Treadmill   MPH  2    Grade  1    Minutes  15    METs  2.8  NuStep   Level  7    SPM  80    Minutes  15       Nutrition:  Target Goals: Understanding of nutrition guidelines, daily intake of sodium '1500mg'$ , cholesterol '200mg'$ , calories 30% from fat and 7% or less from saturated fats, daily to have 5 or more servings of fruits and vegetables.  Biometrics: Pre Biometrics - 03/14/17 1444      Pre Biometrics   Height  5' 8.9" (1.75 m)    Weight  254 lb 14.4 oz (115.6 kg)    Waist Circumference  48 inches    Hip Circumference  48 inches    Waist to Hip Ratio  1 %    BMI (Calculated)  37.75    Single Leg Stand  3.34 seconds      Post Biometrics - 05/19/17 1636       Post  Biometrics   Height  5' 8.9" (1.75 m)    Weight  254 lb (115.2 kg)    Waist  Circumference  46 inches    Hip Circumference  48 inches    Waist to Hip Ratio  0.96 %    BMI (Calculated)  37.62    Single Leg Stand  10 seconds       Nutrition Therapy Plan and Nutrition Goals: Nutrition Therapy & Goals - 03/14/17 1432      Intervention Plan   Intervention  Prescribe, educate and counsel regarding individualized specific dietary modifications aiming towards targeted core components such as weight, hypertension, lipid management, diabetes, heart failure and other comorbidities.;Nutrition handout(s) given to patient.    Expected Outcomes  Short Term Goal: Understand basic principles of dietary content, such as calories, fat, sodium, cholesterol and nutrients.;Short Term Goal: A plan has been developed with personal nutrition goals set during dietitian appointment.;Long Term Goal: Adherence to prescribed nutrition plan.       Nutrition Assessments: Nutrition Assessments - 05/16/17 1630      MEDFICTS Scores   Post Score  38       Nutrition Goals Re-Evaluation: Nutrition Goals Re-Evaluation    Row Name 04/18/17 1642 05/11/17 1554           Goals   Current Weight  -  256 lb (116.1 kg)      Nutrition Goal  Darril does not want to meet with the dietician in the cardiac rehab program. He states that he meets with a dietician at the [place] 2x a year and is comfortable following the nutician advise he gets from that dietician.   Davison said he knows what to eat and has met with the [place]. He feels he doesn't need to meet with our dietician now.       Comment  -  Jaeceon said that he "all my problems are due to [place] in [place]" including my diabetes but reports his blood sugars upon awaking are 130 and before supper are 125-130.        Expected Outcome  -  Heart healthy eating          Nutrition Goals Discharge (Final Nutrition Goals Re-Evaluation): Nutrition Goals Re-Evaluation - 05/11/17 1554      Goals   Current Weight  256 lb (116.1 kg)     Nutrition Goal  Anup said he knows what to eat and has met with the [place]. He feels he doesn't need to meet with our dietician now.     Comment  Chioke said that  he "all my problems are due to [place] in [place]" including my diabetes but reports his blood sugars upon awaking are 130 and before supper are 125-130.      Expected Outcome  Heart healthy eating        Psychosocial: Target Goals: Acknowledge presence or absence of significant depression and/or stress, maximize coping skills, provide positive support system. Participant is able to verbalize types and ability to use techniques and skills needed for reducing stress and depression.   Initial Review & Psychosocial Screening: Initial Psych Review & Screening - 03/14/17 1433      Initial Review   Current issues with  Current Sleep Concerns He and his wife have been having issues sleeping for the last 4 months      Victor?  Yes      Screening Interventions   Interventions  Encouraged to exercise;Program counselor consult    Expected Outcomes  Short Term goal: Utilizing psychosocial counselor, staff and physician to assist with identification of specific Stressors or current issues interfering with healing process. Setting desired goal for each stressor or current issue identified.;Long Term Goal: Stressors or current issues are controlled or eliminated.;Short Term goal: Identification and review with participant of any Quality of Life or Depression concerns found by scoring the questionnaire.;Long Term goal: The participant improves quality of Life and PHQ9 Scores as seen by post scores and/or verbalization of changes       Quality of Life Scores:  Quality of Life - 05/16/17 1629      Quality of Life Scores   Health/Function Post  24.6 %    Socioeconomic Post  29.06 %    Psych/Spiritual Post  28.93 %    Family Post  30 %    GLOBAL Post  27.26 %      Scores of 19 and below  usually indicate a poorer quality of life in these areas.  A difference of  2-3 points is a clinically meaningful difference.  A difference of 2-3 points in the total score of the Quality of Life Index has been associated with significant improvement in overall quality of life, self-image, physical symptoms, and general health in studies assessing change in quality of life.  PHQ-9: Recent Review Flowsheet Data    Depression screen [facility] 2/9 05/16/2017 03/14/2017 07/04/2015 06/27/2015 02/18/2015   Decreased Interest 0 0 0 0 0   Down, Depressed, Hopeless 0 0 0 0 0   PHQ - 2 Score 0 0 0 0 0   Altered sleeping 1 1 0 0 -   Tired, decreased energy '1 2 1 1 '$ -   Change in appetite 0 0 0 0 -   Feeling bad or failure about yourself  0 0 1 1 -   Trouble concentrating 0 0 0 0 -   Moving slowly or fidgety/restless 0 0 0 0 -   Suicidal thoughts 0 0 0 0 -   PHQ-9 Score '2 3 2 2 '$ -   Difficult doing work/chores Somewhat difficult Not difficult at all Not difficult at all Not difficult at all -     Interpretation of Total Score  Total Score Depression Severity:  1-4 = Minimal depression, 5-9 = Mild depression, 10-14 = Moderate depression, 15-19 = Moderately severe depression, 20-27 = Severe depression   Psychosocial Evaluation and Intervention: Psychosocial Evaluation - 03/28/17 1646      Psychosocial Evaluation & Interventions   Comments  Counselor met with Mr. Diloreto (  Simeon Craft) today for psychosocial evaluation for Cardiac Rehab.  He was in our Pulmonary Rehab program several years ago.  He continues to have a strong suport system with a spouse of 81 years and (2) daughters who live locally.  Masao has diabetes and sleep apnea, as well as COPD and heart disease.  He reports sleeping well with the use of a CPAP.  He has a good appetite and has goals to lose some weight while in this program.  Andru reports a history of some anxiety in the past and has been on medication for this for approximately 10 years.  He states  this is working well for him and treats his current symptoms.  He states his stress is minimal other than his chronic health conditions.  He has goals to increase his stamina and strength as well as lose weight.  He has gym equipment to use at home once he completes this program.  Jaydyn will be followed by staff while here.      Expected Outcomes  Gleason will benefit from consistent exercise to achieve his states goals.  The educational and psychoeducational components of this program will be helpful in understanding his medical condition and coping more positively.  Cosme will meet with the dietician to address his weight loss goals.      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  Keiondre said fishing is his stress release especially fishing at the [employer].     Expected Outcomes  Keep stress low by fishing    Interventions  Encouraged to attend Cardiac Rehabilitation for the exercise       Vocational Rehabilitation: Provide vocational rehab assistance to qualifying candidates.   Vocational Rehab Evaluation & Intervention: Vocational Rehab - 03/14/17 1435      Initial Vocational Rehab Evaluation & Intervention   Assessment shows need for Vocational Rehabilitation  No       Education: Education Goals: Education classes will be provided on a variety of  topics geared toward better understanding of heart health and risk factor modification. Participant will state understanding/return demonstration of topics presented as noted by education test scores.  Learning Barriers/Preferences: Learning Barriers/Preferences - 03/14/17 1434      Learning Barriers/Preferences   Learning Barriers  Hearing;Exercise Concerns    Learning Preferences  None       Education Topics:  AED/CPR: - Group verbal and written instruction with the use of models to demonstrate the basic use of the AED with the basic ABC's of resuscitation.   Cardiac Rehab from 05/23/2017 in [facility] Cardiac and Pulmonary Rehab  Date  04/04/17  Educator  MA  Instruction Review Code  1- Verbalizes Understanding  General Nutrition Guidelines/Fats and Fiber: -Group instruction provided by verbal, written material, models and posters to present the general guidelines for heart healthy nutrition. Gives an explanation and review of dietary fats and fiber.   Cardiac Rehab from 05/23/2017 in [facility] Cardiac and Pulmonary Rehab  Date  05/16/17  Educator  PI  Instruction Review Code  1- Verbalizes Understanding      Controlling Sodium/Reading Food Labels: -Group verbal and written material supporting the discussion of sodium use in heart healthy nutrition. Review and explanation with models, verbal and written materials for utilization of the food label.   Cardiac Rehab from 05/23/2017 in [facility] Cardiac and Pulmonary Rehab  Date  05/23/17  Educator  PI  Instruction Review Code  1- Verbalizes Understanding      Exercise Physiology & General Exercise Guidelines: - Group verbal and written instruction with models to review the exercise physiology of the cardiovascular system and associated critical values. Provides general exercise guidelines with specific guidelines to those with heart or lung disease.    Cardiac Rehab from 05/23/2017 in [facility] Cardiac and Pulmonary Rehab  Date  04/11/17  Educator   [facility]  Instruction Review Code  1- Verbalizes Understanding      Aerobic Exercise & Resistance Training: - Gives group verbal and written instruction on the various components of exercise. Focuses on aerobic and resistive training programs and the benefits of this training and how to safely progress through these programs..   Cardiac Rehab from 05/23/2017 in [facility] Cardiac and Pulmonary Rehab  Date  04/18/17  Educator  AS  Instruction Review Code  1- Verbalizes Understanding      Flexibility, Balance, Mind/Body Relaxation: Provides group verbal/written instruction on the benefits of flexibility and balance training, including mind/body exercise modes such as yoga, pilates and tai chi.  Demonstration and skill practice provided.   Cardiac Rehab from 05/23/2017 in [facility] Cardiac and Pulmonary Rehab  Date  04/20/17  Educator  AS  Instruction Review Code  1- Verbalizes Understanding      Stress and Anxiety: - Provides group verbal and written instruction about the health risks of elevated stress and causes of high stress.  Discuss the correlation between heart/lung disease and anxiety and treatment options. Review healthy ways to manage with stress and anxiety.   Cardiac Rehab from 05/23/2017 in [facility] Cardiac and Pulmonary Rehab  Date  04/27/17  Educator  [facility]  Instruction Review Code  1- Verbalizes Understanding      Depression: - Provides group verbal and written instruction on the correlation between heart/lung disease and depressed mood, treatment options, and the stigmas associated with seeking treatment.   Cardiac Rehab from 05/23/2017 in [facility] Cardiac and Pulmonary Rehab  Date  04/13/17  Educator  [facility]  Instruction Review Code  1- Verbalizes Understanding      Anatomy & Physiology of the Heart: - Group verbal and written instruction and models provide basic cardiac anatomy and physiology, with the coronary electrical and arterial systems. Review of Valvular disease and Heart Failure    Cardiac Rehab from 05/23/2017 in [facility] Cardiac and Pulmonary Rehab  Date  05/09/17  Educator  CE  Instruction Review Code  1- Verbalizes Understanding      Cardiac Procedures: - Group verbal and written instruction to review commonly prescribed medications for heart disease. Reviews the medication, class of the drug, and side effects. Includes the steps to properly store meds and maintain the prescription regimen. (beta blockers and nitrates)   Cardiac Rehab from 05/23/2017 in  [place] Cardiac and Pulmonary Rehab  Date  05/18/17  Educator  [facility]  Instruction Review Code  1- Verbalizes Understanding      Cardiac Medications I: - Group verbal and written instruction to review commonly prescribed medications for heart disease. Reviews the medication, class of the drug, and side effects. Includes the steps to properly store meds and maintain the prescription regimen.   Cardiac Rehab from 05/23/2017 in [facility] Cardiac and Pulmonary Rehab  Date  05/02/17  Educator  CE  Instruction Review Code  1- Verbalizes Understanding      Cardiac Medications II: -Group verbal and written instruction to review commonly prescribed medications for heart disease. Reviews the medication, class of the drug, and side effects. (all other drug classes)   Cardiac Rehab from 05/23/2017 in [facility] Cardiac and Pulmonary Rehab  Date  04/25/17  Educator  CE  Instruction Review Code  1- Verbalizes Understanding       Go Sex-Intimacy & Heart Disease, Get SMART - Goal Setting: - Group verbal and written instruction through game format to discuss heart disease and the return to sexual intimacy. Provides group verbal and written material to discuss and apply goal setting through the application of the S.M.A.R.T. Method.   Cardiac Rehab from 05/23/2017 in [facility] Cardiac and Pulmonary Rehab  Date  05/18/17  Educator  [facility]  Instruction Review Code  1- Verbalizes Understanding      Other Matters of the Heart: - Provides group verbal, written  materials and models to describe Stable Angina and Peripheral Artery. Includes description of the disease process and treatment options available to the cardiac patient.   Exercise & Equipment Safety: - Individual verbal instruction and demonstration of equipment use and safety with use of the equipment.   Cardiac Rehab from 05/23/2017 in [facility] Cardiac and Pulmonary Rehab  Date  03/14/17  Educator  [facility]  Instruction Review Code  1- Verbalizes Understanding      Infection Prevention: - Provides verbal and written material to individual with discussion of infection control including proper hand washing and proper equipment cleaning during exercise session.   Cardiac Rehab from 05/23/2017 in [facility] Cardiac and Pulmonary Rehab  Date  03/14/17  Educator  [facility]  Instruction Review Code  1- Verbalizes Understanding      Falls Prevention: - Provides verbal and written material to individual with discussion of falls prevention and safety.   Cardiac Rehab from 05/23/2017 in [facility] Cardiac and Pulmonary Rehab  Date  03/14/17  Educator  [facility]  Instruction Review Code  1- Verbalizes Understanding      Diabetes: - Individual verbal and written instruction to review signs/symptoms of diabetes, desired ranges of glucose level fasting, after meals and with exercise. Acknowledge that pre and post exercise glucose checks will be done for 3 sessions at entry of program.   Cardiac Rehab from 05/23/2017 in [facility] Cardiac and Pulmonary Rehab  Date  03/14/17 [05/11/2016 Sleep lecture done by Lucianne Lei, MSW]  Educator  [facility]  Instruction Review Code  1- Verbalizes Understanding      Know Your Numbers and Risk Factors: -Group verbal and written instruction about important numbers in your health.  Discussion of what are risk factors and how they play a role in the disease process.  Review of Cholesterol, Blood Pressure, Diabetes, and BMI and the role they play in your overall health.   Cardiac Rehab from 05/23/2017 in [facility]  Cardiac and Pulmonary Rehab  Date  04/25/17  Educator  CE  Instruction Review  Code  1- Verbalizes Understanding      Sleep Hygiene: -Provides group verbal and written instruction about how sleep can affect your health.  Define sleep hygiene, discuss sleep cycles and impact of sleep habits. Review good sleep hygiene tips.    Cardiac Rehab from 05/23/2017 in [facility] Cardiac and Pulmonary Rehab  Date  05/11/17  Educator  [facility]  Instruction Review Code  1- Verbalizes Understanding      Other: -Provides group and verbal instruction on various topics (see comments)   Knowledge Questionnaire Score: Knowledge Questionnaire Score - 05/16/17 1629      Knowledge Questionnaire Score   Post Score  27/28       Core Components/Risk Factors/Patient Goals at Admission: Personal Goals and Risk Factors at Admission - 03/14/17 1423      Core Components/Risk Factors/Patient Goals on Admission    Weight Management  Yes;Obesity;Weight Loss    Intervention  Weight Management: Develop a combined nutrition and exercise program designed to reach desired caloric intake, while maintaining appropriate intake of nutrient and fiber, sodium and fats, and appropriate energy expenditure required for the weight goal.;Weight Management: Provide education and appropriate resources to help participant work on and attain dietary goals.;Weight Management/Obesity: Establish reasonable short term and long term weight goals.;Obesity: Provide education and appropriate resources to help participant work on and attain dietary goals.    Admit Weight  254 lb 14.4 oz (115.6 kg)    Goal Weight: Short Term  250 lb (113.4 kg)    Goal Weight: Long Term  210 lb (95.3 kg)    Expected Outcomes  Short Term: Continue to assess and modify interventions until short term weight is achieved;Long Term: Adherence to nutrition and physical activity/exercise program aimed toward attainment of established weight goal;Weight Loss: Understanding of general  recommendations for a balanced deficit meal plan, which promotes 1-2 lb weight loss per week and includes a negative energy balance of [phone] kcal/d;Understanding of distribution of calorie intake throughout the day with the consumption of 4-5 meals/snacks;Understanding recommendations for meals to include 15-35% energy as protein, 25-35% energy from fat, 35-60% energy from carbohydrates, less than '200mg'$  of dietary cholesterol, 20-35 gm of total fiber daily    Tobacco Cessation  Yes    Number of packs per day  1 cig each morning    Intervention  Assist the participant in steps to quit. Provide individualized education and counseling about committing to Tobacco Cessation, relapse prevention, and pharmacological support that can be provided by physician.;Advice worker, assist with locating and accessing local/national Quit Smoking programs, and support quit date choice.    Expected Outcomes  Short Term: Will demonstrate readiness to quit, by selecting a quit date.;Long Term: Complete abstinence from all tobacco products for at least 12 months from quit date.;Short Term: Will quit all tobacco product use, adhering to prevention of relapse plan.    Diabetes  Yes    Intervention  Provide education about signs/symptoms and action to take for hypo/hyperglycemia.;Provide education about proper nutrition, including hydration, and aerobic/resistive exercise prescription along with prescribed medications to achieve blood glucose in normal ranges: Fasting glucose 65-99 mg/dL    Expected Outcomes  Short Term: Participant verbalizes understanding of the signs/symptoms and immediate care of hyper/hypoglycemia, proper foot care and importance of medication, aerobic/resistive exercise and nutrition plan for blood glucose control.;Long Term: Attainment of HbA1C < 7%.    Hypertension  Yes    Intervention  Provide education on lifestyle modifcations including regular physical activity/exercise, weight  management,  moderate sodium restriction and increased consumption of fresh fruit, vegetables, and low fat dairy, alcohol moderation, and smoking cessation.;Monitor prescription use compliance.    Expected Outcomes  Short Term: Continued assessment and intervention until BP is < 140/18m HG in hypertensive participants. < 130/861mHG in hypertensive participants with diabetes, heart failure or chronic kidney disease.;Long Term: Maintenance of blood pressure at goal levels.    Lipids  Yes    Intervention  Provide education and support for participant on nutrition & aerobic/resistive exercise along with prescribed medications to achieve LDL '70mg'$ , HDL >'40mg'$ .    Expected Outcomes  Short Term: Participant states understanding of desired cholesterol values and is compliant with medications prescribed. Participant is following exercise prescription and nutrition guidelines.;Long Term: Cholesterol controlled with medications as prescribed, with individualized exercise RX and with personalized nutrition plan. Value goals: LDL < '70mg'$ , HDL > 40 mg.       Core Components/Risk Factors/Patient Goals Review:  Goals and Risk Factor Review    Row Name 04/18/17 1638 05/11/17 1621 05/11/17 1622         Core Components/Risk Factors/Patient Goals Review   Personal Goals Review  Weight Management/Obesity;Lipids;Hypertension;Diabetes;Improve shortness of breath with ADL's  -  Weight Management/Obesity;Hypertension;Diabetes     Review  LeCaiusas not lost weight yet but we discussed healthy weight loss priniples and the difference between fat weight and muscle weight. He is taking all cardiac meds as prescribed to manage his diabetes, lipids, and blood pressure. He is attending cardiac rehab on a regular basis and meets with a dietician from the [employer] [place].   WEight is 250  weight is 256lbs today not 250lbs . Blood sugars before meals LeShishireports that they are 125-130. His blood pressure today was 150/64 but that was when  he first arrived to Cardiac REhab.      Expected Outcomes  Short: Weight goal 250 lbs Long: 210 lbs.   -  STill short term goal of 250lbs. Long term even 240lbs at this point.         Core Components/Risk Factors/Patient Goals at Discharge (Final Review):  Goals and Risk Factor Review - 05/11/17 1622      Core Components/Risk Factors/Patient Goals Review   Personal Goals Review  Weight Management/Obesity;Hypertension;Diabetes    Review  weight is 256lbs today not 250lbs . Blood sugars before meals LeJullianeports that they are 125-130. His blood pressure today was 150/64 but that was when he first arrived to Cardiac REhab.     Expected Outcomes  STill short term goal of 250lbs. Long term even 240lbs at this point.        ITP Comments: ITP Comments    Row Name 03/14/17 1400 03/23/17 0645 03/31/17 1409 04/20/17 0611 05/18/17 0654   ITP Comments  Med review completed. Initial ITP created. Diagnosis can be found in Media [facility] [place] [employer] [place]  30 Day review. Continue with ITP unless directed changes per Medical Director review.   New to program  LeMcgregoralled to say he is sorry that he can not attend Cardiac Rehab today but he didn't sleep well due to coughing and sneezing. I suggested that he call his MD also.   30 day review. Continue with ITP unless directed changes per Medical Director review.  30 Day review. Continue with ITP unless directed changes per Medical Director review.        Comments:

## 2017-05-24 NOTE — Progress Notes (Signed)
Cardiac Individual Treatment Plan  Patient Details  Name: Jeffrey Burgess MRN: [id number] Date of Birth: 1945-05-10 Referring Provider:     Cardiac Rehab from 03/14/2017 in [facility] Cardiac [facility] Pul[facility] Rehab  Referring Provider  [name] MD  [name]      Initial Encounter Date:    Cardiac Rehab from 03/14/2017 in [facility] Cardiac and Pulmonary Rehab  Date  03/14/17  Referring Provider  [name] MD  [name]      Visit Diagnosis: Stable angina ([place])  Patient's Home Medications on Admission:  Current Outpatient Medications:  .  acetaminophen (TYLENOL) 500 MG tablet, Take 500 mg by mouth., Disp: , Rfl:  .  albuterol (PROAIR HFA) 108 (90 Base) MCG/ACT inhaler, Inhale 2 puffs into the lungs., Disp: , Rfl:  .  aspirin EC 81 MG tablet, Take 81 mg by mouth., Disp: , Rfl:  .  beclomethasone (BECONASE-AQ) 42 MCG/SPRAY nasal spray, Place 2 sprays into the nose., Disp: , Rfl:  .  benzonatate (TESSALON) 200 MG capsule, Take 200 mg by mouth 3 (three) times daily as needed for cough., Disp: , Rfl:  .  budesonide-formoterol (SYMBICORT) 160-4.5 MCG/ACT inhaler, Inhale 2 puffs into the lungs., Disp: , Rfl:  .  buPROPion (WELLBUTRIN) 100 MG tablet, Take 100 mg by mouth., Disp: , Rfl:  .  busPIRone (BUSPAR) 10 MG tablet, Take 10 mg by mouth 3 (three) times daily. , Disp: , Rfl:  .  cetirizine (ZYRTEC) 10 MG tablet, Take 10 mg by mouth daily., Disp: , Rfl:  .  Cholecalciferol (VITAMIN D3 SUPER STRENGTH) 2000 units TABS, Take 2,000 Units by mouth., Disp: , Rfl:  .  docusate sodium (STOOL SOFTENER) 100 MG capsule, Take 100 mg by mouth., Disp: , Rfl:  .  fluticasone (FLONASE) 50 MCG/ACT nasal spray, Place 1 spray into the nose., Disp: , Rfl:  .  glucose blood test strip, 1 each by Other route as needed for other. Use as instructed, Disp: , Rfl:  .  insulin aspart protamine - aspart (NOVOLOG 70/30 MIX) (70-30) 100 UNIT/ML FlexPen, Inject 18 Units into the skin., Disp: , Rfl:  .  lisinopril  (PRINIVIL,ZESTRIL) 10 MG tablet, Take 10 mg by mouth daily., Disp: , Rfl:  .  metFORMIN (GLUCOPHAGE) 500 MG tablet, Take 500 mg by mouth 2 (two) times daily after a meal. , Disp: , Rfl:  .  metoprolol succinate (TOPROL-XL) 100 MG 24 hr tablet, Take 100 mg by mouth daily. Take with or immediately following a meal., Disp: , Rfl:  .  metoprolol tartrate (LOPRESSOR) 25 MG tablet, Take 25 mg by mouth., Disp: , Rfl:  .  montelukast (SINGULAIR) 10 MG tablet, Take 10 mg by mouth., Disp: , Rfl:  .  nicotine polacrilex (COMMIT) 2 MG lozenge, Place inside cheek., Disp: , Rfl:  .  nitroGLYCERIN (NITROSTAT) 0.4 MG SL tablet, Place 0.4 mg under the tongue., Disp: , Rfl:  .  Omega-3 1000 MG CAPS, Take 2 g by mouth., Disp: , Rfl:  .  pramipexole (MIRAPEX) 0.5 MG tablet, Take 0.5 mg by mouth., Disp: , Rfl:  .  pregabalin (LYRICA) 100 MG capsule, Take 200 mg by mouth every morning. , Disp: , Rfl:  .  ranitidine (ZANTAC) 150 MG capsule, Take 150 mg by mouth 2 (two) times daily. , Disp: , Rfl:  .  rosuvastatin (CRESTOR) 20 MG tablet, Take 20 mg by mouth., Disp: , Rfl:  .  tiotropium (SPIRIVA) 18 MCG inhalation capsule, Place 18 mcg into  inhaler and inhale., Disp: , Rfl:  .  VITAMIN B COMPLEX-C CAPS, Take 1 tablet by mouth., Disp: , Rfl:  .  vitamin E 400 UNIT capsule, Take 400 Units by mouth., Disp: , Rfl:   Past Medical History: Past Medical History:  Diagnosis Date  . Asthma   . Diabetes ([place])   . Hyperlipidemia   . Hypertension   . Myocardial infarction ([place])   . Rheumatoid arthritis ([place])   . Sleep apnea     Tobacco Use: Social History   Tobacco Use  Smoking Status Current Every Day Smoker  . Packs/day: 0.25  . Years: 57.00  . Pack years: 14.25  . Types: Cigarettes  Smokeless Tobacco Former [place]   reports smoking 1 cig each morning.    Labs: Recent Review Flowsheet Data    There is no flowsheet data to display.       Exercise Target Goals:    Exercise Program  Goal: Individual exercise prescription set using results from initial 6 min walk test and THRR while considering  patient's activity barriers and safety.   Exercise Prescription Goal: Initial exercise prescription builds to 30-45 minutes a day of aerobic activity, 2-3 days per week.  Home exercise guidelines will be given to patient during program as part of exercise prescription that the participant will acknowledge.  Activity Barriers & Risk Stratification: Activity Barriers & Cardiac Risk Stratification - 03/14/17 1436      Activity Barriers & Cardiac Risk Stratification   Activity Barriers  Arthritis;Back Problems;Shortness of Breath;Muscular Weakness    Cardiac Risk Stratification  Moderate       6 Minute Walk: 6 Minute Walk    Row Name 03/14/17 1427 05/19/17 1637       6 Minute Walk   Phase  Initial  Discharge    Distance  902 feet  1170 feet    Distance % Change  -  29 %    Distance Feet Change  -  268 ft    Walk Time  6 minutes  6 minutes    # of Rest Breaks  0  0    MPH  1.71  2.2    METS  2.05  2.83    RPE  13  12    Perceived Dyspnea   2  0    VO2 Peak  7.17  9.9    Symptoms  Yes (comment)  No    Comments  SOB  -    Resting HR  67 bpm  77 bpm    Resting BP  126/64  148/82    Resting Oxygen Saturation   98 %  -    Exercise Oxygen Saturation  during 6 min walk  95 %  95 %    Max Ex. HR  104 bpm  105 bpm    Max Ex. BP  176/84  214/84    2 Minute Post BP  132/76  -       Oxygen Initial Assessment:   Oxygen Re-Evaluation:   Oxygen Discharge (Final Oxygen Re-Evaluation):   Initial Exercise Prescription: Initial Exercise Prescription - 03/14/17 1400      Date of Initial Exercise RX and Referring Provider   Date  03/14/17    Referring Provider  [name] MD  [facility]      Treadmill   MPH  1.4    Grade  0.5    Minutes  15    METs  2.17  Arm Ergometer   Level  1    Watts  38    RPM  25    Minutes  15    METs  2      T5 Nustep    Level  1    SPM  80    Minutes  15    METs  2      Prescription Details   Frequency (times per week)  3    Duration  Progress to 45 minutes of aerobic exercise without signs/symptoms of physical distress      Intensity   THRR 40-80% of Max Heartrate  101-134    Ratings of Perceived Exertion  11-13    Perceived Dyspnea  0-4      Progression   Progression  Continue to progress workloads to maintain intensity without signs/symptoms of physical distress.      Resistance Training   Training Prescription  Yes    Weight  3 lbs    Reps  10-15       Perform Capillary Blood Glucose checks as needed.  Exercise Prescription Changes: Exercise Prescription Changes    Row Name 03/14/17 1400 03/30/17 1400 04/13/17 1500 04/25/17 1700 04/27/17 1500     Response to Exercise   Blood Pressure (Admit)  126/64  140/70  124/63  -  144/73   Blood Pressure (Exercise)  176/84  162/80  174/74  -  134/72   Blood Pressure (Exit)  132/76  130/60  134/74  -  -   Heart Rate (Admit)  67 bpm  68 bpm  78 bpm  -  85 bpm   Heart Rate (Exercise)  104 bpm  122 bpm  116 bpm  -  122 bpm   Heart Rate (Exit)  75 bpm  92 bpm  91 bpm  -  61 bpm   Oxygen Saturation (Admit)  98 %  -  -  -  -   Oxygen Saturation (Exercise)  95 %  -  -  -  -   Rating of Perceived Exertion (Exercise)  '13  13  12  '$ -  13   Perceived Dyspnea (Exercise)  2  -  -  -  -   Symptoms  SOB  none  none  -  none   Comments  walk test results  -  -  -  -   Intensity  -  THRR unchanged  THRR unchanged  -  THRR unchanged     Progression   Progression  -  Continue to progress workloads to maintain intensity without signs/symptoms of physical distress.  Continue to progress workloads to maintain intensity without signs/symptoms of physical distress.  -  Continue to progress workloads to maintain intensity without signs/symptoms of physical distress.   Average METs  -  -  2.8  -  3.75     Resistance Training   Training Prescription  -  Yes  Yes  -   Yes   Weight  -  3 lb  3 lb  -  3 lb   Reps  -  10-15  10-15  -  10-15     Interval Training   Interval Training  -  -  -  -  No     Treadmill   MPH  -  -  1.4  -  -   Grade  -  -  0.5  -  -     NuStep   Level  -  1  -  -  7   SPM  -  80  -  -  80   Minutes  -  15  -  -  15   METs  -  -  -  -  3.6     Arm Ergometer   Level  -  1  1  -  7   Minutes  -  15  15  -  15   METs  -  -  3.4  -  3.9     Home Exercise Plan   Plans to continue exercise at  -  -  -  Home (comment) TM, bike  Home (comment) TM, bike   Frequency  -  -  -  Add 2 additional days to program exercise sessions.  Add 2 additional days to program exercise sessions.   Initial Home Exercises Provided  -  -  -  04/25/17  04/25/17   Row Name 05/11/17 1500             Response to Exercise   Blood Pressure (Admit)  138/70       Blood Pressure (Exercise)  194/78       Blood Pressure (Exit)  124/64       Heart Rate (Admit)  92 bpm       Heart Rate (Exercise)  117 bpm       Heart Rate (Exit)  93 bpm       Rating of Perceived Exertion (Exercise)  13       Symptoms  none       Intensity  THRR unchanged         Progression   Progression  Continue to progress workloads to maintain intensity without signs/symptoms of physical distress.       Average METs  2.8         Resistance Training   Training Prescription  Yes       Weight  3 lb       Reps  10-15         Treadmill   MPH  2       Grade  1       Minutes  15       METs  2.8         NuStep   Level  7       SPM  80       Minutes  15          Exercise Comments: Exercise Comments    Row Name 03/24/17 1714 04/25/17 1739 05/23/17 1635       Exercise Comments  First full day of exercise!  Patient was oriented to gym and equipment including functions, settings, policies, and procedures.  Patient's individual exercise prescription and treatment plan were reviewed.  All starting workloads were established based on the results of the 6 minute walk test done  at initial orientation visit.  The plan for exercise progression was also introduced and progression will be customized based on patient's performance and goals.  Reviewed home exercise with pt today.  Pt plans to use bike and TM at home for exercise.  Reviewed THR, pulse, RPE, sign and symptoms, NTG use, and when to call 911 or MD.  Also discussed weather considerations and indoor options.  Pt voiced understanding.  -        Exercise Goals and Review: Exercise Goals    Row Name 03/14/17 1443  Exercise Goals   Increase Physical Activity  Yes       Intervention  Provide advice, education, support and counseling about physical activity/exercise needs.;Develop an individualized exercise prescription for aerobic and resistive training based on initial evaluation findings, risk stratification, comorbidities and participant's personal goals.       Expected Outcomes  Short Term: Attend rehab on a regular basis to increase amount of physical activity.;Long Term: Add in home exercise to make exercise part of routine and to increase amount of physical activity.;Long Term: Exercising regularly at least 3-5 days a week.       Increase Strength and Stamina  Yes       Intervention  Provide advice, education, support and counseling about physical activity/exercise needs.;Develop an individualized exercise prescription for aerobic and resistive training based on initial evaluation findings, risk stratification, comorbidities and participant's personal goals.       Expected Outcomes  Short Term: Increase workloads from initial exercise prescription for resistance, speed, and METs.;Short Term: Perform resistance training exercises routinely during rehab and add in resistance training at home;Long Term: Improve cardiorespiratory fitness, muscular endurance and strength as measured by increased METs and functional capacity (6MWT)       Able to understand and use rate of perceived exertion (RPE) scale  Yes        Intervention  Provide education and explanation on how to use RPE scale       Expected Outcomes  Short Term: Able to use RPE daily in rehab to express subjective intensity level;Long Term:  Able to use RPE to guide intensity level when exercising independently       Able to understand and use Dyspnea scale  Yes       Intervention  Provide education and explanation on how to use Dyspnea scale       Expected Outcomes  Short Term: Able to use Dyspnea scale daily in rehab to express subjective sense of shortness of breath during exertion;Long Term: Able to use Dyspnea scale to guide intensity level when exercising independently       Knowledge and understanding of Target Heart Rate Range (THRR)  Yes       Intervention  Provide education and explanation of THRR including how the numbers were predicted and where they are located for reference       Expected Outcomes  Short Term: Able to state/look up THRR;Long Term: Able to use THRR to govern intensity when exercising independently;Short Term: Able to use daily as guideline for intensity in rehab       Able to check pulse independently  Yes       Intervention  Provide education and demonstration on how to check pulse in carotid and radial arteries.;Review the importance of being able to check your own pulse for safety during independent exercise       Expected Outcomes  Short Term: Able to explain why pulse checking is important during independent exercise;Long Term: Able to check pulse independently and accurately       Understanding of Exercise Prescription  Yes       Intervention  Provide education, explanation, and written materials on patient's individual exercise prescription       Expected Outcomes  Short Term: Able to explain program exercise prescription;Long Term: Able to explain home exercise prescription to exercise independently          Exercise Goals Re-Evaluation : Exercise Goals Re-Evaluation    Row Name 03/24/17 1714 03/30/17  1432 04/13/17 1527  04/18/17 1644 04/25/17 1739     Exercise Goal Re-Evaluation   Exercise Goals Review  Understanding of Exercise Prescription;Knowledge and understanding of Target Heart Rate Range (THRR);Able to understand and use rate of perceived exertion (RPE) scale  Increase Physical Activity;Increase Strength and Stamina;Able to understand and use Dyspnea scale;Able to understand and use rate of perceived exertion (RPE) scale  Increase Physical Activity;Increase Strength and Stamina;Able to understand and use rate of perceived exertion (RPE) scale  Increase Physical Activity;Increase Strength and Stamina  Increase Physical Activity;Increase Strength and Stamina;Knowledge and understanding of Target Heart Rate Range (THRR);Able to understand and use rate of perceived exertion (RPE) scale;Able to check pulse independently;Understanding of Exercise Prescription   Comments  Reviewed RPE scale, THR and program prescription with pt today.  Pt voiced understanding and was given a copy of goals to take home.   Pt is tolerating exercise well.    Huntley continues to tolerate exercise well. Staff will continue to monitor progress  Khoa states that he can walk further before resting now that he has been more consistant with his exercise.   Reviewed home exercise with pt today.  Pt plans to use bike and TM at home for exercise.  Reviewed THR, pulse, RPE, sign and symptoms, NTG use, and when to call 911 or MD.  Also discussed weather considerations and indoor options.  Pt voiced understanding.   Expected Outcomes  Short: Use RPE daily to regulate intensity.  Long: Follow program prescription in THR.   Short - Pt will continue to attend regularly Long - Pt will improve overall MET level  Short - Pt will attend classes regularly Long - Pt will maintain independent exercise  -  Short - Chike will clean the clothes off his TM so he can use it Long - Devarion will exrecise on his own   Row Name 05/11/17 1547              Exercise Goal Re-Evaluation   Exercise Goals Review  Increase Physical Activity;Increase Strength and Stamina;Able to understand and use rate of perceived exertion (RPE) scale;Knowledge and understanding of Target Heart Rate Range (THRR)       Comments  Anthany tolerates exercise well. Staff will continue to monitor       Expected Outcomes  Short - Blaiden will continue to attend regularly  Long - Ovila will maintain exercise on his own          Discharge Exercise Prescription (Final Exercise Prescription Changes): Exercise Prescription Changes - 05/11/17 1500      Response to Exercise   Blood Pressure (Admit)  138/70    Blood Pressure (Exercise)  194/78    Blood Pressure (Exit)  124/64    Heart Rate (Admit)  92 bpm    Heart Rate (Exercise)  117 bpm    Heart Rate (Exit)  93 bpm    Rating of Perceived Exertion (Exercise)  13    Symptoms  none    Intensity  THRR unchanged      Progression   Progression  Continue to progress workloads to maintain intensity without signs/symptoms of physical distress.    Average METs  2.8      Resistance Training   Training Prescription  Yes    Weight  3 lb    Reps  10-15      Treadmill   MPH  2    Grade  1    Minutes  15    METs  2.8  NuStep   Level  7    SPM  80    Minutes  15       Nutrition:  Target Goals: Understanding of nutrition guidelines, daily intake of sodium '1500mg'$ , cholesterol '200mg'$ , calories 30% from fat and 7% or less from saturated fats, daily to have 5 or more servings of fruits and vegetables.  Biometrics: Pre Biometrics - 03/14/17 1444      Pre Biometrics   Height  5' 8.9" (1.75 m)    Weight  254 lb 14.4 oz (115.6 kg)    Waist Circumference  48 inches    Hip Circumference  48 inches    Waist to Hip Ratio  1 %    BMI (Calculated)  37.75    Single Leg Stand  3.34 seconds      Post Biometrics - 05/19/17 1636       Post  Biometrics   Height  5' 8.9" (1.75 m)    Weight  254 lb (115.2 kg)    Waist  Circumference  46 inches    Hip Circumference  48 inches    Waist to Hip Ratio  0.96 %    BMI (Calculated)  37.62    Single Leg Stand  10 seconds       Nutrition Therapy Plan and Nutrition Goals: Nutrition Therapy & Goals - 03/14/17 1432      Intervention Plan   Intervention  Prescribe, educate and counsel regarding individualized specific dietary modifications aiming towards targeted core components such as weight, hypertension, lipid management, diabetes, heart failure and other comorbidities.;Nutrition handout(s) given to patient.    Expected Outcomes  Short Term Goal: Understand basic principles of dietary content, such as calories, fat, sodium, cholesterol and nutrients.;Short Term Goal: A plan has been developed with personal nutrition goals set during dietitian appointment.;Long Term Goal: Adherence to prescribed nutrition plan.       Nutrition Assessments: Nutrition Assessments - 05/16/17 1630      MEDFICTS Scores   Post Score  38       Nutrition Goals Re-Evaluation: Nutrition Goals Re-Evaluation    Row Name 04/18/17 1642 05/11/17 1554           Goals   Current Weight  -  256 lb (116.1 kg)      Nutrition Goal  Jacorion does not want to meet with the dietician in the cardiac rehab program. He states that he meets with a dietician at the [place] 2x a year and is comfortable following the nutician advise he gets from that dietician.   Steffen said he knows what to eat and has met with the [facility]. He feels he doesn't need to meet with our dietician now.       Comment  -  Zyiere said that he "all my problems are due to [place] in [place]" including my diabetes but reports his blood sugars upon awaking are 130 and before supper are 125-130.        Expected Outcome  -  Heart healthy eating          Nutrition Goals Discharge (Final Nutrition Goals Re-Evaluation): Nutrition Goals Re-Evaluation - 05/11/17 1554      Goals   Current Weight  256 lb (116.1 kg)     Nutrition Goal  Merit said he knows what to eat and has met with the [place]. He feels he doesn't need to meet with our dietician now.     Comment  Larico said that  he "all my problems are due to [place] in [place]" including my diabetes but reports his blood sugars upon awaking are 130 and before supper are 125-130.      Expected Outcome  Heart healthy eating        Psychosocial: Target Goals: Acknowledge presence or absence of significant depression and/or stress, maximize coping skills, provide positive support system. Participant is able to verbalize types and ability to use techniques and skills needed for reducing stress and depression.   Initial Review & Psychosocial Screening: Initial Psych Review & Screening - 03/14/17 1433      Initial Review   Current issues with  Current Sleep Concerns He and his wife have been having issues sleeping for the last 4 months      [place]?  Yes      Screening Interventions   Interventions  Encouraged to exercise;Program counselor consult    Expected Outcomes  Short Term goal: Utilizing psychosocial counselor, staff and physician to assist with identification of specific Stressors or current issues interfering with healing process. Setting desired goal for each stressor or current issue identified.;Long Term Goal: Stressors or current issues are controlled or eliminated.;Short Term goal: Identification and review with participant of any Quality of Life or Depression concerns found by scoring the questionnaire.;Long Term goal: The participant improves quality of Life and PHQ9 Scores as seen by post scores and/or verbalization of changes       Quality of Life Scores:  Quality of Life - 05/16/17 1629      Quality of Life Scores   Health/Function Post  24.6 %    Socioeconomic Post  29.06 %    Psych/Spiritual Post  28.93 %    Family Post  30 %    GLOBAL Post  27.26 %      Scores of 19 and below  usually indicate a poorer quality of life in these areas.  A difference of  2-3 points is a clinically meaningful difference.  A difference of 2-3 points in the total score of the Quality of Life Index has been associated with significant improvement in overall quality of life, self-image, physical symptoms, and general health in studies assessing change in quality of life.  PHQ-9: Recent Review Flowsheet Data    Depression screen [facility] 2/9 05/16/2017 03/14/2017 07/04/2015 06/27/2015 02/18/2015   Decreased Interest 0 0 0 0 0   Down, Depressed, Hopeless 0 0 0 0 0   PHQ - 2 Score 0 0 0 0 0   Altered sleeping 1 1 0 0 -   Tired, decreased energy '1 2 1 1 '$ -   Change in appetite 0 0 0 0 -   Feeling bad or failure about yourself  0 0 1 1 -   Trouble concentrating 0 0 0 0 -   Moving slowly or fidgety/restless 0 0 0 0 -   Suicidal thoughts 0 0 0 0 -   PHQ-9 Score '2 3 2 2 '$ -   Difficult doing work/chores Somewhat difficult Not difficult at all Not difficult at all Not difficult at all -     Interpretation of Total Score  Total Score Depression Severity:  1-4 = Minimal depression, 5-9 = Mild depression, 10-14 = Moderate depression, 15-19 = Moderately severe depression, 20-27 = Severe depression   Psychosocial Evaluation and Intervention: Psychosocial Evaluation - 03/28/17 1646      Psychosocial Evaluation & Interventions   Comments  Counselor met with Mr. Cudd (  Simeon Craft) today for psychosocial evaluation for Cardiac Rehab.  He was in our Pulmonary Rehab program several years ago.  He continues to have a strong suport system with a spouse of 30 years and (2) daughters who live locally.  Daquan has diabetes and sleep apnea, as well as COPD and heart disease.  He reports sleeping well with the use of a CPAP.  He has a good appetite and has goals to lose some weight while in this program.  Davius reports a history of some anxiety in the past and has been on medication for this for approximately 10 years.  He states  this is working well for him and treats his current symptoms.  He states his stress is minimal other than his chronic health conditions.  He has goals to increase his stamina and strength as well as lose weight.  He has gym equipment to use at home once he completes this program.  Sophie will be followed by staff while here.      Expected Outcomes  Zyen will benefit from consistent exercise to achieve his states goals.  The educational and psychoeducational components of this program will be helpful in understanding his medical condition and coping more positively.  Jerald will meet with the dietician to address his weight loss goals.      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     Expected Outcomes  Keep stress low by fishing    Interventions  Encouraged to attend Cardiac Rehabilitation for the exercise       Vocational Rehabilitation: Provide vocational rehab assistance to qualifying candidates.   Vocational Rehab Evaluation & Intervention: Vocational Rehab - 03/14/17 1435      Initial Vocational Rehab Evaluation & Intervention   Assessment shows need for Vocational Rehabilitation  No       Education: Education Goals: Education classes will be provided on a variety of  topics geared toward better understanding of heart health and risk factor modification. Participant will state understanding/return demonstration of topics presented as noted by education test scores.  Learning Barriers/Preferences: Learning Barriers/Preferences - 03/14/17 1434      Learning Barriers/Preferences   Learning Barriers  Hearing;Exercise Concerns    Learning Preferences  None       Education Topics:  AED/CPR: - Group verbal and written instruction with the use of models to demonstrate the basic use of the AED with the basic ABC's of resuscitation.   Cardiac Rehab from 05/23/2017 in [facility] Cardiac and Pulmonary Rehab  Date  04/04/17  Educator  MA  Instruction Review Code  1- Verbalizes Understanding  General Nutrition Guidelines/Fats and Fiber: -Group instruction provided by verbal, written material, models and posters to present the general guidelines for heart healthy nutrition. Gives an explanation and review of dietary fats and fiber.   Cardiac Rehab from 05/23/2017 in [facility] Cardiac and Pulmonary Rehab  Date  05/16/17  Educator  PI  Instruction Review Code  1- Verbalizes Understanding      Controlling Sodium/Reading Food Labels: -Group verbal and written material supporting the discussion of sodium use in heart healthy nutrition. Review and explanation with models, verbal and written materials for utilization of the food label.   Cardiac Rehab from 05/23/2017 in [facility] Cardiac and Pulmonary Rehab  Date  05/23/17  Educator  PI  Instruction Review Code  1- Verbalizes Understanding      Exercise Physiology & General Exercise Guidelines: - Group verbal and written instruction with models to review the exercise physiology of the cardiovascular system and associated critical values. Provides general exercise guidelines with specific guidelines to those with heart or lung disease.    Cardiac Rehab from 05/23/2017 in [facility] Cardiac and Pulmonary Rehab  Date  04/11/17  Educator   [facility]  Instruction Review Code  1- Verbalizes Understanding      Aerobic Exercise & Resistance Training: - Gives group verbal and written instruction on the various components of exercise. Focuses on aerobic and resistive training programs and the benefits of this training and how to safely progress through these programs..   Cardiac Rehab from 05/23/2017 in [facility] Cardiac and Pulmonary Rehab  Date  04/18/17  Educator  AS  Instruction Review Code  1- Verbalizes Understanding      Flexibility, Balance, Mind/Body Relaxation: Provides group verbal/written instruction on the benefits of flexibility and balance training, including mind/body exercise modes such as yoga, pilates and tai chi.  Demonstration and skill practice provided.   Cardiac Rehab from 05/23/2017 in [facility] Cardiac and Pulmonary Rehab  Date  04/20/17  Educator  AS  Instruction Review Code  1- Verbalizes Understanding      Stress and Anxiety: - Provides group verbal and written instruction about the health risks of elevated stress and causes of high stress.  Discuss the correlation between heart/lung disease and anxiety and treatment options. Review healthy ways to manage with stress and anxiety.   Cardiac Rehab from 05/23/2017 in [facility] Cardiac and Pulmonary Rehab  Date  04/27/17  Educator  [facility]  Instruction Review Code  1- Verbalizes Understanding      Depression: - Provides group verbal and written instruction on the correlation between heart/lung disease and depressed mood, treatment options, and the stigmas associated with seeking treatment.   Cardiac Rehab from 05/23/2017 in [facility] Cardiac and Pulmonary Rehab  Date  04/13/17  Educator  [facility]  Instruction Review Code  1- Verbalizes Understanding      Anatomy & Physiology of the Heart: - Group verbal and written instruction and models provide basic cardiac anatomy and physiology, with the coronary electrical and arterial systems. Review of Valvular disease and Heart Failure    Cardiac Rehab from 05/23/2017 in [facility] Cardiac and Pulmonary Rehab  Date  05/09/17  Educator  CE  Instruction Review Code  1- Verbalizes Understanding      Cardiac Procedures: - Group verbal and written instruction to review commonly prescribed medications for heart disease. Reviews the medication, class of the drug, and side effects. Includes the steps to properly store meds and maintain the prescription regimen. (beta blockers and nitrates)   Cardiac Rehab from 05/23/2017 in  [place] Cardiac and Pulmonary Rehab  Date  05/18/17  Educator  [facility]  Instruction Review Code  1- Verbalizes Understanding      Cardiac Medications I: - Group verbal and written instruction to review commonly prescribed medications for heart disease. Reviews the medication, class of the drug, and side effects. Includes the steps to properly store meds and maintain the prescription regimen.   Cardiac Rehab from 05/23/2017 in [facility] Cardiac and Pulmonary Rehab  Date  05/02/17  Educator  CE  Instruction Review Code  1- Verbalizes Understanding      Cardiac Medications II: -Group verbal and written instruction to review commonly prescribed medications for heart disease. Reviews the medication, class of the drug, and side effects. (all other drug classes)   Cardiac Rehab from 05/23/2017 in [place] Cardiac and Pulmonary Rehab  Date  04/25/17  Educator  CE  Instruction Review Code  1- Verbalizes Understanding       Go Sex-Intimacy & Heart Disease, Get SMART - Goal Setting: - Group verbal and written instruction through game format to discuss heart disease and the return to sexual intimacy. Provides group verbal and written material to discuss and apply goal setting through the application of the S.M.A.R.T. Method.   Cardiac Rehab from 05/23/2017 in [facility] Cardiac and Pulmonary Rehab  Date  05/18/17  Educator  [facility]  Instruction Review Code  1- Verbalizes Understanding      Other Matters of the Heart: - Provides group verbal, written  materials and models to describe Stable Angina and Peripheral Artery. Includes description of the disease process and treatment options available to the cardiac patient.   Exercise & Equipment Safety: - Individual verbal instruction and demonstration of equipment use and safety with use of the equipment.   Cardiac Rehab from 05/23/2017 in [facility] Cardiac and Pulmonary Rehab  Date  03/14/17  Educator  [facility]  Instruction Review Code  1- Verbalizes Understanding      Infection Prevention: - Provides verbal and written material to individual with discussion of infection control including proper hand washing and proper equipment cleaning during exercise session.   Cardiac Rehab from 05/23/2017 in [facility] Cardiac and Pulmonary Rehab  Date  03/14/17  Educator  [facility]  Instruction Review Code  1- Verbalizes Understanding      Falls Prevention: - Provides verbal and written material to individual with discussion of falls prevention and safety.   Cardiac Rehab from 05/23/2017 in [facility] Cardiac and Pulmonary Rehab  Date  03/14/17  Educator  [facility]  Instruction Review Code  1- Verbalizes Understanding      Diabetes: - Individual verbal and written instruction to review signs/symptoms of diabetes, desired ranges of glucose level fasting, after meals and with exercise. Acknowledge that pre and post exercise glucose checks will be done for 3 sessions at entry of program.   Cardiac Rehab from 05/23/2017 in [facility] Cardiac and Pulmonary Rehab  Date  03/14/17 [05/11/2016 Sleep lecture done by Lucianne Lei, MSW]  Educator  [facility]  Instruction Review Code  1- Verbalizes Understanding      Know Your Numbers and Risk Factors: -Group verbal and written instruction about important numbers in your health.  Discussion of what are risk factors and how they play a role in the disease process.  Review of Cholesterol, Blood Pressure, Diabetes, and BMI and the role they play in your overall health.   Cardiac Rehab from 05/23/2017 in [facility]  Cardiac and Pulmonary Rehab  Date  04/25/17  Educator  CE  Instruction Review  Code  1- Verbalizes Understanding      Sleep Hygiene: -Provides group verbal and written instruction about how sleep can affect your health.  Define sleep hygiene, discuss sleep cycles and impact of sleep habits. Review good sleep hygiene tips.    Cardiac Rehab from 05/23/2017 in [facility] Cardiac and Pulmonary Rehab  Date  05/11/17  Educator  [facility]  Instruction Review Code  1- Verbalizes Understanding      Other: -Provides group and verbal instruction on various topics (see comments)   Knowledge Questionnaire Score: Knowledge Questionnaire Score - 05/16/17 1629      Knowledge Questionnaire Score   Post Score  27/28       Core Components/Risk Factors/Patient Goals at Admission: Personal Goals and Risk Factors at Admission - 03/14/17 1423      Core Components/Risk Factors/Patient Goals on Admission    Weight Management  Yes;Obesity;Weight Loss    Intervention  Weight Management: Develop a combined nutrition and exercise program designed to reach desired caloric intake, while maintaining appropriate intake of nutrient and fiber, sodium and fats, and appropriate energy expenditure required for the weight goal.;Weight Management: Provide education and appropriate resources to help participant work on and attain dietary goals.;Weight Management/Obesity: Establish reasonable short term and long term weight goals.;Obesity: Provide education and appropriate resources to help participant work on and attain dietary goals.    Admit Weight  254 lb 14.4 oz (115.6 kg)    Goal Weight: Short Term  250 lb (113.4 kg)    Goal Weight: Long Term  210 lb (95.3 kg)    Expected Outcomes  Short Term: Continue to assess and modify interventions until short term weight is achieved;Long Term: Adherence to nutrition and physical activity/exercise program aimed toward attainment of established weight goal;Weight Loss: Understanding of general  recommendations for a balanced deficit meal plan, which promotes 1-2 lb weight loss per week and includes a negative energy balance of [phone] kcal/d;Understanding of distribution of calorie intake throughout the day with the consumption of 4-5 meals/snacks;Understanding recommendations for meals to include 15-35% energy as protein, 25-35% energy from fat, 35-60% energy from carbohydrates, less than '200mg'$  of dietary cholesterol, 20-35 gm of total fiber daily    Tobacco Cessation  Yes    Number of packs per day  1 cig each morning    Intervention  Assist the participant in steps to quit. Provide individualized education and counseling about committing to Tobacco Cessation, relapse prevention, and pharmacological support that can be provided by physician.;Advice worker, assist with locating and accessing local/national Quit Smoking programs, and support quit date choice.    Expected Outcomes  Short Term: Will demonstrate readiness to quit, by selecting a quit date.;Long Term: Complete abstinence from all tobacco products for at least 12 months from quit date.;Short Term: Will quit all tobacco product use, adhering to prevention of relapse plan.    Diabetes  Yes    Intervention  Provide education about signs/symptoms and action to take for hypo/hyperglycemia.;Provide education about proper nutrition, including hydration, and aerobic/resistive exercise prescription along with prescribed medications to achieve blood glucose in normal ranges: Fasting glucose 65-99 mg/dL    Expected Outcomes  Short Term: Participant verbalizes understanding of the signs/symptoms and immediate care of hyper/hypoglycemia, proper foot care and importance of medication, aerobic/resistive exercise and nutrition plan for blood glucose control.;Long Term: Attainment of HbA1C < 7%.    Hypertension  Yes    Intervention  Provide education on lifestyle modifcations including regular physical activity/exercise, weight  management,  moderate sodium restriction and increased consumption of fresh fruit, vegetables, and low fat dairy, alcohol moderation, and smoking cessation.;Monitor prescription use compliance.    Expected Outcomes  Short Term: Continued assessment and intervention until BP is < 140/30m HG in hypertensive participants. < 130/837mHG in hypertensive participants with diabetes, heart failure or chronic kidney disease.;Long Term: Maintenance of blood pressure at goal levels.    Lipids  Yes    Intervention  Provide education and support for participant on nutrition & aerobic/resistive exercise along with prescribed medications to achieve LDL '70mg'$ , HDL >'40mg'$ .    Expected Outcomes  Short Term: Participant states understanding of desired cholesterol values and is compliant with medications prescribed. Participant is following exercise prescription and nutrition guidelines.;Long Term: Cholesterol controlled with medications as prescribed, with individualized exercise RX and with personalized nutrition plan. Value goals: LDL < '70mg'$ , HDL > 40 mg.       Core Components/Risk Factors/Patient Goals Review:  Goals and Risk Factor Review    Row Name 04/18/17 1638 05/11/17 1621 05/11/17 1622         Core Components/Risk Factors/Patient Goals Review   Personal Goals Review  Weight Management/Obesity;Lipids;Hypertension;Diabetes;Improve shortness of breath with ADL's  -  Weight Management/Obesity;Hypertension;Diabetes     Review  LePreciousas not lost weight yet but we discussed healthy weight loss priniples and the difference between fat weight and muscle weight. He is taking all cardiac meds as prescribed to manage his diabetes, lipids, and blood pressure. He is attending cardiac rehab on a regular basis and meets with a dietician from the [facility] [place].   WEight is 250  weight is 256lbs today not 250lbs . Blood sugars before meals [employer] that they are 125-130. His blood pressure today was 150/64 but that was when  he first arrived to Cardiac REhab.      Expected Outcomes  Short: Weight goal 250 lbs Long: 210 lbs.   -  STill short term goal of 250lbs. Long term even 240lbs at this point.         Core Components/Risk Factors/Patient Goals at Discharge (Final Review):  Goals and Risk Factor Review - 05/11/17 1622      Core Components/Risk Factors/Patient Goals Review   Personal Goals Review  Weight Management/Obesity;Hypertension;Diabetes    Review  weight is 256lbs today not 250lbs . Blood sugars before meals [employer] that they are 125-130. His blood pressure today was 150/64 but that was when he first arrived to Cardiac REhab.     Expected Outcomes  STill short term goal of 250lbs. Long term even 240lbs at this point.        ITP Comments: ITP Comments    Row Name 03/14/17 1400 03/23/17 0645 03/31/17 1409 04/20/17 0611 05/18/17 0654   ITP Comments  Med review completed. Initial ITP created. Diagnosis can be found in Media Tab from [facility] [place]  30 Day review. Continue with ITP unless directed changes per Medical Director review.   New to program  LeJonathandavidalled to say he is sorry that he can not attend Cardiac Rehab today but he didn't sleep well due to coughing and sneezing. I suggested that he call his MD also.   30 day review. Continue with ITP unless directed changes per Medical Director review.  30 Day review. Continue with ITP unless directed changes per Medical Director review.        Comments:

## 2018-03-09 ENCOUNTER — Other Ambulatory Visit: Payer: Self-pay

## 2018-03-09 DIAGNOSIS — Z1211 Encounter for screening for malignant neoplasm of colon: Secondary | ICD-10-CM

## 2018-03-10 ENCOUNTER — Telehealth: Payer: Self-pay | Admitting: Gastroenterology

## 2018-03-10 NOTE — Telephone Encounter (Signed)
Pt wife is calling to inform Jeffrey Burgess of the [place] prep they are giving the pt its called MoviPrep please call her to inform her if that is ok

## 2018-03-10 NOTE — Telephone Encounter (Signed)
Patients wife has been advised that her husband may use the Movi Prep Bowel Prep.  Advised her to have her husband drink 8oz starting at 5pm the evening before procedure every 30 minutes.  Call office back if she has any questions.  Thanks [employer]

## 2018-04-03 ENCOUNTER — Ambulatory Visit
Admission: RE | Admit: 2018-04-03 | Discharge: 2018-04-03 | Disposition: A | Discharge status: Home / Self Care | Payer: No Typology Code available for payment source | Attending: Gastroenterology | Admitting: Gastroenterology

## 2018-04-03 ENCOUNTER — Encounter
Admission: RE | Disposition: A | Discharge status: Home / Self Care | Payer: Self-pay | Source: Home / Self Care | Attending: Gastroenterology

## 2018-04-03 ENCOUNTER — Ambulatory Visit: Payer: No Typology Code available for payment source | Admitting: Anesthesiology

## 2018-04-03 ENCOUNTER — Encounter: Payer: Self-pay | Admitting: *Deleted

## 2018-04-03 DIAGNOSIS — J449 Chronic obstructive pulmonary disease, unspecified: Secondary | ICD-10-CM | POA: Diagnosis not present

## 2018-04-03 DIAGNOSIS — I10 Essential (primary) hypertension: Secondary | ICD-10-CM | POA: Insufficient documentation

## 2018-04-03 DIAGNOSIS — Z7951 Long term (current) use of inhaled steroids: Secondary | ICD-10-CM | POA: Diagnosis not present

## 2018-04-03 DIAGNOSIS — I252 Old myocardial infarction: Secondary | ICD-10-CM | POA: Diagnosis not present

## 2018-04-03 DIAGNOSIS — E119 Type 2 diabetes mellitus without complications: Secondary | ICD-10-CM | POA: Insufficient documentation

## 2018-04-03 DIAGNOSIS — Z794 Long term (current) use of insulin: Secondary | ICD-10-CM | POA: Diagnosis not present

## 2018-04-03 DIAGNOSIS — G4730 Sleep apnea, unspecified: Secondary | ICD-10-CM | POA: Diagnosis not present

## 2018-04-03 DIAGNOSIS — Z79899 Other long term (current) drug therapy: Secondary | ICD-10-CM | POA: Insufficient documentation

## 2018-04-03 DIAGNOSIS — M069 Rheumatoid arthritis, unspecified: Secondary | ICD-10-CM | POA: Insufficient documentation

## 2018-04-03 DIAGNOSIS — F17210 Nicotine dependence, cigarettes, uncomplicated: Secondary | ICD-10-CM | POA: Insufficient documentation

## 2018-04-03 DIAGNOSIS — Z1211 Encounter for screening for malignant neoplasm of colon: Secondary | ICD-10-CM | POA: Insufficient documentation

## 2018-04-03 DIAGNOSIS — E785 Hyperlipidemia, unspecified: Secondary | ICD-10-CM | POA: Insufficient documentation

## 2018-04-03 DIAGNOSIS — K621 Rectal polyp: Secondary | ICD-10-CM | POA: Diagnosis not present

## 2018-04-03 DIAGNOSIS — Z7982 Long term (current) use of aspirin: Secondary | ICD-10-CM | POA: Diagnosis not present

## 2018-04-03 HISTORY — DX: Chronic obstructive pulmonary disease, unspecified: J44.9

## 2018-04-03 HISTORY — PX: COLONOSCOPY WITH PROPOFOL: SHX5780

## 2018-04-03 HISTORY — DX: Dyspnea, unspecified: R06.00

## 2018-04-03 LAB — GLUCOSE, CAPILLARY: Glucose-Capillary: 123 mg/dL — ABNORMAL HIGH (ref 70–99)

## 2018-04-03 SURGERY — COLONOSCOPY WITH PROPOFOL
Anesthesia: General

## 2018-04-03 MED ORDER — LIDOCAINE HCL (CARDIAC) PF 100 MG/5ML IV SOSY
PREFILLED_SYRINGE | INTRAVENOUS | Status: DC | PRN
  Administered 2018-04-03: 20 mg via INTRAVENOUS

## 2018-04-03 MED ORDER — PROPOFOL 500 MG/50ML IV EMUL
INTRAVENOUS | Status: DC | PRN
  Administered 2018-04-03: 125 ug/kg/min via INTRAVENOUS

## 2018-04-03 MED ORDER — SODIUM CHLORIDE 0.9 % IV SOLN
INTRAVENOUS | Status: DC
  Administered 2018-04-03: 1000 mL via INTRAVENOUS

## 2018-04-03 MED ORDER — PROPOFOL 10 MG/ML IV BOLUS
INTRAVENOUS | Status: DC | PRN
  Administered 2018-04-03: 50 mg via INTRAVENOUS

## 2018-04-03 MED ORDER — PROPOFOL 500 MG/50ML IV EMUL
INTRAVENOUS | Status: AC
  Filled 2018-04-03: qty 50

## 2018-04-03 NOTE — H&P (Signed)
Wyline Mood, MD [address], [place], [place], [address] [address], [address], [place], [place], [address] Phone: [phone]  Fax: [phone]  Primary Care Physician:  System, Pcp Not In   Pre-Procedure History & Physical: HPI:  Jeffrey Burgess is a 73 y.o. male is here for an colonoscopy.   Past Medical History:  Diagnosis Date  . Asthma   . COPD (chronic obstructive pulmonary disease) (HCC)   . Diabetes (HCC)   . Dyspnea   . Hyperlipidemia   . Hypertension   . Myocardial infarction (HCC)   . Rheumatoid arthritis (HCC)   . Sleep apnea     Past Surgical History:  Procedure Laterality Date  . BACK SURGERY    . CARDIAC CATHETERIZATION    . ELBOW ARTHROCENTESIS    . ROTATOR CUFF REPAIR Right     Prior to Admission medications   Medication Sig Start Date End Date Taking? Authorizing Provider  acetaminophen (TYLENOL) 500 MG tablet Take 500 mg by mouth.   Yes [provider]  albuterol (PROAIR HFA) 108 (90 Base) MCG/ACT inhaler Inhale 2 puffs into the lungs.   Yes [provider]  aspirin EC 81 MG tablet Take 81 mg by mouth.   Yes [provider]  beclomethasone (BECONASE-AQ) 42 MCG/SPRAY nasal spray Place 2 sprays into the nose.   Yes [provider]  budesonide-formoterol (SYMBICORT) 160-4.5 MCG/ACT inhaler Inhale 2 puffs into the lungs.   Yes [provider]  buPROPion (WELLBUTRIN) 100 MG tablet Take 100 mg by mouth.   Yes [provider]  busPIRone (BUSPAR) 10 MG tablet Take 10 mg by mouth 3 (three) times daily.    Yes [provider]  cetirizine (ZYRTEC) 10 MG tablet Take 10 mg by mouth daily.   Yes [provider]  Cholecalciferol (VITAMIN D3 SUPER STRENGTH) 2000 units TABS Take 2,000 Units by mouth.   Yes [provider]  glucose blood test strip 1 each by Other route as needed for other. Use as instructed   Yes [provider]  insulin aspart protamine -  aspart (NOVOLOG 70/30 MIX) (70-30) 100 UNIT/ML FlexPen Inject 18 Units into the skin.   Yes [provider]  lisinopril (PRINIVIL,ZESTRIL) 10 MG tablet Take 10 mg by mouth daily.   Yes [provider]  metFORMIN (GLUCOPHAGE) 500 MG tablet Take 500 mg by mouth 2 (two) times daily after a meal.    Yes [provider]  metoprolol succinate (TOPROL-XL) 100 MG 24 hr tablet Take 100 mg by mouth daily. Take with or immediately following a meal.   Yes [provider]  montelukast (SINGULAIR) 10 MG tablet Take 10 mg by mouth.   Yes [provider]  nitroGLYCERIN (NITROSTAT) 0.4 MG SL tablet Place 0.4 mg under the tongue.   Yes [provider]  Omega-3 1000 MG CAPS Take 2 g by mouth.   Yes [provider]  pramipexole (MIRAPEX) 0.5 MG tablet Take 0.5 mg by mouth.   Yes [provider]  pregabalin (LYRICA) 100 MG capsule Take 200 mg by mouth every morning.    Yes [provider]  ranitidine (ZANTAC) 150 MG capsule Take 150 mg by mouth 2 (two) times daily.    Yes [provider]  rosuvastatin (CRESTOR) 20 MG tablet Take 20 mg by mouth.   Yes [provider]  tiotropium (SPIRIVA) 18 MCG inhalation capsule Place 18 mcg into inhaler and inhale.   Yes  [provider]  VITAMIN B COMPLEX-C CAPS Take 1 tablet by mouth.   Yes [provider]  vitamin E 400 UNIT capsule Take 400 Units by mouth.   Yes [provider]  benzonatate (TESSALON) 200 MG capsule Take 200 mg by mouth 3 (three) times daily as needed for cough.    [provider]  docusate sodium (STOOL SOFTENER) 100 MG capsule Take 100 mg by mouth.    [provider]  fluticasone (FLONASE) 50 MCG/ACT nasal spray Place 1 spray into the nose.    [provider]  metoprolol tartrate (LOPRESSOR) 25 MG tablet Take 25 mg by mouth.    [provider]  nicotine polacrilex (COMMIT) 2 MG lozenge Place inside  cheek.    [provider]    Allergies as of 03/09/2018 - Review Complete 05/11/2017  Allergen Reaction Noted  . Iodinated diagnostic agents Anaphylaxis and Swelling 02/26/2015  . Sulfamethoxazole Anaphylaxis 02/26/2015    History reviewed. No pertinent family history.  Social History   Socioeconomic History  . Marital status: Married    Spouse name: Not on file  . Number of children: Not on file  . Years of education: Not on file  . Highest education level: Not on file  Occupational History  . Not on file  Social Needs  . Financial resource strain: Not on file  . Food insecurity:    Worry: Not on file    Inability: Not on file  . Transportation needs:    Medical: Not on file    Non-medical: Not on file  Tobacco Use  . Smoking status: Current Every Day Smoker    Packs/day: 0.25    Years: 57.00    Pack years: 14.25    Types: Cigarettes  . Smokeless tobacco: Former [employer]geon[employer]  . Tobacco comment: reports smoking 1 cig each morning.  Substance and Sexual Activity  . Alcohol use: Never    Frequency: Never  . Drug use: Never  . Sexual activity: Not on file  Lifestyle  . Physical activity:    Days per week: Not on file    Minutes per session: Not on file  . Stress: Not on file  Relationships  . Social connections:    Talks on phone: Not on file    Gets together: Not on file    Attends religious service: Not on file    Active member of club or organization: Not on file    Attends meetings of clubs or organizations: Not on file    Relationship status: Not on file  . Intimate partner violence:    Fear of current or ex partner: Not on file    Emotionally abused: Not on file    Physically abused: Not on file    Forced sexual activity: Not on file  Other Topics Concern  . Not on file  Social History Narrative  . Not on file    Review of Systems: See HPI, otherwise negative ROS  Physical Exam: BP 118/64   Pulse 77   Temp (!) 97.1 F (36.2 C) (Tympanic)    Resp 16   Ht 5\' 9"  (1.753 m)   Wt 113.4 kg   BMI 36.92 kg/m  General:   Alert,  pleasant and cooperative in NAD Head:  Normocephalic and atraumatic. Neck:  Supple; no masses or thyromegaly. Lungs:  Clear throughout to auscultation, normal respiratory effort.    Heart:  +S1, +S2, Regular rate and rhythm, No edema. Abdomen:  Soft, nontender  and nondistended. Normal bowel sounds, without guarding, and without rebound.   Neurologic:  Alert and  oriented x4;  grossly normal neurologically.  Impression/Plan: Jeffrey Burgess is here for an colonoscopy to be performed for Screening colonoscopy average risk   Risks, benefits, limitations, and alternatives regarding  colonoscopy have been reviewed with the patient.  Questions have been answered.  All parties agreeable.   Wyline MoodKiran Jaqua Ching, MD  04/03/2018, 11:32 AM

## 2018-04-03 NOTE — Transfer of Care (Signed)
Immediate Anesthesia Transfer of Care Note  Patient: Jeffrey Burgess  Procedure(s) Performed: COLONOSCOPY WITH PROPOFOL (N/A )  Patient Location: PACU and Endoscopy Unit  Anesthesia Type:General  Level of Consciousness: awake, alert  and oriented  Airway & Oxygen Therapy: Patient Spontanous Breathing  Post-op Assessment: Report given to RN and Post -op Vital signs reviewed and stable  Post vital signs: Reviewed and stable  Last Vitals:  Vitals Value Taken Time  BP    Temp 36.2 C 04/03/2018  9:22 AM  Pulse 77 04/03/2018  9:22 AM  Resp 16 04/03/2018  9:22 AM  SpO2 96 % 04/03/2018  9:22 AM  Vitals shown include unvalidated device data.  Last Pain:  Vitals:   04/03/18 0922  TempSrc: Tympanic  PainSc: 0-No pain      Patients Stated Pain Goal: 0 (04/03/18 0820)  Complications: No apparent anesthesia complications

## 2018-04-03 NOTE — Anesthesia Preprocedure Evaluation (Signed)
Anesthesia Evaluation  Patient identified by MRN, date of birth, ID band Patient awake    Reviewed: Allergy & Precautions, H&P , NPO status , Patient's Chart, lab work & pertinent test results, reviewed documented beta blocker date and time   History of Anesthesia Complications Negative for: history of anesthetic complications  Airway Mallampati: III  TM Distance: >3 FB Neck ROM: full    Dental  (+) Edentulous Upper, Edentulous Lower, Upper Dentures, Lower Dentures, Dental Advidsory Given   Pulmonary shortness of breath and with exertion, asthma , sleep apnea and Continuous Positive Airway Pressure Ventilation , COPD,  COPD inhaler, neg recent URI, Current Smoker,           Cardiovascular Exercise Tolerance: Good hypertension, (-) angina+ CAD and + Past MI  (-) Cardiac Stents and (-) CABG (-) dysrhythmias (-) Valvular Problems/Murmurs     Neuro/Psych negative neurological ROS  negative psych ROS   GI/Hepatic Neg liver ROS, GERD  ,  Endo/Other  diabetes  Renal/GU negative Renal ROS  negative genitourinary   Musculoskeletal   Abdominal   Peds  Hematology negative hematology ROS (+)   Anesthesia Other Findings Past Medical History: No date: Asthma No date: COPD (chronic obstructive pulmonary disease) (HCC) No date: Diabetes (HCC) No date: Dyspnea No date: Hyperlipidemia No date: Hypertension No date: Myocardial infarction (HCC) No date: Rheumatoid arthritis (HCC) No date: Sleep apnea   Reproductive/Obstetrics negative OB ROS                             Anesthesia Physical Anesthesia Plan  ASA: III  Anesthesia Plan: General   Post-op Pain Management:    Induction: Intravenous  PONV Risk Score and Plan: 1 and Propofol infusion and TIVA  Airway Management Planned: Natural Airway and Nasal Cannula  Additional Equipment:   Intra-op Plan:   Post-operative Plan:   Informed  Consent: I have reviewed the patients History and Physical, chart, labs and discussed the procedure including the risks, benefits and alternatives for the proposed anesthesia with the patient or authorized representative who has indicated his/her understanding and acceptance.     Dental Advisory Given  Plan Discussed with: Anesthesiologist, CRNA and Surgeon  Anesthesia Plan Comments:         Anesthesia Quick Evaluation

## 2018-04-03 NOTE — Addendum Note (Signed)
Addendum  created 04/03/18 1200 by Jacobey Gura, CRNA   Intraprocedure Meds edited    

## 2018-04-03 NOTE — Op Note (Signed)
[facility] Gastroenterology Patient Name: [facility] Procedure Date: 04/03/2018 8:32 AM MRN: [id number] Account #: [id number] Date of Birth: [birth year] Admit Type: Outpatient Age: 73 [facility] Gender: Male Note Status: Finalized Procedure:            Colonoscopy Indications:          Screening for colorectal malignant neoplasm Providers:            Wyline MoodKiran Darlisa Spruiell MD, MD Medicines:            Monitored Anesthesia Care Complications:        No immediate complications. Procedure:            Pre-Anesthesia Assessment:                       - Prior to the procedure, a History and Physical was                        performed, and patient medications, allergies and                        sensitivities were reviewed. The patient's tolerance of                        previous anesthesia was reviewed.                       - The risks and benefits of the procedure and the                        sedation options and risks were discussed with the                        patient. All questions were answered and informed                        consent was obtained.                       - ASA Grade Assessment: II - A patient with mild                        systemic disease.                       After obtaining informed consent, the colonoscope was                        passed under direct vision. Throughout the procedure,                        the patient's blood pressure, pulse, and oxygen                        saturations were monitored continuously. The                        Colonoscope was introduced through the anus and                        advanced to the the cecum, identified by the  appendiceal orifice, IC valve and transillumination.                        The colonoscopy was performed with ease. The patient                        tolerated the procedure well. The quality of the bowel                        preparation was  good. Findings:      The perianal and digital rectal examinations were normal.      A 4 mm polyp was found in the rectum. The polyp was sessile. The polyp       was removed with a cold snare. Resection and retrieval were complete.      The exam was otherwise without abnormality on direct and retroflexion       views. Impression:           - One 4 mm polyp in the rectum, removed with a cold                        snare. Resected and retrieved.                       - The examination was otherwise normal on direct and                        retroflexion views. Recommendation:       - Discharge patient to home (with escort).                       - Resume previous diet.                       - Continue present medications.                       - Await pathology results.                       - Repeat colonoscopy in 5-10 years for surveillance                        based on pathology results. Procedure Code(s):    --- Professional ---                       [phone], Colonoscopy, flexible; with removal of tumor(s),                        polyp(s), or other lesion(s) by snare technique Diagnosis Code(s):    --- Professional ---                       Z12.11, Encounter for screening for malignant neoplasm                        of colon                       K62.1, Rectal polyp CPT copyright 2018 American Medical Association. All rights reserved. The codes documented in this report are preliminary and upon coder review may  be revised to meet current compliance requirements. Wyline Mood, MD Wyline Mood MD, MD 04/03/2018 9:20:10 AM This report has been signed electronically. Number of Addenda: 0 Note Initiated On: 04/03/2018 8:32 AM Scope Withdrawal Time: 0 hours 13 minutes 7 seconds  Total Procedure Duration: 0 hours 15 minutes 41 seconds       [facility]

## 2018-04-03 NOTE — Anesthesia Post-op Follow-up Note (Signed)
Anesthesia QCDR form completed.        

## 2018-04-03 NOTE — Anesthesia Postprocedure Evaluation (Signed)
Anesthesia Post Note  Patient: Jeffrey Burgess  Procedure(s) Performed: COLONOSCOPY WITH PROPOFOL (N/A )  Patient location during evaluation: Endoscopy Anesthesia Type: General Level of consciousness: awake and alert Pain management: pain level controlled Vital Signs Assessment: post-procedure vital signs reviewed and stable Respiratory status: spontaneous breathing, nonlabored ventilation, respiratory function stable and patient connected to nasal cannula oxygen Cardiovascular status: blood pressure returned to baseline and stable Postop Assessment: no apparent nausea or vomiting Anesthetic complications: no     Last Vitals:  Vitals:   04/03/18 0820 04/03/18 0922  BP: (!) 153/73 118/64  Pulse: 74 77  Resp: 20 16  Temp: (!) 35.9 C (!) 36.2 C    Last Pain:  Vitals:   04/03/18 1001  TempSrc:   PainSc: 0-No pain                 Lenard Simmer

## 2018-04-04 ENCOUNTER — Encounter: Payer: Self-pay | Admitting: Gastroenterology

## 2018-04-04 LAB — SURGICAL PATHOLOGY

## 2018-04-05 ENCOUNTER — Encounter: Payer: Self-pay | Admitting: Gastroenterology

## 2020-09-23 ENCOUNTER — Other Ambulatory Visit: Payer: Self-pay

## 2020-09-23 ENCOUNTER — Encounter: Payer: Self-pay | Admitting: Internal Medicine

## 2020-09-23 ENCOUNTER — Ambulatory Visit (INDEPENDENT_AMBULATORY_CARE_PROVIDER_SITE_OTHER): Payer: No Typology Code available for payment source | Admitting: Internal Medicine

## 2020-09-23 VITALS — BP 116/50 | HR 73 | Temp 97.5°F | Ht 69.0 in | Wt 235.8 lb

## 2020-09-23 DIAGNOSIS — G4733 Obstructive sleep apnea (adult) (pediatric): Secondary | ICD-10-CM

## 2020-09-23 DIAGNOSIS — Z87891 Personal history of nicotine dependence: Secondary | ICD-10-CM | POA: Diagnosis not present

## 2020-09-23 DIAGNOSIS — J449 Chronic obstructive pulmonary disease, unspecified: Secondary | ICD-10-CM

## 2020-09-23 NOTE — Progress Notes (Signed)
[facility] Pulmonary Medicine Consultation      Date: 09/23/2020,   MRN# [id number] Jeffrey Burgess January 29, 1946     AdmissionWeight: 235 lb 12.8 oz (107 kg)                 CurrentWeight: 235 lb 12.8 oz (107 kg) Jeffrey Burgess is a 75 y.o. old male seen in consultation for COPD at the request of Clance.     CHIEF COMPLAINT:   Assessment for COPD Assessment for tobacco abuse Assessment of  sleep apnea   HISTORY OF PRESENT ILLNESS   75 yo white male seen today for assessment for COPD Patient started smoking when he was 75 years old Smokes about 2 packs a day now smokes 1 pack/week Patient has chronic shortness of breath over the last 10 years chronic dyspnea exertion over the last 10 years Patient currently takes Symbicort and Spiriva This seems to help his symptoms tremendously, the [place] has stopped paying for his Symbicort and Spiriva therefore switched to other inhalers which do not help the patient  Patient has progressive shortness of breath and dyspnea exertion that is worsening over the last several months  Patient does not have any pets at home On occasion his daughter's dog visits 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 I also advised patient on behavioral therapy and can provide oral medication therapy in conjunction with the other therapies Follow up next Office visit  for assessment of smoking cessation Smoking cessation counseling advised for 4 minutes  No exacerbation at this time No evidence of heart failure at this time No evidence or signs of infection at this  time No respiratory distress No fevers, chills, nausea, vomiting, diarrhea No evidence of lower extremity edema No evidence hemoptysis     Patient has DX of OSA We will need [facility] for further assessment  Encouraged proper weight management.  Discussed driving precautions and its relationship with hypersomnolence.  Discussed operating dangerous equipment and its relationship with hypersomnolence.  Discussed sleep hygiene, and benefits of a fixed sleep waked time.  The importance of getting eight or more hours of sleep discussed with patient.  Discussed limiting the use of the computer and television before bedtime.  Decrease naps during the day, so night time sleep will become enhanced.  Limit caffeine, and sleep deprivation.  HTN, stroke, and heart failure are potential risk factors.   Discussed risk of untreated sleep apnea including cardiac arrhthymias, stroke, DM, pulm HTN.        PAST MEDICAL HISTORY   Past Medical History:  Diagnosis Date   Asthma    COPD (chronic obstructive pulmonary disease) (HCC)    Diabetes (HCC)    Dyspnea    Hyperlipidemia    Hypertension    Myocardial infarction (HCC)    Rheumatoid arthritis (HCC)    Sleep apnea      SURGICAL HISTORY   Past Surgical History:  Procedure Laterality Date   BACK SURGERY     CARDIAC CATHETERIZATION     COLONOSCOPY WITH PROPOFOL N/A 04/03/2018   Procedure: COLONOSCOPY WITH  PROPOFOL;  Surgeon: Wyline Mood, MD;  Location: [facility] ENDOSCOPY;  Service: Gastroenterology;  Laterality: N/A;   ELBOW ARTHROCENTESIS     ROTATOR CUFF REPAIR Right      FAMILY HISTORY   No family history on file.   SOCIAL HISTORY   Social History   Tobacco Use   Smoking status: Every Day    Packs/day: 0.25    Years: 57.00    Pack years: 14.25    Types: Cigarettes   Smokeless tobacco: Former   Tobacco comments:    reports smoking 1 cig each morning.  Substance Use Topics   Alcohol use: Never   Drug use: Never      MEDICATIONS    Home Medication:  Current Outpatient Rx   Order #: [id number] Class: Historical Med   Order #: [id number] Class: Historical Med   Order #: [id number] Class: Historical Med   Order #: [id number] Class: Historical Med   Order #: [id number] Class: Historical Med   Order #: [id number] Class: Historical Med   Order #: [id number] Class: Historical Med   Order #: [id number] Class: Historical Med   Order #: [id number] Class: Historical Med   Order #: [id number] Class: Historical Med   Order #: [id number] Class: Historical Med   Order #: [id number] Class: Historical Med   Order #: [id number] Class: Historical Med   Order #: [id number] Class: Historical Med   Order #: [id number] Class: Historical Med   Order #: [id number] Class: Historical Med   Order #: [id number] Class: Historical Med   Order #: [id number] Class: Historical Med   Order #: [id number] Class: Historical Med   Order #: [id number] Class: Historical Med   Order #: [id number] Class: Historical Med   Order #: [id number] Class: Historical Med   Order #: [id number] Class: Historical Med   Order #: [id number] Class: Historical Med   Order #: [id number] Class: Historical Med   Order #: [id number] Class: Historical Med   Order #: [id number] Class: Historical Med   Order #: [id number] Class: Historical Med   Order #: [id number] Class: Historical Med   Order #: [id number] Class: Historical Med   Order #: [id number] Class: Historical Med   Order #: [id number] Class: Historical Med   Order #: [id number] Class: Historical Med   Order #: [id number] Class: Historical Med    Current Medication:  Current Outpatient Medications:    amLODipine (NORVASC) 5 MG tablet, Take 5 mg by mouth daily., Disp: , Rfl:    busPIRone (BUSPAR) 10 MG tablet, Take 10 mg by mouth 3 (three) times daily. , Disp: , Rfl:    cetirizine (ZYRTEC) 10 MG tablet, Take 10 mg by mouth daily., Disp: , Rfl:    EPINEPHrine 0.3 mg/0.3 mL IJ SOAJ injection, Inject 0.3 mg into the muscle as needed for anaphylaxis., Disp: , Rfl:     insulin aspart protamine - aspart (NOVOLOG 70/30 MIX) (70-30) 100 UNIT/ML FlexPen, Inject 18 Units into the skin., Disp: , Rfl:    losartan (COZAAR) 100 MG tablet, Take 100 mg by mouth daily., Disp: , Rfl:    metFORMIN (GLUCOPHAGE) 500 MG tablet, Take 500 mg by mouth 2 (two) times daily after a meal. , Disp: , Rfl:    metoprolol succinate (TOPROL-XL) 100 MG 24 hr tablet, Take 100 mg by mouth daily. Take with or immediately following a meal., Disp: , Rfl:    montelukast (SINGULAIR) 10 MG tablet, Take 10 mg by mouth., Disp: , Rfl:    nitroGLYCERIN (NITROSTAT) 0.4 MG SL tablet, Place 0.4 mg under the tongue., Disp: , Rfl:    pregabalin (LYRICA) 100 MG capsule, Take 200 mg by mouth every morning. , Disp: , Rfl:  ranitidine (ZANTAC) 150 MG capsule, Take 150 mg by mouth 2 (two) times daily. , Disp: , Rfl:    rosuvastatin (CRESTOR) 20 MG tablet, Take 20 mg by mouth., Disp: , Rfl:    Tiotropium Bromide-Olodaterol (STIOLTO RESPIMAT) 2.5-2.5 MCG/ACT AERS, Inhale into the lungs., Disp: , Rfl:    acetaminophen (TYLENOL) 500 MG tablet, Take 500 mg by mouth. (Patient not taking: Reported on 09/23/2020), Disp: , Rfl:    albuterol (VENTOLIN HFA) 108 (90 Base) MCG/ACT inhaler, Inhale 2 puffs into the lungs. (Patient not taking: Reported on 09/23/2020), Disp: , Rfl:    aspirin EC 81 MG tablet, Take 81 mg by mouth. (Patient not taking: Reported on 09/23/2020), Disp: , Rfl:    beclomethasone (BECONASE-AQ) 42 MCG/SPRAY nasal spray, Place 2 sprays into the nose. (Patient not taking: Reported on 09/23/2020), Disp: , Rfl:    benzonatate (TESSALON) 200 MG capsule, Take 200 mg by mouth 3 (three) times daily as needed for cough. (Patient not taking: Reported on 09/23/2020), Disp: , Rfl:    budesonide-formoterol (SYMBICORT) 160-4.5 MCG/ACT inhaler, Inhale 2 puffs into the lungs. (Patient not taking: Reported on 09/23/2020), Disp: , Rfl:    buPROPion (WELLBUTRIN) 100 MG tablet, Take 100 mg by mouth. (Patient not taking: Reported on  09/23/2020), Disp: , Rfl:    Cholecalciferol 50 MCG (2000 UT) TABS, Take 2,000 Units by mouth. (Patient not taking: Reported on 09/23/2020), Disp: , Rfl:    docusate sodium (COLACE) 100 MG capsule, Take 100 mg by mouth. (Patient not taking: Reported on 09/23/2020), Disp: , Rfl:    fluticasone (FLONASE) 50 MCG/ACT nasal spray, Place 1 spray into the nose. (Patient not taking: Reported on 09/23/2020), Disp: , Rfl:    glucose blood test strip, 1 each by Other route as needed for other. Use as instructed (Patient not taking: Reported on 09/23/2020), Disp: , Rfl:    lisinopril (PRINIVIL,ZESTRIL) 10 MG tablet, Take 10 mg by mouth daily. (Patient not taking: Reported on 09/23/2020), Disp: , Rfl:    metoprolol tartrate (LOPRESSOR) 25 MG tablet, Take 25 mg by mouth. (Patient not taking: Reported on 09/23/2020), Disp: , Rfl:    nicotine polacrilex (COMMIT) 2 MG lozenge, Place inside cheek. (Patient not taking: Reported on 09/23/2020), Disp: , Rfl:    Omega-3 1000 MG CAPS, Take 2 g by mouth. (Patient not taking: Reported on 09/23/2020), Disp: , Rfl:    OXcarbazepine (TRILEPTAL) 150 MG tablet, Take 150 mg by mouth 2 (two) times daily., Disp: , Rfl:    pramipexole (MIRAPEX) 0.5 MG tablet, Take 0.5 mg by mouth. (Patient not taking: Reported on 09/23/2020), Disp: , Rfl:    tiotropium (SPIRIVA) 18 MCG inhalation capsule, Place 18 mcg into inhaler and inhale. (Patient not taking: Reported on 09/23/2020), Disp: , Rfl:    VITAMIN B COMPLEX-C CAPS, Take 1 tablet by mouth. (Patient not taking: Reported on 09/23/2020), Disp: , Rfl:    vitamin E 400 UNIT capsule, Take 400 Units by mouth. (Patient not taking: Reported on 09/23/2020), Disp: , Rfl:     ALLERGIES   Iodinated diagnostic agents and Sulfamethoxazole     REVIEW OF SYSTEMS     Review of Systems:  Gen:  Denies  fever, sweats, chills weight loss  HEENT: Denies blurred vision, double vision, ear pain, eye pain, hearing loss, nose bleeds, sore throat Cardiac:  No dizziness,  chest pain or heaviness, chest tightness,edema, No JVD Resp:   No cough, -sputum production, +shortness of breath,-wheezing, -hemoptysis,  Gi: Denies swallowing difficulty, stomach  pain, nausea or vomiting, diarrhea, constipation, bowel incontinence Gu:  Denies bladder incontinence, burning urine Ext:   Denies Joint pain, stiffness or swelling Skin: Denies  skin rash, easy bruising or bleeding or hives Endoc:  Denies polyuria, polydipsia , polyphagia or weight change Psych:   Denies depression, insomnia or hallucinations  Other:  All other systems negative    VS: BP (!) 116/50 (BP Location: Left Arm, Patient Position: Sitting, Cuff Size: Normal)   Pulse 73   Temp (!) 97.5 F (36.4 C) (Oral)   Ht 5\' 9"  (1.753 m)   Wt 235 lb 12.8 oz (107 kg)   SpO2 95%   BMI 34.82 kg/m     Physical Examination:   General Appearance: No distress  EYES PERRLA, EOM intact.   NECK Supple, No JVD Pulmonary: normal breath sounds, No wheezing.  CardiovascularNormal S1,S2.  No m/r/g.   Abdomen: Benign, Soft, non-tender. Skin:   warm, no rashes, no ecchymosis  Extremities: normal, no cyanosis, clubbing. Neuro:without focal findings,  speech normal  PSYCHIATRIC: Mood, affect within normal limits.   ALL OTHER ROS ARE NEGATIVE       ASSESSMENT/PLAN   75 year old pleasant white male seen today for assessment for COPD, OSA, tobacco abuse, lung cancer screening protocol  Regarding COPD Office spirometry shows an FEV1 of 51% predicted Findings suggest moderate COPD Patient previously treated on Symbicort and Spiriva however [facility] not no [facility] and switch to other alternative inhalers which do not work After further assessment I strongly recommend that patient continue on Symbicort and Spiriva which helped his symptoms significantly  Shortness of breath and dyspnea on exertion Patient will need 6-minute walk test to assess for exertional hypoxia  OSA Patient will  need to sign release forms to obtain medical records Will take over sleep apnea management once we get records Patient is advised to continue his CPAP on a nightly basis  Encouraged proper weight management.  Important to get eight or more hours of sleep  Limiting the use of the computer and television before bedtime.  Decrease naps during the day, so night time sleep will become enhanced.  Limit caffeine, and sleep deprivation.  HTN, stroke, uncontrolled diabetes and heart failure are potential risk factors.  Risk of untreated sleep apnea including cardiac arrhthymias, stroke, DM, pulm HTN.   Extensive smoking history Recommend smoking cessation   Lung cancer screening protocol Recommend annual CT scan of his chest   Obesity -recommend significant weight loss -recommend changing diet  Deconditioned state -Recommend increased daily activity and exercise   MEDICATION ADJUSTMENTS/LABS AND TESTS ORDERED: OBTAIN 61 REFERRAL TO LUNG CANCER SCREENING PROGRAM  PATIENT NEEDS SYMBICORT AND SPIRIVA AS PREVIOUSLY PRESCRIBED   CURRENT MEDICATIONS REVIEWED AT LENGTH WITH PATIENT TODAY   Patient  satisfied with Plan of action and management. All questions answered  Follow up 6 months  Total Time Spent  48 mins   Wallis Bamberg, M.D.  [facility]  Medical Director [facility] Medical Director [facility] Cardio-Pulmonary Department

## 2020-09-23 NOTE — Patient Instructions (Addendum)
OBTAIN REFERRAL TO LUNG CANCER SCREENING PROGRAM  PATIENT NEEDS SYMBICORT AND SPIRIVA AS PREVIOUSLY PRESCRIBED

## 2020-10-01 ENCOUNTER — Telehealth: Payer: Self-pay | Admitting: Internal Medicine

## 2020-10-01 NOTE — Addendum Note (Signed)
Addended by: Katrinka Blazing R on: 10/01/2020 10:52 AM   Modules accepted: Orders

## 2020-10-02 MED ORDER — BUDESONIDE-FORMOTEROL FUMARATE 160-4.5 MCG/ACT IN AERO: 2.0000 | INHALATION_SPRAY | Freq: Two times a day (BID) | RESPIRATORY_TRACT | 11 refills | Status: DC

## 2020-10-02 MED ORDER — TIOTROPIUM BROMIDE MONOHYDRATE 18 MCG IN CAPS: 18.0000 ug | ORAL_CAPSULE | Freq: Every day | RESPIRATORY_TRACT | 11 refills | Status: DC

## 2020-10-02 NOTE — Telephone Encounter (Signed)
Called and spoke with wife Bonita Quin to let her know that I have prescriptions ready and the last office note I am just waiting on Dr. Belia Heman to come and sign the prescriptions and then I will fax it over to the [place] for them. She expressed understanding. Nothing further needed at this time.

## 2020-11-03 ENCOUNTER — Other Ambulatory Visit: Payer: Self-pay | Admitting: *Deleted

## 2020-11-03 DIAGNOSIS — F17210 Nicotine dependence, cigarettes, uncomplicated: Secondary | ICD-10-CM

## 2020-11-20 ENCOUNTER — Telehealth: Payer: Self-pay | Admitting: Acute Care

## 2020-11-20 NOTE — Telephone Encounter (Signed)
Pt. Needs to keep SDMV as it is scheduled , and we need to push scan out about another week so it is at least 1 year out from his previous scan. Thanks so much

## 2020-11-24 NOTE — Telephone Encounter (Signed)
Spoke with pt's wife per [facility] regarding pt's appts for lung cancer screening. Pt has decided to get low dose CT at [place] this year on 12/22/20 and will decide if he wants to switch to our program next year. Pt will have CT results faxed to Dr Belia Heman once it is complete. Nothing further needed at this time.

## 2020-11-24 NOTE — Telephone Encounter (Signed)
See other telephone note from 11/20/20.

## 2020-12-03 ENCOUNTER — Ambulatory Visit: Payer: Non-veteran care

## 2020-12-03 ENCOUNTER — Encounter: Payer: Non-veteran care | Admitting: Acute Care

## 2021-04-03 ENCOUNTER — Telehealth: Payer: Self-pay

## 2021-04-03 NOTE — Telephone Encounter (Signed)
Called and LVM ikn regards to upcoming appt, if patient is using CPAP please have patient bring SD card.

## 2021-04-07 ENCOUNTER — Ambulatory Visit (INDEPENDENT_AMBULATORY_CARE_PROVIDER_SITE_OTHER): Payer: No Typology Code available for payment source | Admitting: Adult Health

## 2021-04-07 ENCOUNTER — Encounter: Payer: Self-pay | Admitting: Adult Health

## 2021-04-07 ENCOUNTER — Other Ambulatory Visit: Payer: Self-pay

## 2021-04-07 DIAGNOSIS — Z9989 Dependence on other enabling machines and devices: Secondary | ICD-10-CM | POA: Diagnosis not present

## 2021-04-07 DIAGNOSIS — Z720 Tobacco use: Secondary | ICD-10-CM | POA: Diagnosis not present

## 2021-04-07 DIAGNOSIS — G4733 Obstructive sleep apnea (adult) (pediatric): Secondary | ICD-10-CM | POA: Diagnosis not present

## 2021-04-07 DIAGNOSIS — J449 Chronic obstructive pulmonary disease, unspecified: Secondary | ICD-10-CM | POA: Diagnosis not present

## 2021-04-07 NOTE — Assessment & Plan Note (Signed)
Smoking cessation discussed 

## 2021-04-07 NOTE — Patient Instructions (Signed)
Continue on Dulera 2 puffs Twice daily   Continue on Spiriva daily  Albuterol inhaler As needed   Activity as tolerated  Keep working on quitting smoking  Continue on CPAP At bedtime   Follow up with Dr. Belia Heman in 6 months and As needed

## 2021-04-07 NOTE — Assessment & Plan Note (Signed)
Continue on CPAP At bedtime    Plan  Patient Instructions  Continue on Dulera 2 puffs Twice daily   Continue on Spiriva daily  Albuterol inhaler As needed   Activity as tolerated  Keep working on quitting smoking  Continue on CPAP At bedtime   Follow up with Dr. Belia Heman in 6 months and As needed

## 2021-04-07 NOTE — Assessment & Plan Note (Signed)
Compensated on present regimen  Ccontinue with yearly LDCT  Smoking cessation is key.  Continue with Dulera/Spiriva ([facility])   Plan  Patient Instructions  Continue on Dulera 2 puffs Twice daily   Continue on Spiriva daily  Albuterol inhaler As needed   Activity as tolerated  Keep working on quitting smoking  Continue on CPAP At bedtime   Follow up with Dr. Belia Heman in 6 months and As needed

## 2021-04-07 NOTE — Progress Notes (Signed)
@Patient  ID: Jeffrey Burgess, male    DOB: 1945/11/08, 76 y.o.   MRN: [id number]  Chief Complaint  Patient presents with   Follow-up    Referring provider: [facility]  HPI: 76 year old male active smoker followed for COPD Medical history significant for sleep apnea [facility], diabetes, hypertension, coronary artery disease  TEST/EVENTS :  09/2020 Office spirometry shows an FEV1 of 51% predicted  04/07/2021 Follow up ; COPD  Patient returns for 64-month follow-up.  Patient has underlying moderate to severe COPD.  He continues smoke.  He gets meds through [place] . Recently changed from Symbicort and Spiriva.  To Dulera and Spiriva handihaler.  Feels they do not work as well . He says overall breathing is doing okay . Gets winded with heavy activities. Has low activity tolerance.  He participates with the Lung cancer CT screening at the [place] . Per patient , CT stable, gets yearly scan in October. They brought copy of 2021  CT report showed stable pulmonary nodules.  Lives at home with wife. Drives . Does light yard work. [employer]. Has small garden. Cant walk long distance. No significant cough .  Has cut back on smoking , went to cessation class, really trying to quit.   Has OSA , on CPAP . Wears all night long . Feels he benefits . Cant sleep without it. Feels rested. Uses full face mask.  [facility] follows [facility].    Allergies  Allergen Reactions   Iodinated Contrast Media Anaphylaxis and Swelling   Sulfamethoxazole Anaphylaxis    As a child    Immunization History  Administered Date(s) Administered   Influenza-Unspecified 12/02/2016, 11/21/2017, 11/23/2018, 11/24/2020   Moderna Sars-Covid-2 Vaccination 03/21/2019, 04/18/2019, 12/17/2019, 08/12/2020    Past Medical History:  Diagnosis Date   Asthma    COPD (chronic obstructive pulmonary disease) ([place])    Diabetes ([employer])    Dyspnea    Hyperlipidemia    Hypertension    Myocardial infarction ([place])     Rheumatoid arthritis ([place])    Sleep apnea     Tobacco History: Social History   Tobacco Use  Smoking Status Every Day   Packs/day: 2.00   Years: 57.00   Pack years: 114.00   Types: Cigarettes  Smokeless Tobacco Former  Tobacco Comments   smoking 1 cig daily-04/07/2021   Ready to quit: No Counseling given: Yes Tobacco comments: smoking 1 cig daily-04/07/2021   Outpatient Medications Prior to Visit  Medication Sig Dispense Refill   acetaminophen (TYLENOL) 500 MG tablet Take 500 mg by mouth.     albuterol (VENTOLIN HFA) 108 (90 Base) MCG/ACT inhaler Inhale 2 puffs into the lungs.     amLODipine (NORVASC) 5 MG tablet Take 5 mg by mouth daily.     aspirin EC 81 MG tablet Take 81 mg by mouth.     beclomethasone (BECONASE-AQ) 42 MCG/SPRAY nasal spray Place 2 sprays into the nose.     benzonatate (TESSALON) 200 MG capsule Take 200 mg by mouth 3 (three) times daily as needed for cough.     busPIRone (BUSPAR) 10 MG tablet Take 10 mg by mouth 3 (three) times daily.      cetirizine (ZYRTEC) 10 MG tablet Take 10 mg by mouth daily.     Cholecalciferol 50 MCG (2000 UT) TABS Take 2,000 Units by mouth.     docusate sodium (COLACE) 100 MG capsule Take 100 mg by mouth.     EPINEPHrine 0.3 mg/0.3 mL IJ SOAJ  injection Inject 0.3 mg into the muscle as needed for anaphylaxis.     fluticasone (FLONASE) 50 MCG/ACT nasal spray Place 1 spray into the nose.     glucose blood test strip 1 each by Other route as needed for other. Use as instructed     insulin aspart protamine - aspart (NOVOLOG 70/30 MIX) (70-30) 100 UNIT/ML FlexPen Inject 10 Units into the skin.     lisinopril (PRINIVIL,ZESTRIL) 10 MG tablet Take 10 mg by mouth daily.     losartan (COZAAR) 100 MG tablet Take 100 mg by mouth daily.     metFORMIN (GLUCOPHAGE) 500 MG tablet Take 500 mg by mouth 2 (two) times daily after a meal.      metoprolol succinate (TOPROL-XL) 100 MG 24 hr tablet Take 100 mg by mouth daily. Take with or immediately  following a meal.     metoprolol tartrate (LOPRESSOR) 25 MG tablet Take 25 mg by mouth.     mometasone-formoterol (DULERA) 100-5 MCG/ACT AERO Inhale 2 puffs into the lungs 2 (two) times daily.     montelukast (SINGULAIR) 10 MG tablet Take 10 mg by mouth.     nicotine polacrilex (COMMIT) 2 MG lozenge Place inside cheek.     nitroGLYCERIN (NITROSTAT) 0.4 MG SL tablet Place 0.4 mg under the tongue.     Omega-3 1000 MG CAPS Take 2 g by mouth.     OXcarbazepine (TRILEPTAL) 150 MG tablet Take 150 mg by mouth 2 (two) times daily.     pramipexole (MIRAPEX) 0.5 MG tablet Take 0.5 mg by mouth.     pregabalin (LYRICA) 100 MG capsule Take 200 mg by mouth every morning.      ranitidine (ZANTAC) 150 MG capsule Take 150 mg by mouth 2 (two) times daily.      rosuvastatin (CRESTOR) 20 MG tablet Take 20 mg by mouth.     tiotropium (SPIRIVA) 18 MCG inhalation capsule Place 1 capsule (18 mcg total) into inhaler and inhale daily. 30 capsule 11   VITAMIN B COMPLEX-C CAPS Take 1 tablet by mouth.     vitamin E 400 UNIT capsule Take 400 Units by mouth.     budesonide-formoterol (SYMBICORT) 160-4.5 MCG/ACT inhaler Inhale 2 puffs into the lungs 2 (two) times daily. 1 each 11   buPROPion (WELLBUTRIN) 100 MG tablet Take 100 mg by mouth.     No facility-administered medications prior to visit.     Review of Systems:   Constitutional:   No  weight loss, night sweats,  Fevers, chills,  +fatigue, or  lassitude.  HEENT:   No headaches,  Difficulty swallowing,  Tooth/dental problems, or  Sore throat,                No sneezing, itching, ear ache, nasal congestion, post nasal drip,   CV:  No chest pain,  Orthopnea, PND, swelling in lower extremities, anasarca, dizziness, palpitations, syncope.   GI  No heartburn, indigestion, abdominal pain, nausea, vomiting, diarrhea, change in bowel habits, loss of appetite, bloody stools.   Resp: .  No chest wall deformity  Skin: no rash or lesions.  GU: no dysuria, change in  color of urine, no urgency or frequency.  No flank pain, no hematuria   MS:  No joint pain or swelling.  No decreased range of motion.  No back pain.    Physical Exam  BP 124/60 (BP Location: Left Arm, Cuff Size: Large)    Pulse 74    Temp 97.8 F (36.6 C) (  Temporal)    Ht 5\' 9"  (1.753 m)    Wt 247 lb (112 kg)    SpO2 97%    BMI 36.48 kg/m   GEN: A/Ox3; pleasant , NAD, well nourished    HEENT:  [place]/AT,   NOSE-clear, THROAT-clear, no lesions, no postnasal drip or exudate noted. Class 3 MP airway   NECK:  Supple w/ fair ROM; no JVD; normal carotid impulses w/o bruits; no thyromegaly or nodules palpated; no lymphadenopathy.    RESP  Clear  P & A; w/o, wheezes/ rales/ or rhonchi. no accessory muscle use, no dullness to percussion  CARD:  RRR, no m/r/g, tr-1 + peripheral edema, pulses intact, no cyanosis or clubbing.  GI:   Soft & nt; nml bowel sounds; no organomegaly or masses detected.   Musco: Warm bil, no deformities or joint swelling noted.   Neuro: alert, no focal deficits noted.    Skin: Warm, no lesions or rashes    Lab Results:  CBC No results found for: WBC, RBC, HGB, HCT, PLT, MCV, MCH, MCHC, RDW, LYMPHSABS, MONOABS, EOSABS, BASOSABS  BMET No results found for: NA, K, CL, CO2, GLUCOSE, BUN, CREATININE, CALCIUM, GFRNONAA, GFRAA  BNP No results found for: BNP  ProBNP No results found for: PROBNP  Imaging: No results found.    No flowsheet data found.  No results found for: NITRICOXIDE      Assessment & Plan:   COPD (chronic obstructive pulmonary disease) ([place]) Compensated on present regimen  Ccontinue with yearly LDCT  Smoking cessation is key.  Continue with Dulera/Spiriva ([facility])   Plan  Patient Instructions  Continue on Dulera 2 puffs Twice daily   Continue on Spiriva daily  Albuterol inhaler As needed   Activity as tolerated  Keep working on quitting smoking  Continue on CPAP At bedtime   Follow up with Dr. Mortimer Fries in 6 months and As needed        Tobacco abuse Smoking cessation discussed   OSA on CPAP Continue on CPAP At bedtime    Plan  Patient Instructions  Continue on Dulera 2 puffs Twice daily   Continue on Spiriva daily  Albuterol inhaler As needed   Activity as tolerated  Keep working on quitting smoking  Continue on CPAP At bedtime   Follow up with Dr. Mortimer Fries in 6 months and As needed         Rexene Edison, NP 04/07/2021

## 2024-01-31 ENCOUNTER — Inpatient Hospital Stay
Admission: RE | Admit: 2024-01-31 | Discharge: 2024-01-31 | Disposition: A | Payer: Self-pay | Source: Ambulatory Visit | Attending: Pulmonary Disease

## 2024-01-31 ENCOUNTER — Other Ambulatory Visit: Payer: Self-pay

## 2024-01-31 DIAGNOSIS — Z9289 Personal history of other medical treatment: Secondary | ICD-10-CM

## 2024-02-01 ENCOUNTER — Ambulatory Visit: Admitting: Pulmonary Disease

## 2024-02-01 ENCOUNTER — Encounter: Payer: Self-pay | Admitting: Pulmonary Disease

## 2024-02-01 ENCOUNTER — Inpatient Hospital Stay
Admission: RE | Admit: 2024-02-01 | Discharge: 2024-02-01 | Disposition: A | Discharge status: Home / Self Care | Payer: Self-pay | Source: Ambulatory Visit | Attending: Pulmonary Disease | Admitting: Pulmonary Disease

## 2024-02-01 VITALS — BP 110/60 | HR 75 | Temp 98.4°F | Ht 69.0 in | Wt 217.6 lb

## 2024-02-01 DIAGNOSIS — Z9289 Personal history of other medical treatment: Secondary | ICD-10-CM

## 2024-02-01 DIAGNOSIS — R911 Solitary pulmonary nodule: Secondary | ICD-10-CM

## 2024-02-01 NOTE — Progress Notes (Signed)
 Synopsis: Referred in by Johnie No, PA-C   Subjective:   PATIENT ID: Jeffrey Burgess GENDER: male DOB: June 11, 1945, MRN: [id number]  Chief Complaint  Patient presents with   Lung Mass    CT on 11/6. Cough, dry. No wheezing. DOE. Albuterol- 1-2 times a day.    HPI Discussed the use of AI scribe software for clinical note transcription with the patient, who gave verbal consent to proceed.  History of Present Illness   Jeffrey Burgess is a 78 year old male with COPD who presents for evaluation of a lung nodule. He was referred for evaluation of a lung nodule.  He experiences difficulty with breathing and uses an albuterol inhaler as an emergency inhaler, typically in the morning and sometimes at night. He was recently started on roflumilast. He has not been hospitalized in the past year but has required short courses of prednisone for bronchitis, most recently two weeks ago, and has had two or three courses in the past year.  He has a history of a lung nodule, currently measuring 6 millimeters on the left side. The nodule has been present since at least 2018 and has shown some growth. He continues to smoke one to two cigarettes a day. There is no family history of lung cancer, although his father had stomach cancer. He experiences a cough with very little phlegm and shortness of breath during physical activity, which requires him to take breaks. No chest pain. He is concerned about undergoing anesthesia due to a previous bad experience where he woke up in intensive care.        History reviewed. No pertinent family history.   Social History   Socioeconomic History   Marital status: Married    Spouse name: Not on file   Number of children: Not on file   Years of education: Not on file   Highest education level: Not on file  Occupational History   Not on file  Tobacco Use   Smoking status: Every Day    Current packs/day: 2.00    Average packs/day: 2.0 packs/day for 57.0 years  (114.0 ttl pk-yrs)    Types: Cigarettes   Smokeless tobacco: Former   Tobacco comments:    Smokes 2-3 cigarettes daily- khj 02/01/2024        Started smoking at 78 years old    Smoked 2PPD at his heaviest  Substance and Sexual Activity   Alcohol use: Never   Drug use: Never   Sexual activity: Not on file  Other Topics Concern   Not on file  Social History Narrative   Not on file   Social Drivers of Health   Financial Resource Strain: Not on file  Food Insecurity: Not on file  Transportation Needs: Not on file  Physical Activity: Not on file  Stress: Not on file  Social Connections: Not on file  Intimate Partner Violence: Not on file        Objective:   Vitals:   02/01/24 1125  BP: 110/60  Pulse: 75  Temp: 98.4 F (36.9 C)  SpO2: 93%  Weight: 217 lb 9.6 oz (98.7 kg)  Height: 5' 9 (1.753 m)   93% on RA BMI Readings from Last 3 Encounters:  02/01/24 32.13 kg/m  04/07/21 36.48 kg/m  09/23/20 34.82 kg/m   Wt Readings from Last 3 Encounters:  02/01/24 217 lb 9.6 oz (98.7 kg)  04/07/21 247 lb (112 kg)  09/23/20 235 lb 12.8 oz (107 kg)  Physical Exam GEN: NAD HEENT: Supple Neck, Reactive Pupils, EOMI  CVS: Normal S1, Normal S2, RRR, No murmurs or ES appreciated  Lungs: Clear bilateral air entry.  Abdomen: Soft, non tender, non distended, + BS  Extremities: Warm and well perfused, No edema   Labs and imaging were reviewed.  Ancillary Information   CBC No results found for: WBC, RBC, HGB, HCT, PLT, MCV, MCH, MCHC, RDW, LYMPHSABS, MONOABS, EOSABS, BASOSABS       No data to display           Assessment & Plan:  Assessment and Plan    #Multiple lung nodules Most concerning a 6 mm left lung nodule slowly growing. Prefers to avoid biopsy due to past anesthesia issues. Small size and slow growth suggest non-aggressive approach. Monitoring preferred due to COPD and anesthesia complications. - Ordered repeat CT scan in 6  months to monitor nodule growth. - Will review previous CT scans for comparison. - If nodule shows growth, will discuss biopsy options, including transthoracic approach.   #Chronic obstructive pulmonary disease COPD well-managed with current inhaler regimen. Uses albuterol as emergency inhaler and another inhaler twice daily. Stable symptoms with exertional dyspnea, no chest pain. - Continue current inhaler regimen with triple therapy and albuterol as needed. - Recently started on Roflumilast.   Return in about 6 months (around 08/01/2024) for with the CT chest.  I personally spent a total of 60 minutes in the care of the patient today including preparing to see the patient, getting/reviewing separately obtained history, performing a medically appropriate exam/evaluation, counseling and educating, placing orders, documenting clinical information in the EHR, independently interpreting results, and communicating results.   Darrin Barn, MD [facility] 02/01/2024 12:32 PM
